# Patient Record
Sex: Female | Born: 1950
Health system: Southern US, Community
[De-identification: ages and names within clinical notes are randomized; demographics above are authoritative.]

## PROBLEM LIST (undated history)

## (undated) DIAGNOSIS — I472 Ventricular tachycardia: Secondary | ICD-10-CM

## (undated) DIAGNOSIS — Q2112 Patent foramen ovale: Secondary | ICD-10-CM

## (undated) DIAGNOSIS — Q211 Atrial septal defect: Secondary | ICD-10-CM

## (undated) DIAGNOSIS — R55 Syncope and collapse: Secondary | ICD-10-CM

## (undated) DIAGNOSIS — I10 Essential (primary) hypertension: Secondary | ICD-10-CM

## (undated) DIAGNOSIS — I4729 Other ventricular tachycardia: Secondary | ICD-10-CM

## (undated) DIAGNOSIS — F0390 Unspecified dementia without behavioral disturbance: Secondary | ICD-10-CM

## (undated) DIAGNOSIS — F329 Major depressive disorder, single episode, unspecified: Secondary | ICD-10-CM

## (undated) DIAGNOSIS — I4581 Long QT syndrome: Secondary | ICD-10-CM

## (undated) DIAGNOSIS — C50919 Malignant neoplasm of unspecified site of unspecified female breast: Secondary | ICD-10-CM

## (undated) DIAGNOSIS — M1990 Unspecified osteoarthritis, unspecified site: Secondary | ICD-10-CM

## (undated) DIAGNOSIS — F32A Depression, unspecified: Secondary | ICD-10-CM

## (undated) DIAGNOSIS — Q245 Malformation of coronary vessels: Secondary | ICD-10-CM

## (undated) DIAGNOSIS — I480 Paroxysmal atrial fibrillation: Secondary | ICD-10-CM

## (undated) HISTORY — PX: MASTECTOMY: SHX3

## (undated) HISTORY — PX: BREAST SURGERY: SHX581

## (undated) HISTORY — PX: ABDOMINAL HYSTERECTOMY: SHX81

## (undated) HISTORY — PX: TOTAL KNEE ARTHROPLASTY: SHX125

## (undated) HISTORY — PX: AUGMENTATION MAMMAPLASTY: SUR837

## (undated) HISTORY — PX: BREAST BIOPSY: SHX20

---

## 1984-02-06 DIAGNOSIS — C50919 Malignant neoplasm of unspecified site of unspecified female breast: Secondary | ICD-10-CM

## 1984-02-06 HISTORY — DX: Malignant neoplasm of unspecified site of unspecified female breast: C50.919

## 2004-06-06 ENCOUNTER — Other Ambulatory Visit: Admission: RE | Admit: 2004-06-06 | Discharge: 2004-06-06 | Payer: Self-pay | Admitting: Family Medicine

## 2004-07-10 ENCOUNTER — Encounter: Admission: RE | Admit: 2004-07-10 | Discharge: 2004-07-10 | Payer: Self-pay | Admitting: Family Medicine

## 2004-10-02 ENCOUNTER — Encounter: Admission: RE | Admit: 2004-10-02 | Discharge: 2004-10-02 | Payer: Self-pay | Admitting: Cardiology

## 2004-10-03 ENCOUNTER — Ambulatory Visit (HOSPITAL_COMMUNITY): Admission: RE | Admit: 2004-10-03 | Discharge: 2004-10-03 | Payer: Self-pay | Admitting: Cardiology

## 2004-10-12 ENCOUNTER — Ambulatory Visit: Payer: Self-pay | Admitting: Cardiovascular Disease

## 2004-10-12 ENCOUNTER — Ambulatory Visit (HOSPITAL_COMMUNITY): Admission: RE | Admit: 2004-10-12 | Discharge: 2004-10-12 | Payer: Self-pay | Admitting: Interventional Cardiology

## 2004-10-13 ENCOUNTER — Emergency Department (HOSPITAL_COMMUNITY): Admission: EM | Admit: 2004-10-13 | Discharge: 2004-10-13 | Payer: Self-pay | Admitting: Emergency Medicine

## 2004-10-23 ENCOUNTER — Ambulatory Visit: Payer: Self-pay | Admitting: Critical Care Medicine

## 2004-11-09 ENCOUNTER — Encounter: Admission: RE | Admit: 2004-11-09 | Discharge: 2005-02-07 | Payer: Self-pay | Admitting: Internal Medicine

## 2005-02-15 ENCOUNTER — Encounter (INDEPENDENT_AMBULATORY_CARE_PROVIDER_SITE_OTHER): Payer: Self-pay | Admitting: *Deleted

## 2005-02-15 ENCOUNTER — Inpatient Hospital Stay (HOSPITAL_COMMUNITY): Admission: RE | Admit: 2005-02-15 | Discharge: 2005-02-17 | Payer: Self-pay | Admitting: Obstetrics and Gynecology

## 2005-04-02 ENCOUNTER — Encounter: Admission: RE | Admit: 2005-04-02 | Discharge: 2005-04-02 | Payer: Self-pay | Admitting: Obstetrics and Gynecology

## 2005-06-13 ENCOUNTER — Encounter: Admission: RE | Admit: 2005-06-13 | Discharge: 2005-06-13 | Payer: Self-pay | Admitting: Obstetrics and Gynecology

## 2005-08-29 ENCOUNTER — Encounter: Admission: RE | Admit: 2005-08-29 | Discharge: 2005-08-29 | Payer: Self-pay | Admitting: Internal Medicine

## 2005-09-07 ENCOUNTER — Encounter: Admission: RE | Admit: 2005-09-07 | Discharge: 2005-09-07 | Payer: Self-pay | Admitting: Internal Medicine

## 2006-01-14 ENCOUNTER — Encounter: Admission: RE | Admit: 2006-01-14 | Discharge: 2006-01-14 | Payer: Self-pay | Admitting: Obstetrics and Gynecology

## 2006-03-15 ENCOUNTER — Encounter: Admission: RE | Admit: 2006-03-15 | Discharge: 2006-03-15 | Payer: Self-pay | Admitting: Obstetrics and Gynecology

## 2006-03-25 ENCOUNTER — Encounter: Admission: RE | Admit: 2006-03-25 | Discharge: 2006-03-25 | Payer: Self-pay | Admitting: *Deleted

## 2006-04-29 ENCOUNTER — Inpatient Hospital Stay (HOSPITAL_COMMUNITY): Admission: EM | Admit: 2006-04-29 | Discharge: 2006-05-02 | Payer: Self-pay | Admitting: Emergency Medicine

## 2006-05-01 ENCOUNTER — Encounter (INDEPENDENT_AMBULATORY_CARE_PROVIDER_SITE_OTHER): Payer: Self-pay | Admitting: Interventional Cardiology

## 2006-11-06 ENCOUNTER — Inpatient Hospital Stay (HOSPITAL_COMMUNITY): Admission: RE | Admit: 2006-11-06 | Discharge: 2006-11-12 | Payer: Self-pay | Admitting: Orthopedic Surgery

## 2006-11-07 ENCOUNTER — Ambulatory Visit: Payer: Self-pay | Admitting: Physical Medicine & Rehabilitation

## 2007-01-15 ENCOUNTER — Encounter: Admission: RE | Admit: 2007-01-15 | Discharge: 2007-01-15 | Payer: Self-pay | Admitting: Obstetrics and Gynecology

## 2007-06-10 ENCOUNTER — Encounter: Admission: RE | Admit: 2007-06-10 | Discharge: 2007-06-10 | Payer: Self-pay | Admitting: Obstetrics and Gynecology

## 2008-06-21 ENCOUNTER — Ambulatory Visit (HOSPITAL_COMMUNITY): Admission: RE | Admit: 2008-06-21 | Discharge: 2008-06-21 | Payer: Self-pay | Admitting: Cardiology

## 2010-02-25 ENCOUNTER — Encounter: Payer: Self-pay | Admitting: Critical Care Medicine

## 2010-02-26 ENCOUNTER — Encounter: Payer: Self-pay | Admitting: Obstetrics and Gynecology

## 2010-02-26 ENCOUNTER — Encounter: Payer: Self-pay | Admitting: Internal Medicine

## 2010-02-27 ENCOUNTER — Encounter: Payer: Self-pay | Admitting: Cardiology

## 2010-06-14 ENCOUNTER — Inpatient Hospital Stay (HOSPITAL_BASED_OUTPATIENT_CLINIC_OR_DEPARTMENT_OTHER): Admission: RE | Admit: 2010-06-14 | Payer: Self-pay | Source: Ambulatory Visit | Admitting: Cardiology

## 2010-06-20 ENCOUNTER — Ambulatory Visit (HOSPITAL_COMMUNITY)
Admission: RE | Admit: 2010-06-20 | Discharge: 2010-06-20 | Disposition: A | Discharge status: Home / Self Care | Source: Ambulatory Visit | Attending: Cardiology | Admitting: Cardiology

## 2010-06-20 DIAGNOSIS — R0789 Other chest pain: Secondary | ICD-10-CM | POA: Insufficient documentation

## 2010-06-20 NOTE — Op Note (Signed)
NAMEBRIGHTON, Castro             ACCOUNT NO.:  [id number]   MEDICAL RECORD NO.:  [id number]          PATIENT TYPE:  INP   LOCATION:  0004                         FACILITY:  [facility]   PHYSICIAN:  Ollen Gross, M.D.    DATE OF BIRTH:  [birth year]   DATE OF PROCEDURE:  11/06/2006  DATE OF DISCHARGE:                               OPERATIVE REPORT   PREOPERATIVE DIAGNOSIS:  Osteoarthritis, bilateral knees.   POSTOPERATIVE DIAGNOSIS:  Osteoarthritis, bilateral knees.   PROCEDURE:  Bilateral total knee arthroplasty.   SURGEON:  Ollen Gross, M.D.   ASSISTANT:  Avel Peace, PA-C   ANESTHESIA:  General with postop epidural.   ESTIMATED BLOOD LOSS:  Minimal.   DRAIN:  Autovac right knee.   TOURNIQUET TIME:  Right knee 30 minutes 300 mmHg, left knee 29 minutes  300 mmHg.   COMPLICATIONS:  None.   CONDITION:  Stable to recovery.   CLINICAL NOTE:  [name] is a 60 year old female with severe end-  stage arthritis both knees.  Both equally symptomatic.  She had had a  progressive increase in pain and decrease in function in the past  several months.  We had attempted nonoperative intervention including  injections which have failed.  Due to significant intractable pain and  functional debility, she presents now for bilateral total knee  arthroplasty.  We discussed doing one at a time versus doing bilateral  and she opted for doing both on the same setting.   PROCEDURE IN DETAIL:  After successful administration of general and  epidural anesthetic, tourniquets were placed on both thighs and both  lower extremities  prepped and draped in the usual sterile fashion.  We  started with the right lower extremity as that historically was more  symptomatic.  The right lower extremity was wrapped in Esmarch, knee  flexed and tourniquet inflated 300 mmHg.  Midline incision is made with  a 10 blade through subcutaneous tissue to the level of the extensor  mechanism.  A fresh blade is  used to make a medial parapatellar  arthrotomy.  Soft tissue over the proximal medial tibia is  subperiosteally elevated to the joint line with the knife and into the  semimembranosus bursa with a Cobb elevator.  Soft tissue laterally is  elevated with attention being paid to avoiding patellar tendon on tibial  tubercle.  Patella subluxed laterally, knee flexed 90 degrees, ACL and  PCL removed.  Drill was used to create a starting hole in the distal  femur and the canal was thoroughly irrigated.  The 5 degree right valgus  alignment guide is placed referencing off the posterior condyles,  rotation is marked and the block pinned to remove 11 mm of distal femur.  I took 11 because of the flexion contracture.  Distal femoral resection  is made with an oscillating saw.  Sizing blocks placed, size 4 is most  appropriate in the AP plane but 3 is most appropriate in the medial  lateral plane.  Marked the rotation for size 4, but used a size 3  cutting block.  Rotations marked off the epicondylar axis.  Size  3  cutting block placed and the anterior-posterior chamfer cuts are made.   The tibia subluxed forward and the menisci are removed.  Extramedullary  tibial alignment guide is placed referencing proximally at the medial  aspect of the tibial tubercle and distally along the second metatarsal  axis and tibial crest.  Block is pinned to remove 10 mm of the non  deficient lateral side.  Tibial resection is made with an oscillating  saw.  Size 3 is the most appropriate tibial component and the proximal  tibia prepared the modular drill and keel punch for size 3.  Femoral  preparation is completed with the intercondylar cut.   Size 3 mobile bearing tibial trial, size 3 posterior stabilized femoral  trial and a 12.5 mm posterior stabilized rotating platform insert trial  are placed.  With the 12.5 full extension is achieved with excellent  varus and valgus balance throughout full range of motion.   The patella  was everted and thickness measured to be 22 mm.  Freehand resection  taken to 13 mm, 38 template is placed, lug holes were drilled, trial  patella was placed and it tracks normally.  Osteophytes were removed off  the posterior femur with the trial in place.  All trials were removed  and the cut bone surfaces are prepared with pulsatile lavage.  Cement  was mixed and once ready for implantation the size 3 mobile bearing  tibial tray, size 3 posterior stabilized femur and 38 patella are  cemented in place.  Patella was held with a clamp.  Trial 12.5-mm  inserts placed, knee held in full extension and all extruded cement  removed.  When the cement was fully hardened, the wound is copiously  irrigated with saline solution.  The trials removed and FloSeal injected  on the posterior capsule.  The permanent 12.5 mm posterior stabilized  rotating platform insert is then placed into the tibial tray.  We then  placed the FloSeal in the medial and lateral gutters in suprapatellar  area and the tourniquet released with total time of 30 minutes.  Moist  sponge is placed and held for about 2 minutes.  It is then removed and  minimal bleeding was encountered.  All bleeding is stopped with  electrocautery.  We irrigated again and closed the extensor mechanism  over Hemovac drain with interrupted #1 PDS.  Flexion against gravity was  135 degrees.  Subcu closed with interrupted 2-0 Vicryl.  The drain is  hooked to the Autovac suction.  Moist sponge is placed and we wrapped  the knee loosely in Esmarch.   I then addressed the left knee.  Left lower extremities wrapped in  Esmarch, knee flexed and tourniquet inflated 300 mmHg.  Same approach is  used for a midline incision.  Medial parapatellar arthrotomy was  performed and soft tissue over the proximal medial tibia is elevated to  the joint line with the knife and into the semimembranosus bursa with a  Cobb elevator.  We subluxed the patella  laterally, removed the ACL and  PCL and used a drill the create a starting hole in the distal femur.  Canal was thoroughly irrigated and a 5 degrees left valgus alignment  guide is placed.  We took 11 mm of the distal femur because of flexion  contracture.  Sizing blocks placed.  We had the same mismatches on the  right.  She was 4 in the AP plane and 3 in the medial lateral plane.  We  thus marked for size 4 holes but did the size 3 cutting block, rotated  it off the epicondylar axis.  The anterior-posterior and chamfer cuts  are made.   Tibia subluxed forward and menisci removed.  With the extramedullary  guide, we placed it to remove 10 mm of the non deficient lateral side.  Tibial resection is made with an oscillating saw.  Size three is most  appropriate tibial component.  Proximal tibia was prepared with a  modular drill and keel punch for size 3 and the femoral preparation is  completed with the intercondylar cut.   Size 3 mobile bearing tibial trial, size 3 posterior stabilized femoral  trial and a 12.5 mm posterior stabilized rotating platform insert trial  are placed.  With the 12/05 full extension is achieved with excellent  varus and valgus balance throughout full range of motion.  Patella was  everted and with the same preparation technique as on the right, we used  a 38 which tracked normally.  Osteophytes were removed off the posterior  femur with the trial in place.  All trials removed and cut bone surfaces  prepared with pulsatile lavage.  Cement was mixed and once ready for  implantation, the size 3 mobile bearing tibial tray, size 3 posterior  stabilized femur and 38 patella are cemented in place.  Patella was held  with a clamp.  Trial 12/05 inserts placed, knee held in full extension  and all extruded cement removed.  When cement fully hardened then the  wound was copiously irrigated saline solution.  FloSeal injected on the  posterior capsule and the permanent 12.5  mm posterior stabilized  rotating platform insert placed into the tibial tray.  The FloSeal  injected in the mediolateral gutters and suprapatellar area.  Moist  sponge is placed and tourniquet released for tourniquet time of 29  minutes.  Minor bleeding stopped with cautery once the sponges removed.  Wounds again irrigated and the arthrotomy closed over the Autovac drain  with interrupted #1 PDS.  The drain was in the inadvertently pulled  later with removal of the drapes.  Once the arthrotomy was closed in  flexion against gravity was 135 degrees.  The subcu closed with  interrupted 2-0 Vicryl subcuticular running 4-0 Monocryl.  We then  closed the subcuticular on the right knee with a 4-0 Monocryl.  Both  incisions cleaned and dried and Steri-Strips and bulky sterile dressings  applied.  She is placed into knee immobilizers, awakened and transferred  to recovery in stable condition.      Ollen Gross, M.D.  Electronically Signed     FA/MEDQ  D:  11/06/2006  T:  11/07/2006  Job:  [id number]

## 2010-06-20 NOTE — H&P (Signed)
Michelle Castro, Michelle Castro             ACCOUNT NO.:  [id number]   MEDICAL RECORD NO.:  [id number]          PATIENT TYPE:  INP   LOCATION:  0004                         FACILITY:  [facility]   PHYSICIAN:  Ollen Gross, M.D.    DATE OF BIRTH:  1951-01-23   DATE OF ADMISSION:  11/06/2006  DATE OF DISCHARGE:                              HISTORY & PHYSICAL   DATE OF OFFICE VISIT HISTORY AND PHYSICAL:  October 31, 2006.   CHIEF COMPLAINT:  Bilateral knee pain.   HISTORY OF PRESENT ILLNESS:  The patient is a 60 year old female whose  been seen by Dr. Lequita Halt in second opinion earlier this year for  bilateral knee pain that has been ongoing for quite some time now. It  has progressively gotten worse over the past year and a half and it is  limiting what she can and cannot do.  She is seen in the office as a  second opinion, found to have end-stage medial compartment arthritis of  both knees, right is slightly worse than the left.  She also has  patellofemoral arthritis noted.  She has been treated conservatively in  the past for her knee pain including injections.  She has been  refractory to conservative measurements. It is felt she would benefit  from undergoing surgical intervention.  The risks and benefits have been  discussed. She has elected to proceed with bilateral total knee  arthroplasties.   ALLERGIES:  No known drug allergies.   INTOLERANCES:  Codeine causes vomiting.   Please note the patient is able to take Percocet and Vicodin   CURRENT MEDICATIONS:  1. Diltiazem 240 mg.  2. Triamterene/hydrochlorothiazide 37.5/25.  3. Generic Ambien 10 mg.  4. Tramadol 50 mg.  5. Potassium chloride 20 mEq.  6. Sertraline 50 mg.  7. Warfarin 5 mg.  8. Metoprolol 25 mg.   PAST MEDICAL HISTORY:  Migraines, anxiety, hypertension, breast cancer,  osteoarthritis, postmenopausal.   PAST SURGICAL HISTORY:  Exploratory surgery, breast surgery secondary to  breast cancer, left breast  mastectomy, right breast biopsy and breast  augmentation procedures.   SOCIAL HISTORY:  Divorced, works in [employer], nonsmoker. Social intake of  alcohol. Four children.  Her daughter will be assisting with care after  surgery.   FAMILY HISTORY:  Father with history of prostate cancer.  Mother with  history of rheumatoid arthritis.  Sister with history of breast cancer.  She has two aunts both with a history of cancer. One of those aunts also  has heart disease. Grandfather with bone cancer.  Grandmother with brain  aneurysm.   REVIEW OF SYSTEMS:  GENERAL:  No fevers, chills or night sweats.  NEUROLOGIC:  No seizures, syncope or paralysis.  RESPIRATORY:  No  shortness of breath, productive cough or hemoptysis.  CARDIOVASCULAR:  No chest pain, angina or orthopnea. GI:  Some constipation related to  medications. No diarrhea, no nausea or vomiting.  GU:  No dysuria,  hematuria or discharge.  MUSCULOSKELETAL:  Bilateral knees.   PHYSICAL EXAMINATION:  VITAL SIGNS:  Pulse 60, respirations 12, blood  pressure 138/78.  GENERAL:  A 60 year old, white  female, well-nourished, well-developed,  in no acute distress.  She is alert, oriented and cooperative, very  pleasant, mildly anxious at time of exam.  HEENT:  Normocephalic, atraumatic.  Pupils are round and reactive.  Oropharynx clear.  EOMs intact.  NECK:  Supple.  CHEST:  Clear anterior and posterior chest walls.  No rhonchi, rales or  wheezing.  HEART:  Regular rhythm with a faint early systolic ejection murmur noted  over the left sternal border.  ABDOMEN:  Soft, slightly round.  Bowel sounds present.  RECTAL/BREASTS/GENITALIA:  Not done not pertinent to present illness.  EXTREMITIES:  Bilateral knees. The left knee shows range of motion of 5-  120, medial more tender than lateral with marked crepitus noted.  Right  knee shows range of motion 0 to 125, marked crepitus, tender medial and  lateral.   IMPRESSION:  1. Osteoarthritis  bilateral knees.  2. Migraines.  3. Anxiety.  4. Hypertension.  5. History of breast cancer.  6. Postmenopausal.   PLAN:  The patient will be admitted to [facility] to undergo  bilateral total knee replacement arthroplasty.  The surgery will be  performed by Dr. Ollen Gross. She has been seen preoperatively by Dr.  Armanda Magic and felt stable and cleared from a cardiac standpoint for  procedure.      [name], P.A.C.      Ollen Gross, M.D.  Electronically Signed    ALP/MEDQ  D:  11/05/2006  T:  11/06/2006  Job:  [id number]   cc:   Armanda Magic, M.D.  Fax: [phone]

## 2010-06-23 NOTE — H&P (Signed)
[name], [name]             ACCOUNT NO.:  [id number]   MEDICAL RECORD NO.:  [id number]          PATIENT TYPE:  EMS   LOCATION:  MAJO                         FACILITY:  [facility]   PHYSICIAN:  Elmore Guise., M.D.DATE OF BIRTH:  [birth year]   DATE OF ADMISSION:  04/29/2006  DATE OF DISCHARGE:                              HISTORY & PHYSICAL   INDICATION FOR ADMISSION:  Chest pain and palpitations.   PRIMARY CARE PHYSICIAN:  Dr. Cline Cools.   PRIMARY CARDIOLOGIST:  Dr. Armanda Magic.   HISTORY OF PRESENT ILLNESS:  [name] is a very pleasant 60-year-  old white female with past medical history of paroxysmal atrial  fibrillation, hypertension, depression who presents for admission.  The  patient actually reports normal state of health until Thursday.  At that  time, she was having paroxysms of atrial fibrillation with her heart  racing and skipping and lasting anywhere from 30 minutes to one hour at  a time.  Other than her palpitations, she had been doing well until  today when she started having episodes of her blood pressure increasing.  She states she would check her blood pressure and sometimes it would be  in the 140/100 range, sometimes in the 170/100.  She took an extra dose  of her triamterene HCTZ and went to her primary physician's office for  evaluation.  Prior to going, she started having substernal chest  tightness which she describes as a pressure and heaviness like a brick  sitting on my chest.  This was associated with palpitations which  worsened with exertion and improved with rest.  This went off and on  until she went to be evaluated.  There she had an ECG performed.  This  was faxed for cardiology over read.  Because of ECG changes, the patient  was then sent to the emergency room for evaluation.  She left her  primary care physician's office somewhere around 4 o'clock, arrived to  the emergency room for evaluation and since has been treated and had  enzymes performed which have been negative.  She will now be admitted  for observation.   REVIEW OF SYSTEMS:  Otherwise negative blood per HPI.   CURRENT MEDICATIONS:  1. Cartia 240 mg daily.  2. Coumadin 5 mg 2 days a week and 7.5 mg 5 days.  3. Triamterene/HCTZ once daily.  4. Zoloft 50 mg daily.  5. Zyrtec once daily.  6. Also a stomach peel..   ALLERGIES:  CODEINE CAUSING NAUSEA AND VOMITING.   FAMILY HISTORY:  Positive for hypertension.   SOCIAL HISTORY:  She is single.  She has four grown children.  No  tobacco.  Does drink occasional alcohol.   PHYSICAL EXAMINATION:  VITAL SIGNS:  She is afebrile.  Blood pressure is  149/87, heart rate 71, showing normal sinus rhythm on telemetry, sat 95%  on room air.  GENERAL:  She is a very pleasant middle-aged white female, alert and  oriented x4.  No acute distress.  She has no JVD and no bruits.  LUNGS:  Clear.  HEART:  Regular with no significant murmur, gallops  or rubs.  ABDOMEN:  Soft, nontender, nondistended.  No rebound or guarding.  EXTREMITIES:  Warm 2+ pulses and no edema.   LABORATORY DATA:  Her blood work shows hemoglobin of 16.3 and INR 2.5,  BUN and creatinine of 28 and 1.2, potassium level of 2.9.  Myoglobin is  78 with an MB of 3.1 and troponin I less than 0.05.  She had a cath in  August2006 showing normal coronaries with possible takeoff of RCA.  This was followed by CT angiogram showing a superior takeoff of a right  dominant system with no significant coronary disease and an EF of 64%.  Her chest x-ray today shows cardiomegaly, but no acute cardiopulmonary  disease.  Her ECG shows normal sinus rhythm with septal Q-waves and  nonspecific ST-T wave changes in her inferolateral leads.  No old EKGs  are available for evaluation.  Her EKG done earlier today is also  unavailable for evaluation.   IMPRESSION:  1. Chest pain.  2. Palpitations.  3. Hypertension.  4. Hypokalemia.   PLAN:  The patient will be  admitted for observation.  She had serial cardiac  enzymes.  Because of her blood pressure issues, we will add low-dose ACE  inhibitor, lisinopril 10 mg once daily.  We will place her potassium.  Further measures per Dr. Armanda Magic.  We will also repeat her EKG in  the morning.  I do wonder if there is a spasm component secondary to her  normal CTA and normal cath done back in 2006.  DE is less likely with  her therapeutic INR, and her symptoms are atypical here for dissection      Elmore Guise., M.D.  Electronically Signed     TWK/MEDQ  D:  04/29/2006  T:  04/30/2006  Job:  [id number]   cc:   Armanda Magic, M.D.

## 2010-06-23 NOTE — Cardiovascular Report (Signed)
Michelle Castro, Michelle Castro             ACCOUNT NO.:  [id number]   MEDICAL RECORD NO.:  [id number]          PATIENT TYPE:  OIB   LOCATION:  2853                         FACILITY:  [facility]   PHYSICIAN:  Armanda Magic, M.D.     DATE OF BIRTH:  1950/06/02   DATE OF PROCEDURE:  10/03/2004  DATE OF DISCHARGE:  10/03/2004                              CARDIAC CATHETERIZATION   REFERRING PHYSICIAN:  Dr. Madison Hickman.   PROCEDURE:  1.  Left heart catheterization.  2.  Coronary angiography.  3.  Left ventriculography.   OPERATOR:  Armanda Magic, M.D.   INDICATIONS:  Chest pain, abnormal EKG, ST depression with chest pain.   COMPLICATIONS:  None.   IV ACCESS:  Via right femoral artery 6-French sheath.   INDICATION:  This is a very pleasant 60 year old white female who presented  with complaints of episodic exertional chest pain and palpitations, and wore  an event monitor which showed paroxysmal atrial fibrillation.  During  several of the episodes of chest pain though, she was in sinus rhythm and  would have transient horizontal ST-segment depression of at least 3 mm.  She  now presents for cardiac catheterization.   DESCRIPTION OF PROCEDURE:  The patient was brought to cardiac  catheterization laboratory in a fasting non-sedated state.  Informed consent  was obtained.  The patient was connected to continuous heart rate and pulse  oximetry monitoring and intermittent blood pressure monitoring.  The right  groin was prepped and draped in a sterile fashion.  1% Xylocaine was used  for local anesthesia.  Using the modified Seldinger technique, a 6-French  sheath was placed in the right femoral artery.  Under fluoroscopic guidance,  a 6-French JL4 catheter was placed in the left coronary artery.  Multiple  cine films were taken in 30-degree RAO and 40-degree LAO views.  This  catheter was then exchanged out over a guidewire for a 6-French JR4 catheter  which was placed under fluoroscopic  guidance in the right coronary artery.  Multiple cine films were taken in 30-degree RAO and 40-degree LAO views.  This catheter was then exchanged out over a guidewire for 6-French angled  pigtail catheter, which was placed under fluoroscopic guidance into the left  ventricular cavity.  Left ventriculography was performed in 30-degree RAO  view using a total of 30 mL of contrast at 15 mL per second.  The catheter  was then pulled back across the aortic valve with no significant gradient  noted.  At the end of the procedure, all catheters and sheaths were removed.  Manual compression was performed until adequate hemostasis was obtained.  The patient was transferred back to her room in stable condition.   RESULTS:  The left main coronary artery is widely patent and bifurcates into  the left anterior descending artery and left circumflex artery.  The left  anterior descending artery is widely patent throughout its course to the  apex, giving rise to 2 diagonal branches, both of which are widely patent.   The left circumflex is widely patent throughout its course, giving rise to 2  obtuse marginal branches,  both of which are widely patent.  The ongoing left  circumflex traverses the AV groove and is widely patent.   The right coronary artery originates off the left coronary cusp, but is  widely patent and bifurcates into a posterior descending artery and  posterior lateral artery, both of which are widely patent.   LV function shows normal LV systolic function with mild MR, aortic pressure  147/81 mmHg, LV pressure 139/71 mmHg.   ASSESSMENT:  1.  Normal coronary arteries with aberrant takeoff of the right coronary      artery off the left coronary cusp.  2.  Normal left ventricular function.  3.  Chest pain with questionable coronary vasospasm.  The patient did have 3      mm of horizontal ST segment depression with chest pain on the event      monitor.  She does have a history of  migraine headaches.   PLAN:  1.  Discharge to home after IV fluid and bedrest.  Start Coumadin 5 mg a day      for paroxysmal A fib.  She has a higher risk of cardioembolic events      with PAF, given her underlying hypertension, despite her young age.  2.  Coumadin Clinic next Tuesday, October 10, 2004.  3.  Start Cardizem CD 180 mg a day to suppress atrial fibrillation and      possible vasospasm.  4.  Follow up with me in 2 weeks.  5.  We are also going to obtain an outpatient CT angiogram of the chest to      rule out aberrant course of the right coronary artery between the aorta      and the pulmonary artery which could account for angina as well.  This      will be scheduled next week.      Armanda Magic, M.D.  Electronically Signed     TT/MEDQ  D:  10/04/2004  T:  10/04/2004  Job:  [id number]

## 2010-06-23 NOTE — Discharge Summary (Signed)
Michelle Castro             ACCOUNT NO.:  [id number]   MEDICAL RECORD NO.:  [id number]          PATIENT TYPE:  INP   LOCATION:  6533                         FACILITY:  [facility]   PHYSICIAN:  Armanda Magic, M.D.     DATE OF BIRTH:  11-17-1950   DATE OF ADMISSION:  04/29/2006  DATE OF DISCHARGE:  05/01/2006                               DISCHARGE SUMMARY   DISCHARGE DIAGNOSES:  1. Palpitations, resolved.  2. Potential premature ventricular contraction.  3. Hypertension, treated.  4. Hypokalemia, repleted.  5. Long-term medication use.   HISTORY OF PRESENT ILLNESS:  [name] is a 60 year old-female who  was admitted to [facility] on April 29, 2006, with chest pain, flush  palpitations.  She had a previous cardiac catheterization in 2006, that  showed normal coronaries.  On admission, she was noted to be hypokalemic  with potassium of 2.9.   Once her potassium was repleted she felt better and she had had some  PVCs and short bursts of NSVT/4 beats, but these symptoms regressed once  her potassium was repleted.   The patient had been on Maxzide at home and over the past couple of days  she had doubled her Maxzide dose because her blood pressure was elevated  when she checked it.  It was [place] and she did not want to go to the  doctor.   She is now being discharged to home with stable laboratory work that  shows a potassium of 4.2, BUN 17, creatinine 0.8.  Of note, her TSH was  3.206 and magnesium was 2.1.  Her cardiac exoenzymes were negative.   DISCHARGE MEDICATIONS:  Include:  1. Continuing Coumadin as prior to admission.  2. Cardia 240 mg a day.  3. Maxzide 1 tablet daily.  4. Zoloft 50 mg daily.  5. Zyrtec p.r.n.  6. Vitamins daily.   MEDICATIONS:  1. Potassium 20 mEq one tablet a day.  2. Prinvil 10 mg one tablet a day.   ACTIVITY:  Increase activity slowly.   DIET:  Remain on a heart healthy diet.   PLAN:  Call for any further palpitations.  She or to  followup with Dr.  Armanda Magic on May 16, 2006, at 11 a.m.  She is to go to the  laboratory for stat BMET prior to this appointment.      Guy Franco, P.A.      Armanda Magic, M.D.  Electronically Signed    LB/MEDQ  D:  05/01/2006  T:  05/01/2006  Job:  [id number]   cc:   Armanda Magic, M.D.

## 2010-06-23 NOTE — Discharge Summary (Signed)
NAMEKINSIE, BELFORD             ACCOUNT NO.:  [id number]   MEDICAL RECORD NO.:  [id number]          PATIENT TYPE:  INP   LOCATION:  9306                          [facility]  [facility]   PHYSICIAN:  Gerald Leitz, MD          DATE OF BIRTH:  30-Mar-1950   DATE OF ADMISSION:  02/15/2005  DATE OF DISCHARGE:  02/17/2005                                 DISCHARGE SUMMARY   INDICATION FOR ADMISSION:  Symptomatic uterine fibroids, menorrhagia.   POSTOPERATIVE DIAGNOSIS:  Symptoms uterine fibroids, menorrhagia.   PROCEDURE:  Transvaginal hysterectomy with conversion to abdominal  laparotomy and bilateral salpingo-oophorectomy.   BRIEF HOSPITAL COURSE:  The patient was admitted on February 15, 2005 and  underwent a transvaginal hysterectomy which converted to an abdominal  laparotomy due to bleeding. Bilateral salpingo-oophorectomy was completed  through the laparotomy incision. The patient did well postoperatively. She  received routine postoperative care. She was discharged home on February 17, 2005 on the following medications. Discharge hemoglobin is 8.7.   DISCHARGE MEDICATIONS:  Motrin and Percocet.   CONDITION ON DISCHARGE:  Stable.   ACTIVITY:  Pelvic rest, otherwise ad lib.   FOLLOW UP:  To follow up in 2 weeks for postoperative visit, and in 3 days  for staple removal.   DIET:  Regular.      Gerald Leitz, MD  Electronically Signed     TC/MEDQ  D:  03/08/2005  T:  03/08/2005  Job:  [id number]

## 2010-06-23 NOTE — Op Note (Signed)
Michelle Castro, Michelle Castro             ACCOUNT NO.:  [id number]   MEDICAL RECORD NO.:  [id number]          PATIENT TYPE:  OBV   LOCATION:  9306                          [facility]   PHYSICIAN:  Gerald Leitz, MD          DATE OF BIRTH:  [birth year]   DATE OF PROCEDURE:  02/15/2005  DATE OF DISCHARGE:                                 OPERATIVE REPORT   PREOPERATIVE DIAGNOSES:  1.  Symptomatic uterine fibroids.  2.  Menorrhagia.   POSTOPERATIVE DIAGNOSES:  1.  Symptomatic uterine fibroids.  2.  Menorrhagia.   PROCEDURE:  Transvaginal hysterectomy with conversion to abdominal  laparotomy and bilateral salpingo-oophorectomy.   SURGEON:  Gerald Leitz, M.D.   ASSISTANT:  Bing Neighbors. Sydnee Cabal, M.D.   ANESTHESIA:  General.   COMPLICATIONS:  Complex left ovarian cyst with bleeding of the ovarian  pedicle that bled from the vaginal approach, could not be isolated and  repaired vaginally; therefore, there was conversion to an abdominal  laparotomy with a bilateral salpingo-oophorectomy and control of bleeding.   SPECIMENS:  Uterus, bilateral fallopian tubes and ovaries.   ESTIMATED BLOOD LOSS:  550 mL.   FINDINGS:  There was diffusely enlarged uterus, approximately 10 cm in size.  The left ovary appeared to be complex and have a complex cyst.  The right  ovary appeared normal.  All specimens were sent to pathology.   PROCEDURE:  The risks, benefits, indications and alternatives of the  procedure were reviewed with the patient.  Informed consent was obtained.  The patient was taken to the operating room, where she was placed under  general anesthesia.  She was placed in the dorsal lithotomy position,  prepped and draped in the usual sterile fashion.  A weighted speculum was  placed into the vagina and the cervix was grasped with a toothed tenaculum.  The cervix was then injected circumferentially with 1% Xylocaine with  1:100,000 epinephrine.  The cervix was then circumferentially incised  with a  scalpel and the bladder dissected off the pubovesical cervical fascia  anteriorly with the Metzenbaum scissors.  Attention was turned to the  posterior cul-de-sac, which was entered sharply with Mayo scissors without  difficulty.  At this point a Heaney clamp was placed over the uterosacral  ligament on each side.  These were then transected and suture ligated with 0  Vicryl.  Excellent hemostasis was noted.  Attention was returned to the  anterior cul-de-sac, which was entered sharply without difficulty.  The  cardinal ligaments were then clamped on both sides, transected and suture  ligated in a similar fashion.  The uterine arteries and the broad ligament  were then serially clamped with Heaney clamps, transected and suture ligated  on both sides.  Excellent hemostasis was noted.  An attempt was made to  clamp the left cornu with a Heaney clamp.  At this point the utero-ovarian  ligament became detached and retracted into the abdomen.  Attempts were made  to visualize the pedicle.  The ovary was grasped with the Babcock clamp.  It  was noted to be complex.  Bleeding was noted to be coming from a superior  location further up the infundibulopelvic ligament; however, this could not  be visualized vaginally despite several attempts.  Due to the bleeding and  lack of visualization, a decision was made to convert to abdominal  laparotomy to control hemostasis and then to remove the ovary bilaterally.  All instruments were removed from the patient's vagina.  Attention was then  turned to the abdomen, where a Pfannenstiel skin incision was made with a  scalpel and carried down to the underlying layer of fascia.  The fascia was  incised in the midline and the incision was extended laterally with Mayo  scissors.  The superior aspect of the fascial incision was grasped with  Kocher clamps, elevated and the underlying rectus muscles dissected off.  This was repeated on the inferior aspect  of the fascial incision.  The  peritoneum was identified and entered bluntly with good visualization of the  bladder.  This incision was extended superiorly and inferiorly with a  Metzenbaum scissors.  The Balfour retractor was placed and the bowel was  packed away with  moist laparotomy sponges.  The left ovary was identified  and noted to be bleeding from the infundibulopelvic ligament.  The ligament  was then clamped.  The ovary and fallopian tube were removed.  The  infundibulopelvic ligament was suture ligated with a free tie of 0 Vicryl,  followed by suture ligature.  At this point hemostasis was maintained.  Attention was turned to the right ovarian pedicle.  The right ovary was  visualized, infundibulopelvic ligament was clamped.  The right ovary and  fallopian tube were removed and the infundibulopelvic ligament was suture  ligated with a free tie of 0 Vicryl followed by a suture ligature of 0  Vicryl.  Attention was turned to the vaginal cuff.  The vaginal cuff angles  were closed with 0 Vicryl and transfixed to the uterosacral ligaments.  This  was done bilaterally.  The remainder of the cuff was closed with a series of  interrupted 0 Vicryl figure-of-eight sutures.  The pelvis was irrigated  copiously with warm normal saline.  There was slight bleeding at the vaginal  cuff, which was repaired with 2-0 Vicryl.  Excellent hemostasis was then  noted.  All laparotomy sponges and instruments were removed from the  abdomen.  The fascia was closed with 0 PDS in a running fashion.  The skin  was closed with staples.  Sponge, lap, needle and instrument counts were  correct x2.  The patient was taken to the recovery room awake and in stable  condition.      Gerald Leitz, MD  Electronically Signed     TC/MEDQ  D:  02/15/2005  T:  02/16/2005  Job:  [id number]

## 2010-06-23 NOTE — Discharge Summary (Signed)
[name], [name]             ACCOUNT NO.:  [id number]   MEDICAL RECORD NO.:  [id number]          PATIENT TYPE:  INP   LOCATION:  1605                         FACILITY:  [facility]   PHYSICIAN:  Ollen Gross, M.D.    DATE OF BIRTH:  16-May-1950   DATE OF ADMISSION:  11/06/2006  DATE OF DISCHARGE:  11/12/2006                               DISCHARGE SUMMARY   ADMITTING DIAGNOSES:  1. Osteoarthritis bilateral knees.  2. Migraines.  3. Anxiety.  4. Hypertension.  5. History breast cancer.  6. Postmenopausal.   DISCHARGE DIAGNOSES:  1. Postop blood loss anemia.  2. Postop hypokalemia improved.   1. Osteoarthritis bilateral knees.  2. Migraines.  3. Anxiety.  4. Hypertension.  5. History breast cancer.  6. Postmenopausal.   PROCEDURE:  November 06, 2006, bilateral total knee.  Surgeon Dr. Lequita Halt,  assistant Avel Peace PA-C.  General:  Postop epidural.  Tourniquet  time:  Right knee 30 minutes, left knee 29 minutes.   CONSULTS:  Dr. Ellwood Dense, Rehab Services.   BRIEF HISTORY:  Michelle Castro is a 60 year old female with end-stage  arthritis of both knees, but equally symptomatic, progressive pain  dysfunction, now presents for total knee arthroplasties.   LABORATORY DATA:  Preop CBC hemoglobin 12.4, hematocrit 36.5, white cell  count 6.8 down to 10.5, got as low as 8.5, drifted down a little bit  further to 8.1, came back up to 8.5.  Last H&H 8.4 and 24.1.  PT/PTT  preop 13.7 and 30, respectively.  INR 1.0.  Serial protimes follows:  PT/INR 18.6 and 1.5.  Chem panel on admission all within normal limits.  Serial B-mets were followed.  Potassium did drop 4.1-3.4 back up to 4.0.  Remaining B-mets within normal limits.  Preop UA negative.  Blood type A  negative.   EKG:  April 29, 2006, normal sinus rhythm, possible left atrial  enlargement, left axis deviation, septal infarct age of undetermined,  marked ST abnormality, possible inferior septal endocardial injury,  confirmed by Dr. Dione Booze.  No old tracing to compare.   HOSPITAL COURSE:  The patient was admitted to [facility],  tolerated procedure well, later transferred to recovery room on  orthopedic floor.  Started on epidural for anesthesia for postop pain.  She was given PCA also for breakthrough pain.  We were going to start  Coumadin on the evening of day #1, and her Lovenox 4-6 hours after the  epidural was to come out.  Started back on her home medications.  Pretty  decent control on day #1, actually doing very well.  By day #2,  unfortunately the epidural catheter came out around lunch time on day #1  and continue with the PCA and p.o. analgesics.  By day #2, she was still  doing pretty well with her pain control.  We added OxyContin and started  her Lovenox the evening before, since the epidural had been out over 6  hours, kept Lovenox bridging until the INR was therapeutic, kept her PCA  and the Foley on the morning of day #2, started getting up out of bed  and slowly progressing with physical therapy.  We ordered a rehab  consult.  The patient was seen in consultation by Dr. Ellwood Dense.  They felt with monitor that possibly would be able to progress and  possibly would not need inpatient rehab, but they did follow along.  From a therapy standpoint, started getting up out of bed, slowly  progressed, walking about 40 feet on day #2, and then up to 100 feet on  day #3.  Dressings changed on day #2 and both incisions were healing  very well, no signs of infection.  Continued to receive therapy daily  basis throughout the weekend with progressive performance by November 11, 2006, the following Monday, her hemoglobin was low, but she was  asymptomatic with this.  It got as low as 8.1 but was back up to 8.4.  She was not quite therapeutic on her INR, so we kept her another day to  monitor her symptoms.  By the following day her INR was improving.  She  was tolerating her  medications.  Her hemoglobin had stabilized.  Both  incisions looked good.  She was discharged home on November 12, 2006.   DISCHARGE/PLAN:  1. The patient was discharged home on November 12, 2006.  For discharge      diagnoses please see above.  2. Discharge medications:  Coumadin, OxyContin, Percocet, Robaxin,      Lovenox, Nu-Iron.   DISCHARGE INSTRUCTIONS:  1. Diet as tolerated.  2. Activity: She is weightbearing as tolerated both lower extremities.      Home Health PT and Home Health nursing.  Total knee protocol.      Follow-up 2 weeks from surgery.   DISPOSITION:  Home.   CONDITION ON DISCHARGE:  Improving.      [name], P.A.C.      Ollen Gross, M.D.  Electronically Signed    ALP/MEDQ  D:  11/26/2006  T:  11/27/2006  Job:  [id number]   cc:   Ollen Gross, M.D.  Fax: [phone]   Armanda Magic, M.D.  Fax: [phone]   Ellwood Dense, M.D.  Fax: [phone]

## 2010-06-23 NOTE — H&P (Signed)
Michelle Castro, Michelle Castro             ACCOUNT NO.:  [id number]   MEDICAL RECORD NO.:  [id number]          PATIENT TYPE:  AMB   LOCATION:  SDC                           [facility]ACILITY[facility]   PHYSICIAN:  Gerald Leitz, MD          DATE OF BIRTH:  [birth year]   DATE OF ADMISSION:  DATE OF DISCHARGE:                                HISTORY & PHYSICAL   She is scheduled for surgery on February 16, 2004.   HISTORY OF CURRENT ILLNESS:  This a 60 year old G4, P4, who was initially  referred by Dr. Madison Hickman at [facility] for the evaluation of  irregular vaginal bleeding.  The patient has been on Coumadin therapy for  atrial fibrillation for the past six weeks.  She was treated with Provera,  which initially stopped her bleeding, but she has since had very heavy  bleeding.  Had an ultrasound, and is noted to have uterine fibroids, as well  as a thickened endometrium.  She has had intermenstrual bleeding and an  endometrial biopsy was done.  Those results are pending at the time of this  dictation.  She is anemic with a hemoglobin of 10.3 on February 07, 2005, and  has had to change her pad every hour for approximately four days straight  feeling very weak and tired.  She desires definitive therapy.   PAST OBSTETRICS/GYNECOLOGY HISTORY:  Menarche at the age of 54.  Contraception BTL in 1977.  History of chlamydia treated years ago.  No  history of abnormal Pap smears.  Last Pap smear was normal was in the last  year.   PAST MEDICAL HISTORY:  1.  Atrial fibrillation treated by Dr. Mayford Knife at [facility].  2.  Hypertension.  3.  Arthritis.  4.  History of left breast cancer approximately 36 years ago.   PAST SURGICAL HISTORY:  Left breast mastectomy, left breast reconstruction,  right breast biopsy, diagnostic laparoscopy in 1986, tubal ligation in 1977.   OBSTETRIC HISTORY:  Spontaneous vaginal delivery x4.   SOCIAL HISTORY:  The patient works for [employer].  She is currently divorced.  She  denies tobacco or alcohol use.  No illicit drug use.   FAMILY HISTORY:  Sister with breast cancer diagnosed at 61.   MEDICATIONS:  Coumadin for atrial fibrillation, which she has discontinued  due to surgery.   ALLERGIES:  NO KNOWN DRUG ALLERGIES.   REVIEW OF SYSTEMS:  Negative, except as stated in history of current  illness.   PHYSICAL EXAMINATION:  VITAL SIGNS:  Blood pressure 120/76, heart rate 84.  CARDIOVASCULAR:  Regular rate and rhythm.  LUNGS:  Clear to auscultation bilaterally.  ABDOMEN:  Soft, nontender, and nondistended.  Positive bowel sounds.  No  masses appreciated.  EXTREMITIES:  No clubbing, cyanosis, or edema.  PELVIC EXAMINATION:  Normal external female genitalia.  No vulvar or  vaginal, or cervical lesions are noted.  There is a slight amount of blood  in the vaginal vault.  Bimanual exam, reveals approximately a 10 weeks' size  uterus.  No adnexal masses or tenderness.  RECTAL EXAMINATION:  Confirms.   ASSESSMENT AND PLAN:  Symptomatic uterine fibroids and menorrhagia.  All  options were discussed with the patient.  She desires to proceed with  transvaginal hysterectomy and bilateral salpingo-oophorectomy.  The risk,  benefits, and alternatives to surgery were discussed including infection,  bleeding, damage to surrounding organs such as the bowel and bladder with  the need for further surgery.  Risk of transfusion was discussed as well,  including HIV, hepatitis B, C, and transfusion reactions.  The patient  understands all the risk and wishes to proceed.      Gerald Leitz, MD  Electronically Signed     TC/MEDQ  D:  02/08/2005  T:  02/08/2005  Job:  [id number]   cc:   [facility]   Pre-Admissions Testing

## 2010-06-27 NOTE — Cardiovascular Report (Signed)
NAME:  LULA, KOLTON NO.:  [id number]  MEDICAL RECORD NO.:  [id number]           PATIENT TYPE:  O  LOCATION:  [facility]                         FACILITY:  [facility]  PHYSICIAN:  Armanda Magic, M.D.     DATE OF BIRTH:  [birth year]  DATE OF PROCEDURE:  06/20/2010 DATE OF DISCHARGE:  06/20/2010                           CARDIAC CATHETERIZATION   PROCEDURE:  Left heart catheterization, coronary angiography, left ventriculography.  OPERATOR:  Armanda Magic, MD  INDICATIONS:  Chest pain.  COMPLICATIONS:  None.  IV ACCESS:  Via right femoral artery 5-French sheath.  IV MEDICATIONS:  Versed 1 mg, fentanyl 25 mcg.  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  She now presents for cardiac catheterization.  The patient is brought to cardiac catheterization laboratory in fasting nonsedated state.  Informed consent was obtained.  The patient was connected to continuous heart rate and pulse oximetry monitoring and intermittent blood pressure monitoring.  The patient was sedated with 1 mg of Versed and 25 mcg of fentanyl.  The right groin was prepped and draped in sterile fashion.  Xylocaine 1% was used for local anesthesia. Using modified Seldinger technique, a 5-French sheath was placed in right femoral artery.  Under fluoroscopic guidance, a 5-French JL-4 catheter was placed in left coronary artery.  Multiple cine films were taken at 30-degree RAO and 40-degree LAO views.  This catheter was exchanged out over a guidewire for a 5-French JR-4 catheter which successfully engaged the right coronary ostium.  Multiple cine films were taken at 30-degree RAO and 40-degree LAO views.  This  catheter was then exchanged out over a guidewire for a 5-French angled pigtail catheter which was placed under fluoroscopic guidance in left ventricular cavity.  Left ventriculography was performed in a 30-degree RAO view using total 25 mL of contrast at 12 mL per second.  Catheter was then pulled back across the aortic valve with no significant gradient noted.  At the end of the procedure, all catheters and sheaths were removed.  Manual compression was performed until adequate hemostasis was obtained.  The patient was transferred back to room in stable condition.  RESULTS: 1. The left main coronary artery is widely patent and bifurcates into     left anterior descending artery and left circumflex artery.  1. Left anterior descending artery is widely patent throughout its     course at the apex.  It gives rise to a moderate-sized first     diagonal branch which was widely patent and a moderate to large     size second diagonal branch which bifurcates in 2 daughter branches     and is widely patent.  1. The left circumflex is widely patent throughout its course in the     AV groove.  It gives rise to a very high obtuse marginal 1 branch  which is large and widely patent.  It gives rise to a second obtuse     marginal branch which is moderate in size and widely patent and     terminates and a third obtuse marginal branch which is widely     patent.  1. The right coronary artery is widely patent throughout its course     and distally bifurcates into posterior descending artery and     posterolateral artery both of which are widely patent.  There is an     anterior takeoff to the right coronary artery off the right     coronary artery cusp.  Left ventriculography shows normal LV function, EF 55%, aortic pressure 121/69 mmHg, LV pressure 108/7 mmHg, LVEDP 10 mmHg.  ASSESSMENT: 1. Normal coronary arteries. 2. Normal left ventricular function. 3. Noncardiac chest pain.  PLAN:   Discharge home after IV fluid bedrest, will follow up with my nurse practitioner in 2 weeks in my office.     Armanda Magic, M.D.     TT/MEDQ  D:  06/20/2010  T:  06/20/2010  Job:  [id number]  Electronically Signed by Armanda Magic M.D. on 06/27/2010 01:41:14 PM

## 2010-11-16 LAB — HEMOGLOBIN AND HEMATOCRIT, BLOOD: HCT: 24.5 — ABNORMAL LOW

## 2010-11-16 LAB — TYPE AND SCREEN: ABO/RH(D): A NEG

## 2010-11-16 LAB — CBC
HCT: 24.1 — ABNORMAL LOW
HCT: 27.8 — ABNORMAL LOW
HCT: 29.3 — ABNORMAL LOW
HCT: 36.5
Hemoglobin: 8.4 — ABNORMAL LOW
Hemoglobin: 12.4
MCHC: 33.8
MCHC: 34.2
MCHC: 34.3
MCHC: 35.7
MCV: 84.3
MCV: 84.7
MCV: 84.8
MCV: 85.5
MCV: 84.7
Platelets: 245
Platelets: 246
Platelets: 411 — ABNORMAL HIGH
RBC: 3.47 — ABNORMAL LOW
RDW: 14.2 — ABNORMAL HIGH
RDW: 14.2 — ABNORMAL HIGH
RDW: 14.6 — ABNORMAL HIGH
RDW: 14.6 — ABNORMAL HIGH
WBC: 5.5
WBC: 6.8

## 2010-11-16 LAB — BASIC METABOLIC PANEL
BUN: 10
BUN: 8
BUN: 9
CO2: 24
CO2: 30
CO2: 30
Calcium: 8.7
Chloride: 100
Chloride: 99
Creatinine, Ser: 0.81
Creatinine, Ser: 1
GFR calc Af Amer: >60
GFR calc Af Amer: >60
GFR calc non Af Amer: 56 — ABNORMAL LOW
Glucose, Bld: 106 — ABNORMAL HIGH
Glucose, Bld: 97
Potassium: 3.4 — ABNORMAL LOW
Potassium: 3.7

## 2010-11-16 LAB — PROTIME-INR
INR: 1.1
INR: 1.1
INR: 1
Prothrombin Time: 14
Prothrombin Time: 14.2
Prothrombin Time: 16.8 — ABNORMAL HIGH
Prothrombin Time: 13.7

## 2010-11-16 LAB — URINALYSIS, ROUTINE W REFLEX MICROSCOPIC
Glucose, UA: NEGATIVE
Hgb urine dipstick: NEGATIVE
Protein, ur: NEGATIVE
pH: 7

## 2010-11-16 LAB — COMPREHENSIVE METABOLIC PANEL
Alkaline Phosphatase: 81
BUN: 12
CO2: 31
Chloride: 102
Creatinine, Ser: 1
GFR calc non Af Amer: 57 — ABNORMAL LOW
Glucose, Bld: 99
Potassium: 4.1
Total Bilirubin: 0.8

## 2010-11-16 LAB — APTT: aPTT: 30

## 2010-11-20 ENCOUNTER — Emergency Department (HOSPITAL_COMMUNITY)
Admission: EM | Admit: 2010-11-20 | Discharge: 2010-11-20 | Disposition: A | Discharge status: Home / Self Care | Attending: Emergency Medicine | Admitting: Emergency Medicine

## 2010-11-20 ENCOUNTER — Emergency Department (HOSPITAL_COMMUNITY)

## 2010-11-20 DIAGNOSIS — R279 Unspecified lack of coordination: Secondary | ICD-10-CM | POA: Insufficient documentation

## 2010-11-20 DIAGNOSIS — Z853 Personal history of malignant neoplasm of breast: Secondary | ICD-10-CM | POA: Insufficient documentation

## 2010-11-20 DIAGNOSIS — R42 Dizziness and giddiness: Secondary | ICD-10-CM | POA: Insufficient documentation

## 2010-11-20 DIAGNOSIS — I10 Essential (primary) hypertension: Secondary | ICD-10-CM | POA: Insufficient documentation

## 2010-11-20 DIAGNOSIS — Z79899 Other long term (current) drug therapy: Secondary | ICD-10-CM | POA: Insufficient documentation

## 2010-11-20 DIAGNOSIS — I4891 Unspecified atrial fibrillation: Secondary | ICD-10-CM | POA: Insufficient documentation

## 2010-11-20 DIAGNOSIS — Z7901 Long term (current) use of anticoagulants: Secondary | ICD-10-CM | POA: Insufficient documentation

## 2010-11-20 LAB — COMPREHENSIVE METABOLIC PANEL
ALT: 28 U/L (ref 0–35)
Alkaline Phosphatase: 101 U/L (ref 39–117)
GFR calc Af Amer: 64 mL/min — ABNORMAL LOW (ref >90)
Glucose, Bld: 118 mg/dL — ABNORMAL HIGH (ref 70–99)
Potassium: 4.2 mEq/L (ref 3.5–5.1)
Sodium: 139 mEq/L (ref 135–145)
Total Protein: 7.8 g/dL (ref 6.0–8.3)

## 2010-11-20 LAB — CBC
HCT: 37.1 % (ref 36.0–46.0)
Hemoglobin: 12.8 g/dL (ref 12.0–15.0)
MCH: 29.7 pg (ref 26.0–34.0)
MCHC: 34.5 g/dL (ref 30.0–36.0)
MCV: 86.1 fL (ref 78.0–100.0)

## 2010-11-20 LAB — GLUCOSE, CAPILLARY

## 2011-10-04 ENCOUNTER — Other Ambulatory Visit: Payer: Self-pay | Admitting: Family Medicine

## 2011-10-04 DIAGNOSIS — Z853 Personal history of malignant neoplasm of breast: Secondary | ICD-10-CM

## 2011-10-04 DIAGNOSIS — R921 Mammographic calcification found on diagnostic imaging of breast: Secondary | ICD-10-CM

## 2011-12-07 ENCOUNTER — Ambulatory Visit
Admission: RE | Admit: 2011-12-07 | Discharge: 2011-12-07 | Disposition: A | Discharge status: Home / Self Care | Source: Ambulatory Visit | Attending: Family Medicine | Admitting: Family Medicine

## 2011-12-07 ENCOUNTER — Other Ambulatory Visit: Payer: Self-pay | Admitting: Family Medicine

## 2011-12-07 DIAGNOSIS — Z853 Personal history of malignant neoplasm of breast: Secondary | ICD-10-CM

## 2011-12-07 DIAGNOSIS — R921 Mammographic calcification found on diagnostic imaging of breast: Secondary | ICD-10-CM

## 2012-02-06 DIAGNOSIS — I4581 Long QT syndrome: Secondary | ICD-10-CM

## 2012-02-06 HISTORY — DX: Long QT syndrome: I45.81

## 2012-02-29 ENCOUNTER — Inpatient Hospital Stay (HOSPITAL_COMMUNITY)
Admission: AD | Admit: 2012-02-29 | Discharge: 2012-03-06 | DRG: 227 | Disposition: A | Discharge status: Home / Self Care | Source: Ambulatory Visit | Attending: Cardiology | Admitting: Cardiology

## 2012-02-29 ENCOUNTER — Telehealth: Payer: Self-pay | Admitting: Cardiology

## 2012-02-29 ENCOUNTER — Encounter (HOSPITAL_COMMUNITY): Payer: Self-pay | Admitting: General Practice

## 2012-02-29 DIAGNOSIS — I519 Heart disease, unspecified: Secondary | ICD-10-CM

## 2012-02-29 DIAGNOSIS — G43909 Migraine, unspecified, not intractable, without status migrainosus: Secondary | ICD-10-CM | POA: Diagnosis present

## 2012-02-29 DIAGNOSIS — F329 Major depressive disorder, single episode, unspecified: Secondary | ICD-10-CM | POA: Diagnosis present

## 2012-02-29 DIAGNOSIS — I4729 Other ventricular tachycardia: Principal | ICD-10-CM | POA: Diagnosis present

## 2012-02-29 DIAGNOSIS — I119 Hypertensive heart disease without heart failure: Secondary | ICD-10-CM | POA: Diagnosis present

## 2012-02-29 DIAGNOSIS — I472 Ventricular tachycardia, unspecified: Principal | ICD-10-CM | POA: Diagnosis present

## 2012-02-29 DIAGNOSIS — Q2111 Secundum atrial septal defect: Secondary | ICD-10-CM

## 2012-02-29 DIAGNOSIS — R55 Syncope and collapse: Secondary | ICD-10-CM

## 2012-02-29 DIAGNOSIS — Q211 Atrial septal defect: Secondary | ICD-10-CM

## 2012-02-29 DIAGNOSIS — Z853 Personal history of malignant neoplasm of breast: Secondary | ICD-10-CM

## 2012-02-29 DIAGNOSIS — F3289 Other specified depressive episodes: Secondary | ICD-10-CM | POA: Diagnosis present

## 2012-02-29 DIAGNOSIS — F411 Generalized anxiety disorder: Secondary | ICD-10-CM | POA: Diagnosis present

## 2012-02-29 DIAGNOSIS — Z79899 Other long term (current) drug therapy: Secondary | ICD-10-CM

## 2012-02-29 DIAGNOSIS — I4891 Unspecified atrial fibrillation: Secondary | ICD-10-CM | POA: Diagnosis not present

## 2012-02-29 DIAGNOSIS — I10 Essential (primary) hypertension: Secondary | ICD-10-CM | POA: Diagnosis present

## 2012-02-29 DIAGNOSIS — Z7901 Long term (current) use of anticoagulants: Secondary | ICD-10-CM

## 2012-02-29 HISTORY — DX: Ventricular tachycardia: I47.2

## 2012-02-29 HISTORY — DX: Atrial septal defect: Q21.1

## 2012-02-29 HISTORY — DX: Malformation of coronary vessels: Q24.5

## 2012-02-29 HISTORY — DX: Patent foramen ovale: Q21.12

## 2012-02-29 HISTORY — DX: Other ventricular tachycardia: I47.29

## 2012-02-29 HISTORY — DX: Long QT syndrome: I45.81

## 2012-02-29 HISTORY — DX: Syncope and collapse: R55

## 2012-02-29 HISTORY — DX: Major depressive disorder, single episode, unspecified: F32.9

## 2012-02-29 HISTORY — DX: Essential (primary) hypertension: I10

## 2012-02-29 HISTORY — DX: Depression, unspecified: F32.A

## 2012-02-29 HISTORY — DX: Unspecified osteoarthritis, unspecified site: M19.90

## 2012-02-29 HISTORY — DX: Unspecified dementia, unspecified severity, without behavioral disturbance, psychotic disturbance, mood disturbance, and anxiety: F03.90

## 2012-02-29 HISTORY — DX: Malignant neoplasm of unspecified site of unspecified female breast: C50.919

## 2012-02-29 LAB — COMPREHENSIVE METABOLIC PANEL
ALT: 20 U/L (ref 0–35)
AST: 25 U/L (ref 0–37)
Albumin: 3.6 g/dL (ref 3.5–5.2)
Alkaline Phosphatase: 79 U/L (ref 39–117)
BUN: 24 mg/dL — ABNORMAL HIGH (ref 6–23)
CO2: 25 mEq/L (ref 19–32)
Chloride: 102 mEq/L (ref 96–112)
Creatinine, Ser: 0.94 mg/dL (ref 0.50–1.10)
GFR calc non Af Amer: 63 mL/min — ABNORMAL LOW (ref >90)
Sodium: 140 mEq/L (ref 135–145)
Total Bilirubin: 0.2 mg/dL — ABNORMAL LOW (ref 0.3–1.2)

## 2012-02-29 LAB — PROTIME-INR
INR: 1.82 — ABNORMAL HIGH (ref 0.00–1.49)
Prothrombin Time: 20.4 seconds — ABNORMAL HIGH (ref 11.6–15.2)

## 2012-02-29 LAB — CBC WITH DIFFERENTIAL/PLATELET
Basophils Absolute: 0 10*3/uL (ref 0.0–0.1)
Basophils Relative: 0 % (ref 0–1)
HCT: 37.4 % (ref 36.0–46.0)
Lymphocytes Relative: 43 % (ref 12–46)
MCHC: 34.5 g/dL (ref 30.0–36.0)
Monocytes Absolute: 0.6 10*3/uL (ref 0.1–1.0)
Neutro Abs: 3.6 10*3/uL (ref 1.7–7.7)
Neutrophils Relative %: 47 % (ref 43–77)
Platelets: 276 10*3/uL (ref 150–400)
RDW: 13.5 % (ref 11.5–15.5)
WBC: 7.6 10*3/uL (ref 4.0–10.5)

## 2012-02-29 LAB — TSH: TSH: 2.212 u[IU]/mL (ref 0.350–4.500)

## 2012-02-29 LAB — MRSA PCR SCREENING: MRSA by PCR: NEGATIVE

## 2012-02-29 LAB — APTT: aPTT: 40 seconds — ABNORMAL HIGH (ref 24–37)

## 2012-02-29 MED ORDER — POTASSIUM CHLORIDE CRYS ER 20 MEQ PO TBCR
40.0000 meq | EXTENDED_RELEASE_TABLET | Freq: Two times a day (BID) | ORAL | Status: AC
  Administered 2012-02-29 – 2012-03-01 (×2): 40 meq via ORAL
  Filled 2012-02-29 (×2): qty 2

## 2012-02-29 MED ORDER — ENOXAPARIN SODIUM 40 MG/0.4ML ~~LOC~~ SOLN
40.0000 mg | SUBCUTANEOUS | Status: DC
  Administered 2012-02-29 – 2012-03-04 (×5): 40 mg via SUBCUTANEOUS
  Filled 2012-02-29 (×6): qty 0.4

## 2012-02-29 MED ORDER — NITROGLYCERIN 0.4 MG SL SUBL: 0.4000 mg | SUBLINGUAL_TABLET | SUBLINGUAL | Status: DC | PRN

## 2012-02-29 MED ORDER — ONDANSETRON HCL 4 MG/2ML IJ SOLN: 4.0000 mg | Freq: Four times a day (QID) | INTRAMUSCULAR | Status: DC | PRN

## 2012-02-29 MED ORDER — ALPRAZOLAM 0.5 MG PO TABS: 1.0000 mg | ORAL_TABLET | Freq: Every evening | ORAL | Status: DC | PRN

## 2012-02-29 MED ORDER — ALPRAZOLAM 0.25 MG PO TABS
0.5000 mg | ORAL_TABLET | Freq: Three times a day (TID) | ORAL | Status: DC
  Administered 2012-02-29 – 2012-03-06 (×18): 0.5 mg via ORAL
  Filled 2012-02-29 (×7): qty 1
  Filled 2012-02-29: qty 2
  Filled 2012-02-29 (×6): qty 1
  Filled 2012-02-29: qty 2
  Filled 2012-02-29 (×3): qty 1

## 2012-02-29 MED ORDER — ACETAMINOPHEN 325 MG PO TABS
650.0000 mg | ORAL_TABLET | ORAL | Status: DC | PRN
  Administered 2012-03-02 – 2012-03-06 (×3): 650 mg via ORAL
  Filled 2012-02-29 (×3): qty 2

## 2012-02-29 MED ORDER — MAGNESIUM SULFATE 40 MG/ML IJ SOLN
2.0000 g | Freq: Once | INTRAMUSCULAR | Status: AC
  Administered 2012-02-29: 2 g via INTRAVENOUS
  Filled 2012-02-29: qty 50

## 2012-02-29 NOTE — H&P (Addendum)
HPI:  General:  The patient presents today for evaluation of arrhythmia noted on Lif[employer] monitor. She saw me on 1/22 for problems with dizziness, presyncope, syncope and palpitations. Because of her history of PAF I placed a Lifewatch monitor. Last PM she was noted to have wide complex tachcyardia at a rate of 258bpm which was c/w torsades. She had dizziness yesterday at the time of the arrhythmia. She denies any chest pain or SOB..        ROS:  See HPI, A twelve system review was perfomed at today's visit. For pertinent positives and negatives see HPI.       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 small ASD with no pulmonary hypertension.        Surgical History: left mastectomy 1986.        Family History: Father: deceased prostate CA Mother: deceased 54 yrs heart problem and rheumatoid arthritis Brother 1: alive Brother2: alive Sister 1: alive Sister 2: alive        Social History:  General:  History of smoking cigarettes: Never smoked.  no Smoking.  no Tobacco Exposure.  Alcohol: yes, social, 1-2 per week.  Caffeine: yes, 2 servings daily, coffee.  no Recreational drug use.  Marital Status: single.  Children: 4 children.        Medications: Sertraline HCl 100 mg Tablet 1 tablet Once a day, Zolpidem Tartrate 10 MG Tablet 1 tablet at bedtime Once a day, Tramadol 50 mg tablet one tab every 4-6 hours prn pain, Celebrex 200 MG Capsule 1 capsule as needed, Klor-Con M20 20 MEQ Tablet Extended Release 1 tab qd, PT NEEDS F/U APPT FOR MORE REFILLS. , Alprazolam 1 mg 1/2 tablet once at bedtime, Metoprolol Succinate 50 MG Tablet Extended Release 24 Hour 1 & 1/2 tablets Once a day, Triamterene-HCTZ 37.5-25 MG Tablet 1 tablet in the morning Once a day, Warfarin Sodium 5 MG Tablet 1 tablet daily  except 1 and 1/2 tablets on Friday Once a day, Medication List reviewed and reconciled with the patient       Allergies: Codeine (for allergy).       Objective:     Vitals: Wt 200.6, Wt change .8 lb, Ht 67, BMI 31.41, Pulse sitting 51, BP sitting 124/67.       Examination:  Cardiology, General:  GENERAL APPEARANCE: pleasant, NAD.  HEENT: unremarkable.  CAROTID UPSTROKE: normal, no bruit.  JVD: flat.  HEART SOUNDS: regular, normal S1, S2, no S3 or S4.  MURMUR: absent.  LUNGS: no rales or wheezes.  ABDOMEN: soft, non tender, positive bowel sounds, no masses felt.  EXTREMITIES: no leg edema.  PERIPHERAL PULSES: 2 plus bilateral.        Assessment:     Assessment:  1. Ventricular tachycardia, polymorphic - 427.1 (Primary), She appears to be having polymorphic VT which is causing presyncope and syncopal episodes. She had a cath in 2006 which showed normal coronary arteries. She has a history of mild LV dysfunction with EF 40% by echo in 2012 and also a history of PFO.  2. Benign hypertensive heart disease without heart failure - 402.10  3. Atrial fibrillation - 427.31  4. Ostium secundum type atrial septal defect - 745.5  5. Anticoagulant long-term use - V58.61    Plan:     1. Ventricular tachycardia, polymorphic  I have recommended  that she be admitted to CCU for further telemetry monitoring. I will check electrolytes since she is on a diuretic. I will go ahead and give her Magnesium sulfate 2gm IV. Stop Metoprolol given torsades. At this time her heart rate is normal so no indication for Isuprel at this time. I have spoken with Dr. Graciela Husbands who will see her in consultation. Stop Sertraline.  2.  After discussion with Dr. Graciela Husbands - will stop Coumadin for now given that patient's most likely arrhythmia in the past was NSVT and not PAF with aberration.            Provider: Armanda Magic, MD

## 2012-02-29 NOTE — Progress Notes (Signed)
ANTICOAGULATION CONSULT NOTE - Initial Consult  Pharmacy Consult for LMWH Indication: VTE prophylaxis  Allergies  Allergen Reactions  . Codeine     Patient Measurements: Height: 5\' 7"  (170.2 cm) Weight: 198 lb 4.8 oz (89.948 kg) IBW/kg (Calculated) : 61.6    Vital Signs: Temp: 97.8 F (36.6 C) (01/24 1600) Temp src: Oral (01/24 1600) BP: 91/42 mmHg (01/24 1700) Pulse Rate: 58  (01/24 1700)  Labs:  Basename 02/29/12 1600 02/29/12 1558  HGB -- 12.9  HCT -- 37.4  PLT -- 276  APTT -- 40*  LABPROT -- 20.4*  INR -- 1.82*  HEPARINUNFRC -- --  CREATININE -- 0.94  CKTOTAL -- --  CKMB -- --  TROPONINI <0.30 --    Estimated Creatinine Clearance: 70.5 ml/min (by C-G formula based on Cr of 0.94).   Medical History: Past Medical History  Diagnosis Date  . Hypertension   . Depression   . Anxiety   . Headache     MIGRAINES  . Cancer 1986    BREAST CANCER  . Arthritis   . Dementia   . Dysrhythmia     atrial fibrillation  . PFO (patent foramen ovale)   . 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 ALPRAZolam (XANAX) 1 MG tablet Take 1 mg by mouth at bedtime as needed. Take 1/2 tablet at bedtime      . celecoxib (CELEBREX) 200 MG capsule Take 200 mg by mouth 2 (two) times daily as needed.      . metoprolol succinate (TOPROL-XL) 50 MG 24 hr tablet Take 50 mg by mouth daily. Take 1 and 1/2 tablet daily      . potassium chloride SA (K-DUR,KLOR-CON) 20 MEQ tablet Take 20 mEq by mouth daily.      . sertraline (ZOLOFT) 100 MG tablet Take 100 mg by mouth daily.      . traMADol (ULTRAM) 50 MG tablet Take 50 mg by mouth every 6 (six) hours as needed.      . triamterene-hydrochlorothiazide (MAXZIDE-25) 37.5-25 MG per tablet Take 1 tablet by mouth daily.      Marland Kitchen warfarin (COUMADIN) 5 MG tablet Take 5 mg by mouth as directed.      . zolpidem (AMBIEN) 10 MG tablet  Take 10 mg by mouth at bedtime as needed.        Assessment: Michelle Castro is a 62 yo F on coumadin PTA for Afib. This has been stopped as there is no evidence of afib.  She will be started on LMWH for VTE prophylaxis.  Her INR is 1.82 and her CBC is WNL.  Her wt is ~ 90 kg and her creat cl is ~ 70 ml/min.  Goal of Therapy: prevent VTE Monitor platelets by anticoagulation protocol: Yes   Plan:  LMWH 40 mg sq q24 Herby Abraham, Pharm.D. [phone] 02/29/2012 5:56 PM

## 2012-02-29 NOTE — Consult Note (Addendum)
ELECTROPHYSIOLOGY CONSULT NOTE  Patient ID: Michelle Castro MRN: [id number], DOB/AGE: [birth year]   Admit date: 02/29/2012 Date of Consult: 02/29/2012  Primary Physician: None currently Primary Cardiologist: Armanda Magic, MD Reason for Consultation: Syncope, VT  History of Present Illness Michelle Castro is a pleasant 62 year old woman with PAF, normal coronaries by cath May 2012, normal LV function, HTN and depression who has experienced recurrent dizziness, near syncope and one episode of syncope last week which prompted Dr. Mayford Knife to order a monitor. This was placed yesterday. Last night she was found to have a wide complex tachycardia which was accompanied by symptom of dizziness. Dr. Mayford Knife contacted her at home and instructed her to present for admission to [facility]. She is currently in SR.   Michelle Castro reports intermittent dizziness and palpitations "for years" which has been documented with her atrial fibrillation. However, she has never experienced syncope before until last Sunday. She was out running errands with her daughter when she experienced an abrupt LOC. She had no warning or prodrome. She did not injure herself as her daughter apparently caught her. She reports she was unresponsive for only seconds. She then had the monitor placed yesterday. She reports an episode of dizziness and racing palpitations last night corresponding to the time of her WCT documented on [employer]Watch. She denies CP or SOB. She denies any changes to her health or medications recently. She denies recent illness, fever or chills. She has felt like her usual self otherwise. Her 12-lead ECG from this admission is currently pending. She has QTc 470 msec by ECG from October 2013. Her admission labs are also pending.  Past Medical History Past Medical History  Diagnosis Date  . Hypertension   . Depression   . Anxiety   . Headache     MIGRAINES  . Cancer 1986    BREAST CANCER  . Arthritis   . Dementia   .  Dysrhythmia     atrial fibrillation  . PFO (patent foramen ovale)   . 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 Abdominal hysterectomy   . Arthoplasty   . Total knee arthroplasty     bilateral  . Breast surgery     mastectomy     Allergies/Intolerances Allergies  Allergen Reactions  . Codeine     Inpatient Medications    . magnesium sulfate 1 - 4 g bolus IVPB  2 g Intravenous Once     Family History Negative for CAD or sudden cardiac death   Social History Social History  . Marital Status: Married   Social History Main Topics  . Smoking status: Never Smoker   . Smokeless tobacco: Never Used  . Alcohol Use: No  . Drug Use: No   Review of Systems General: No chills, fever, night sweats or weight changes  Cardiovascular: No chest pain, dyspnea on exertion, edema, orthopnea, palpitations, paroxysmal nocturnal dyspnea Dermatological: No rash, lesions or masses Respiratory: No cough, dyspnea Urologic: No hematuria, dysuria Abdominal: No nausea, vomiting, diarrhea, bright red blood per rectum, melena, or hematemesis Neurologic: No visual changes, weakness, changes in mental status All other systems reviewed and are otherwise negative except as noted above.  Physical Exam Blood pressure 139/78, pulse 62, temperature 98.1 F (36.7 C), temperature source Oral, resp. rate 18, height 5\' 7"  (1.702 m), weight 198 lb 4.8 oz (89.948 kg), SpO2 96.00%.  General: Well developed, well appearing 62 year old female in no acute distress. HEENT: Normocephalic, atraumatic. EOMs intact. Sclera nonicteric. Oropharynx clear.  Neck: Supple without bruits. No JVD. Lungs: Respirations regular and unlabored, CTA bilaterally. No wheezes, rales or rhonchi. Heart: RRR. S1, S2 present. No murmurs, rub, S3 or S4. Abdomen: Soft, non-tender, non-distended. BS present x 4 quadrants. No  hepatosplenomegaly.  Extremities: No clubbing, cyanosis or edema. DP/PT/Radials 2+ and equal bilaterally. Psych: Normal affect. Neuro: Alert and oriented X 3. Moves all extremities spontaneously. Musculoskeletal: No kyphosis. Skin: Intact. Warm and dry. No rashes or petechiae in exposed areas.   Labs Pending  Radiology/Studies No results found.  Echocardiogram Pending  12-lead ECG this admission pending 12-lead ECG from October 2012 - sinus rhythm, RBBB, LAFB QTc 470 msec  LifeWatch strips reviewed by Dr. Graciela Husbands Brief episode of polymorphic VT last night ~10:19 PM   Assessment and Plan 1. Polymorphic VT 2. Normal coronaries by cath May 2012 3. Normal LV function Michelle Castro has documented PMVT and a reported history of intermittent dizziness x years. She has history of normal coronaries by cath May 2012 and normal LV function. Given the chronicity of her symptoms, abnormal ECG, now with documented PMVT we suspect long QT syndrome. Dr. Graciela Husbands to see and make further recommendations.  Signed, Rick Duff, PA-C 02/29/2012, 4:00 PM  Pt with polymorphic VT and hx of syncope and presyncope which dates back about 9-10  years which is coincidental with the use of sertraline, potential arrhythmogenic in the context of LQT.  Her episodes are long short initiated and QT borderline.   There is no family history to suggest LQTS. The relatively recent onset make LQTS also less likely but not impossible.  This makes drug induced torsades most likely; prob in context of forme fruste  Her K is a little low, and will empirically give MAg.  Have spoken with PCP and will stop sertraline and follow rhythm  The washout is supposed to be 4-5 days and would use PVC burden as measure of electrical stability  No evidence of Af so will stop coumadin  Continue betablocker

## 2012-02-29 NOTE — Telephone Encounter (Signed)
Called by Lif[employer] due to 6 sec of wide complex tachycardia at 250 bpm. Occurred around 10 pm and apparently lifewatch could not get a hold of the patient. Notified me. Called patient, doing well. At around 10 pm, had palpitations, similar to symptoms in the past (noted prior WCT that resulted in cath). Dr. Mayford Knife continuing workup. No red flag symptoms. Follow up already scheduled. Lifewatch to send strips to Dr. Norris Cross office.

## 2012-02-29 NOTE — Progress Notes (Signed)
   ELECTROPHYSIOLOGY ROUNDING NOTE    Patient Name: Michelle Castro Date of Encounter: 02-29-2012  Asked by Dr Mayford Knife and Dr Graciela Husbands to contact patient's primary care physician for recommendations on alternative SSRI's.  Pt with polymorphic VT and concern that current SSRI therapy is prolonging QT interval.  Dr Waynard Edwards on call for Dr Wylene Simmer.  Dr Perini's answering service notified of need for consult.   Signed, Marena Chancy, BSN

## 2012-02-29 NOTE — Progress Notes (Signed)
  Echocardiogram 2D Echocardiogram has been performed.  Michelle Castro FRANCES 02/29/2012, 5:20 PM

## 2012-03-01 LAB — CBC
Platelets: 276 10*3/uL (ref 150–400)
RBC: 4.52 MIL/uL (ref 3.87–5.11)
RDW: 13.6 % (ref 11.5–15.5)
WBC: 7 10*3/uL (ref 4.0–10.5)

## 2012-03-01 LAB — BASIC METABOLIC PANEL
Calcium: 9.1 mg/dL (ref 8.4–10.5)
Chloride: 106 mEq/L (ref 96–112)
Creatinine, Ser: 1.05 mg/dL (ref 0.50–1.10)
GFR calc Af Amer: 64 mL/min — ABNORMAL LOW (ref >90)
Sodium: 144 mEq/L (ref 135–145)

## 2012-03-01 LAB — PROTIME-INR: INR: 1.92 — ABNORMAL HIGH (ref 0.00–1.49)

## 2012-03-01 MED ORDER — LORATADINE 10 MG PO TABS
10.0000 mg | ORAL_TABLET | Freq: Every day | ORAL | Status: DC | PRN
  Administered 2012-03-01 – 2012-03-02 (×2): 10 mg via ORAL
  Filled 2012-03-01 (×3): qty 1

## 2012-03-01 MED ORDER — METOPROLOL SUCCINATE ER 50 MG PO TB24
75.0000 mg | ORAL_TABLET | Freq: Every day | ORAL | Status: DC
  Administered 2012-03-01 – 2012-03-02 (×2): 75 mg via ORAL
  Filled 2012-03-01 (×2): qty 1

## 2012-03-01 NOTE — Progress Notes (Addendum)
Subjective:  Doing well. No CP, no dizziness, no SOB.   Objective:  Vital Signs in the last 24 hours: Temp:  [97.3 F (36.3 C)-98.1 F (36.7 C)] 97.3 F (36.3 C) (01/25 0800) Pulse Rate:  [56-70] 70  (01/25 0800) Resp:  [18] 18  (01/24 1449) BP: (91-139)/(42-78) 119/64 mmHg (01/25 0800) SpO2:  [94 %-98 %] 98 % (01/25 0800) Weight:  [89.948 kg (198 lb 4.8 oz)] 89.948 kg (198 lb 4.8 oz) (01/24 1449)  Intake/Output from previous day: 01/24 0701 - 01/25 0700 In: 770 [P.O.:720; IV Piggyback:50] Out: -    Physical Exam: General: Well developed, well nourished, in no acute distress. Head:  Normocephalic and atraumatic. Lungs: Clear to auscultation and percussion. Heart: Normal S1 and S2.  No murmur, rubs or gallops.  Abdomen: soft, non-tender, positive bowel sounds. Extremities: No clubbing or cyanosis. No edema. Neurologic: Alert and oriented x 3.    Lab Results:  Basename 03/01/12 0505 02/29/12 1558  WBC 7.0 7.6  HGB 13.2 12.9  PLT 276 276    Basename 03/01/12 0505 02/29/12 1558  NA 144 140  K 4.1 3.6  CL 106 102  CO2 29 25  GLUCOSE 95 107*  BUN 23 24*  CREATININE 1.05 0.94    Basename 02/29/12 1600  TROPONINI <0.30   Hepatic Function Panel  Basename 02/29/12 1558  PROT 7.1  ALBUMIN 3.6  AST 25  ALT 20  ALKPHOS 79  BILITOT 0.2*  BILIDIR --  IBILI --     Telemetry: Occasional PVC's, no VT (lifewatch monitor with polymorphic VT) Personally viewed.   EKG:  QTc on 10/13.   Cardiac Studies:  Normal EF  Assessment/Plan:  Principal Problem:  *Polymorphic ventricular tachycardia Active Problems:  PFO (patent foramen ovale)  Essential hypertension  Atrial fibrillation  Chronic anticoagulation   -No arrhythmias overnight.  -Off coumadin - No afib.  -Off SSRI -K replete 4.1. Getting Kdur (2 doses). Will check BMET in am.  -Mag 2,2  -Reviewed Dr. Chauncey Fischer note. Will monitor over weekend for any further VT.     Donato Schultz 03/01/2012, 9:19 AM    Reviewed Dr. Odessa Fleming note once again. I will resume metoprolol.

## 2012-03-02 LAB — BASIC METABOLIC PANEL
CO2: 29 mEq/L (ref 19–32)
Calcium: 9.7 mg/dL (ref 8.4–10.5)
Creatinine, Ser: 0.95 mg/dL (ref 0.50–1.10)

## 2012-03-02 MED ORDER — TRAMADOL HCL 50 MG PO TABS
50.0000 mg | ORAL_TABLET | Freq: Four times a day (QID) | ORAL | Status: DC | PRN
  Administered 2012-03-02 – 2012-03-06 (×3): 50 mg via ORAL
  Filled 2012-03-02 (×3): qty 1

## 2012-03-02 MED ORDER — METOPROLOL SUCCINATE ER 50 MG PO TB24
50.0000 mg | ORAL_TABLET | Freq: Every day | ORAL | Status: DC
  Administered 2012-03-02: 50 mg via ORAL
  Filled 2012-03-02 (×3): qty 1

## 2012-03-02 NOTE — Progress Notes (Signed)
Transferred  from 2913 ambulatory. Denied any discomfort. Connected zoll at bedside.

## 2012-03-02 NOTE — Progress Notes (Signed)
Subjective:  Feels well. No complaints. No syncope, no dizziness. Says she's had the best sleep here that she has had in a while.   Objective:  Vital Signs in the last 24 hours: Temp:  [96.7 F (35.9 C)-97.6 F (36.4 C)] 97.6 F (36.4 C) (01/26 0521) Pulse Rate:  [54-79] 71  (01/26 0700) BP: (97-121)/(40-77) 112/64 mmHg (01/26 0521) SpO2:  [94 %-99 %] 97 % (01/26 0700)  Intake/Output from previous day: 01/25 0701 - 01/26 0700 In: 1680 [P.O.:1680] Out: 1450 [Urine:1450]   Physical Exam: General: Well developed, well nourished, in no acute distress. Head:  Normocephalic and atraumatic. Lungs: Clear to auscultation and percussion. Heart: Normal S1 and S2. Occasional ectopy.  No murmur, rubs or gallops.  Abdomen: soft, non-tender, positive bowel sounds. Extremities: No clubbing or cyanosis. No edema. Neurologic: Alert and oriented x 3.    Lab Results:  Basename 03/01/12 0505 02/29/12 1558  WBC 7.0 7.6  HGB 13.2 12.9  PLT 276 276    Basename 03/02/12 0500 03/01/12 0505  NA 138 144  K 4.4 4.1  CL 103 106  CO2 29 29  GLUCOSE 87 95  BUN 24* 23  CREATININE 0.95 1.05    Basename 02/29/12 1600  TROPONINI <0.30   Hepatic Function Panel  Basename 02/29/12 1558  PROT 7.1  ALBUMIN 3.6  AST 25  ALT 20  ALKPHOS 79  BILITOT 0.2*  BILIDIR --  IBILI --    Telemetry: Occasional PVC's multifocal. No NSVT.  Personally viewed.   EKG:  QTc on 10/13. 03/01/12   Cardiac Studies:  ECHO - Left ventricle: The cavity size was normal. Systolic function was mildly to moderately reduced. The estimated ejection fraction was in the range of 40% to 45%. There is hypokinesis of the basalanteroseptal myocardium. Doppler parameters are consistent with abnormal left ventricular relaxation (grade 1 diastolic dysfunction). - Mitral valve: Mild regurgitation. - Left atrium: The atrium was mildly dilated.    Assessment/Plan:  Principal Problem:  *Polymorphic  ventricular tachycardia Active Problems:  PFO (patent foramen ovale)  Essential hypertension  Atrial fibrillation  Chronic anticoagulation   62 year old with episode of polymorphic VT caught on Lifewatch monitor, symptomatic with presyncope and syncopal episodes in the past. EF 40-45%  1. VT - no further occurrence here in the hospital CCU. Will transfer to stepdown. Monitor. I have continued metoprolol 75mg  ER as she was on at home at suggestion of Dr. Graciela Husbands.  Syncope and presyncope has been present over past 10 years. Sertraline has been stopped on this admit. Episodes are long short initiated. No family history to suggest LQTS.  K and Mag replete.   2. Mild LV systolic dysfunction - EF 40-45%, this has been present and unchanged. Cath 2012 normal CORS. Cath report suggests normal EF but both echos have demonstrated decreased function. Beta blocker. Suggest adding ACE-I if BP able to tolerate.   3. Coumadin has been stopped. Thought is that she does not actually have PAF.   Will monitor today in stepdown. Dr. Mayford Knife.  Michelle Castro 03/02/2012, 8:07 AM

## 2012-03-03 ENCOUNTER — Encounter (HOSPITAL_COMMUNITY): Payer: Self-pay | Admitting: Internal Medicine

## 2012-03-03 DIAGNOSIS — I472 Ventricular tachycardia: Principal | ICD-10-CM

## 2012-03-03 MED ORDER — PROPRANOLOL HCL ER 160 MG PO CP24
160.0000 mg | ORAL_CAPSULE | Freq: Every day | ORAL | Status: DC
  Administered 2012-03-03 – 2012-03-06 (×4): 160 mg via ORAL
  Filled 2012-03-03 (×4): qty 1

## 2012-03-03 NOTE — Progress Notes (Addendum)
SUBJECTIVE:  Still having short runs of VT as well as PVC's on T wave  OBJECTIVE:   Vitals:   Filed Vitals:   03/02/12 2000 03/02/12 2333 03/03/12 0404 03/03/12 0715  BP: 115/70 117/57 113/60 100/54  Pulse:    66  Temp: 97.5 F (36.4 C) 97.8 F (36.6 C) 98.5 F (36.9 C) 98.3 F (36.8 C)  TempSrc:  Oral Oral Oral  Resp: 18 18 20 18   Height:      Weight:      SpO2: 98% 98% 99% 97%   I&O's:   Intake/Output Summary (Last 24 hours) at 03/03/12 0839 Last data filed at 03/03/12 0000  Gross per 24 hour  Intake    720 ml  Output      0 ml  Net    720 ml   TELEMETRY: Reviewed telemetry pt in NSR with short bursts of polymorphic VT:     PHYSICAL EXAM General: Well developed, well nourished, in no acute distress Head: Eyes PERRLA, No xanthomas.   Normal cephalic and atramatic  Lungs:   Clear bilaterally to auscultation and percussion. Heart:   HRRR S1 S2 Pulses are 2+ & equal. Abdomen: Bowel sounds are positive, abdomen soft and non-tender without masses  Extremities:   No clubbing, cyanosis or edema.  DP +1 Neuro: Alert and oriented X 3. Psych:  Good affect, responds appropriately   LABS: Basic Metabolic Panel:  Basename 03/02/12 0500 03/01/12 0505 02/29/12 1558  NA 138 144 --  K 4.4 4.1 --  CL 103 106 --  CO2 29 29 --  GLUCOSE 87 95 --  BUN 24* 23 --  CREATININE 0.95 1.05 --  CALCIUM 9.7 9.1 --  MG -- -- 2.2  PHOS -- -- --   Liver Function Tests:  Basename 02/29/12 1558  AST 25  ALT 20  ALKPHOS 79  BILITOT 0.2*  PROT 7.1  ALBUMIN 3.6   No results found for this basename: LIPASE:2,AMYLASE:2 in the last 72 hours CBC:  Basename 03/01/12 0505 02/29/12 1558  WBC 7.0 7.6  NEUTROABS -- 3.6  HGB 13.2 12.9  HCT 39.7 37.4  MCV 87.8 87.0  PLT 276 276   Cardiac Enzymes:  Basename 02/29/12 1600  CKTOTAL --  CKMB --  CKMBINDEX --  TROPONINI <0.30   Thyroid Function Tests:  Basename 02/29/12 1558  TSH 2.212  T4TOTAL --  T3FREE --  THYROIDAB --    Anemia Panel: No results found for this basename: VITAMINB12,FOLATE,FERRITIN,TIBC,IRON,RETICCTPCT in the last 72 hours Coag Panel:   Lab Results  Component Value Date   INR 1.92* 03/01/2012   INR 1.82* 02/29/2012   INR 1.89* 11/20/2010    RADIOLOGY: No results found.  Assessment:  1. Ventricular tachycardia, polymorphic - 427.1 (Primary), She appears to be having polymorphic VT which is causing presyncope and syncopal episodes. She had a cath in 2006 which showed normal coronary arteries. She has a history of mild LV dysfunction with EF 40% by echo in 2012 and also a history of PFO. Sertraline is not discontinued but QTc still appears prolonged.   2. Benign hypertensive heart disease without heart failure - 402.10  - controlled 3. Ostium secundum type atrial septal defect - 745.5     Plan: 1.  Discussed with Dr. Graciela Husbands - will stop Metoprolol and start Inderal LA 160mg  daily which has been shown to be a better agent to shorten QT 2.  Continue to monitor in unit for 48 more hours to allow Sertraline to  completely wear off. 3.  If patient continues to have polymorphic VT will need AICD 4.  Dr. Graciela Husbands talked with patient about genetic testing for lont QT syndrome 5.  Episodes of wide complex tachycardia in the past are probably related to above arrhythmia and not afib with abberration.  Dr. Graciela Husbands recommended stopping coumadin.   Quintella Reichert, MD  03/03/2012  8:39 AM

## 2012-03-03 NOTE — Progress Notes (Signed)
Had short runs of v-tach, asymptomatic. Continue to monitor. 

## 2012-03-03 NOTE — Progress Notes (Signed)
Patient Name: Michelle Castro      SUBJECTIVE: without symptoms   Past Medical History  Diagnosis Date  . Hypertension   . Depression/ anxiety   . Syncope   . Headache     MIGRAINES  . Cancer 1986    BREAST CANCER  . Arthritis   . Dementia   . PFO (patent foramen ovale)   . 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 Ventricular tachycardia, polymorphic     long-short    PHYSICAL EXAM Filed Vitals:   03/02/12 2000 03/02/12 2333 03/03/12 0404 03/03/12 0715  BP: 115/70 117/57 113/60 100/54  Pulse:    66  Temp: 97.5 F (36.4 C) 97.8 F (36.6 C) 98.5 F (36.9 C) 98.3 F (36.8 C)  TempSrc:  Oral Oral Oral  Resp: 18 18 20 18   Height:      Weight:      SpO2: 98% 98% 99% 97%    Well developed and nourished in no acute distress HENT normal Neck supple with JVP-flat Clear Regular rate and rhythm, no murmurs or gallops Abd-soft with active BS No Clubbing cyanosis edema Skin-warm and dry A & Oriented  Grossly normal sensory and motor function   TELEMETRY: Reviewed telemetry pt in nsr with ongoing arrhtymthmia as above    Intake/Output Summary (Last 24 hours) at 03/03/12 0827 Last data filed at 03/03/12 0000  Gross per 24 hour  Intake    720 ml  Output      0 ml  Net    720 ml    LABS: Basic Metabolic Panel:  Lab 03/02/12 9147 03/01/12 0505 02/29/12 1558  NA 138 144 140  K 4.4 4.1 3.6  CL 103 106 102  CO2 29 29 25   GLUCOSE 87 95 107*  BUN 24* 23 24*  CREATININE 0.95 1.05 0.94  CALCIUM 9.7 9.1 --  MG -- -- 2.2  PHOS -- -- --   Cardiac Enzymes:  Basename 02/29/12 1600  CKTOTAL --  CKMB --  CKMBINDEX --  TROPONINI <0.30   CBC:  Lab 03/01/12 0505 02/29/12 1558  WBC 7.0 7.6  NEUTROABS -- 3.6  HGB 13.2 12.9  HCT 39.7 37.4  MCV 87.8 87.0  PLT 276 276   PROTIME:  Basename 03/01/12 0505 02/29/12 1558  LABPROT 21.2* 20.4*  INR 1.92* 1.82*   Liver Function Tests:  Basename 02/29/12 1558  AST 25  ALT  20  ALKPHOS 79  BILITOT 0.2*  PROT 7.1  ALBUMIN 3.6   Thyroid Function Tests:  Basename 02/29/12 1558  TSH 2.212  T4TOTAL --  T3FREE --  THYROIDAB --   Anemia Panel: No results found for this basename: VITAMINB12,FOLATE,FERRITIN,TIBC,IRON,RETICCTPCT in the last 72 hours  ECG 1/26 16/11/48 with QTc 51 ( my read)  ASSESSMENT AND PLAN:  Patient Active Hospital Problem List: Polymorphic ventricular tachycardia (02/29/2012)    Still w episodes of long short initiated VT-PM  Albeit only 3-4 beats. freq ectopy with the same triggering beat morphology and VT-NS monomorphic--prob RVOT The t 1/2 of sertraline is 26 hrs so it will be wed before we can persume the drug is all gone.  If she continues to have VT-PM i would suggest we declare her LQTS, undertake genetic testing and consider ICD implantation   The least effective betablocker in retrospective studies is metoprolol,  Propranolol and nadolol seem to be most effective so will change to the former Persistent symptoms despite bb  is an indication for an ICD   Signed, Sherryl Manges MD  03/03/2012

## 2012-03-04 DIAGNOSIS — F329 Major depressive disorder, single episode, unspecified: Secondary | ICD-10-CM | POA: Diagnosis present

## 2012-03-04 LAB — BASIC METABOLIC PANEL
Calcium: 9.4 mg/dL (ref 8.4–10.5)
Creatinine, Ser: 1.03 mg/dL (ref 0.50–1.10)
GFR calc non Af Amer: 57 mL/min — ABNORMAL LOW (ref >90)
Glucose, Bld: 94 mg/dL (ref 70–99)
Sodium: 142 mEq/L (ref 135–145)
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
## 2012-03-04 NOTE — Progress Notes (Signed)
SUBJECTIVE:  No complaints  OBJECTIVE:   Vitals:   Filed Vitals:   03/03/12 2000 03/03/12 2359 03/04/12 0401 03/04/12 0725  BP: 108/66 106/50 112/47 118/63  Pulse: 71 71    Temp: 97.6 F (36.4 C) 97.4 F (36.3 C) 98.1 F (36.7 C) 97.8 F (36.6 C)  TempSrc: Oral Oral Oral Oral  Resp:   16 14  Height:      Weight:      SpO2: 95% 94% 95% 94%   I&O's:   Intake/Output Summary (Last 24 hours) at 03/04/12 1610 Last data filed at 03/03/12 2000  Gross per 24 hour  Intake    710 ml  Output      0 ml  Net    710 ml   TELEMETRY: Reviewed telemetry pt in NSR with 7-8 beat run on NSVT last PM     PHYSICAL EXAM General: Well developed, well nourished, in no acute distress Head: Eyes PERRLA, No xanthomas.   Normal cephalic and atramatic  Lungs:   Clear bilaterally to auscultation and percussion. Heart:   HRRR S1 S2 Pulses are 2+ & equal. Abdomen: Bowel sounds are positive, abdomen soft and non-tender without masses Extremities:   No clubbing, cyanosis or edema.  DP +1 Neuro: Alert and oriented X 3. Psych:  Good affect, responds appropriately   LABS: Basic Metabolic Panel:  Basename 03/04/12 0505 03/02/12 0500  NA 142 138  K 4.8 4.4  CL 106 103  CO2 28 29  GLUCOSE 94 87  BUN 21 24*  CREATININE 1.03 0.95  CALCIUM 9.4 9.7  MG -- --  PHOS -- --   Coag Panel:   Lab Results  Component Value Date   INR 1.92* 03/01/2012   INR 1.82* 02/29/2012   INR 1.89* 11/20/2010    RADIOLOGY: No results found. Assessment:  1. Ventricular tachycardia, polymorphic - 427.1 (Primary), She appears to be having polymorphic VT which is causing presyncope and syncopal episodes. She had a cath in 2006 which showed normal coronary arteries. She has a history of mild LV dysfunction with EF 40% by echo in 2012 and also a history of PFO. Sertraline is now discontinued but QTc still appears prolonged. She had another 7 beat run of NSVT last PM around 5-5:30PM 2. Benign hypertensive heart disease  without heart failure - 402.10 - controlled 3. Ostium secundum type atrial septal defect - 745.5  4.  Depression Plan: 1.  Sertraline should be out of her system by tomorrow.  I am concerned that she is still having runs of NSVT.  I will defer to Dr. Graciela Husbands but most likely she will need AICD placement prior to discharge. 2.  I will contact her primary MD in regards to starting another type of antidepressant.  She is very tearful over some unfortunate family issues going on right now.    Quintella Reichert, MD  03/04/2012  9:29 AM

## 2012-03-05 ENCOUNTER — Encounter (HOSPITAL_COMMUNITY): Payer: Self-pay | Admitting: Internal Medicine

## 2012-03-05 ENCOUNTER — Encounter (HOSPITAL_COMMUNITY)
Admission: AD | Disposition: A | Discharge status: Home / Self Care | Payer: Self-pay | Source: Ambulatory Visit | Attending: Cardiology

## 2012-03-05 DIAGNOSIS — I472 Ventricular tachycardia: Secondary | ICD-10-CM

## 2012-03-05 HISTORY — PX: IMPLANTABLE CARDIOVERTER DEFIBRILLATOR IMPLANT: SHX5473

## 2012-03-05 LAB — BASIC METABOLIC PANEL
BUN: 20 mg/dL (ref 6–23)
Calcium: 9.3 mg/dL (ref 8.4–10.5)
Creatinine, Ser: 0.92 mg/dL (ref 0.50–1.10)
GFR calc Af Amer: 75 mL/min — ABNORMAL LOW (ref >90)
GFR calc non Af Amer: 65 mL/min — ABNORMAL LOW (ref >90)
Potassium: 4 mEq/L (ref 3.5–5.1)

## 2012-03-05 SURGERY — IMPLANTABLE CARDIOVERTER DEFIBRILLATOR IMPLANT
Anesthesia: LOCAL

## 2012-03-05 MED ORDER — MIDAZOLAM HCL 5 MG/5ML IJ SOLN
INTRAMUSCULAR | Status: AC
  Filled 2012-03-05: qty 5

## 2012-03-05 MED ORDER — FENTANYL CITRATE 0.05 MG/ML IJ SOLN
INTRAMUSCULAR | Status: AC
  Filled 2012-03-05: qty 2

## 2012-03-05 MED ORDER — SODIUM CHLORIDE 0.9 % IJ SOLN: 3.0000 mL | Freq: Two times a day (BID) | INTRAMUSCULAR | Status: DC

## 2012-03-05 MED ORDER — SODIUM CHLORIDE 0.9 % IV SOLN: INTRAVENOUS | Status: AC

## 2012-03-05 MED ORDER — ZOLPIDEM TARTRATE 5 MG PO TABS: 5.0000 mg | ORAL_TABLET | Freq: Every evening | ORAL | Status: DC | PRN

## 2012-03-05 MED ORDER — DIAZEPAM 5 MG PO TABS
10.0000 mg | ORAL_TABLET | Freq: Once | ORAL | Status: AC
  Administered 2012-03-05: 10 mg via ORAL
  Filled 2012-03-05: qty 2

## 2012-03-05 MED ORDER — WARFARIN SODIUM 5 MG PO TABS: 5.0000 mg | ORAL_TABLET | Freq: Every day | ORAL | Status: DC

## 2012-03-05 MED ORDER — CHLORHEXIDINE GLUCONATE 4 % EX LIQD: 60.0000 mL | Freq: Once | CUTANEOUS | Status: DC

## 2012-03-05 MED ORDER — WARFARIN SODIUM 5 MG PO TABS
5.0000 mg | ORAL_TABLET | ORAL | Status: DC
  Filled 2012-03-05: qty 1

## 2012-03-05 MED ORDER — CEFAZOLIN SODIUM 1-5 GM-% IV SOLN
INTRAVENOUS | Status: AC
  Filled 2012-03-05: qty 50

## 2012-03-05 MED ORDER — ACETAMINOPHEN 325 MG PO TABS: 325.0000 mg | ORAL_TABLET | ORAL | Status: DC | PRN

## 2012-03-05 MED ORDER — CEFAZOLIN SODIUM-DEXTROSE 2-3 GM-% IV SOLR: 2.0000 g | INTRAVENOUS | Status: DC

## 2012-03-05 MED ORDER — TRAMADOL HCL 50 MG PO TABS: 50.0000 mg | ORAL_TABLET | Freq: Four times a day (QID) | ORAL | Status: DC | PRN

## 2012-03-05 MED ORDER — CEFAZOLIN SODIUM 1-5 GM-% IV SOLN
1.0000 g | Freq: Four times a day (QID) | INTRAVENOUS | Status: AC
  Administered 2012-03-05 – 2012-03-06 (×3): 1 g via INTRAVENOUS
  Filled 2012-03-05 (×3): qty 50

## 2012-03-05 MED ORDER — SODIUM CHLORIDE 0.9 % IR SOLN
80.0000 mg | Status: DC
  Filled 2012-03-05 (×2): qty 2

## 2012-03-05 MED ORDER — WARFARIN - PHYSICIAN DOSING INPATIENT: Freq: Every day | Status: DC

## 2012-03-05 MED ORDER — VITAMIN B-12 100 MCG PO TABS
100.0000 ug | ORAL_TABLET | Freq: Every day | ORAL | Status: DC
  Administered 2012-03-06: 10:00:00 100 ug via ORAL
  Filled 2012-03-05: qty 1

## 2012-03-05 MED ORDER — WARFARIN SODIUM 7.5 MG PO TABS: 7.5000 mg | ORAL_TABLET | ORAL | Status: DC

## 2012-03-05 MED ORDER — ONDANSETRON HCL 4 MG/2ML IJ SOLN: 4.0000 mg | Freq: Four times a day (QID) | INTRAMUSCULAR | Status: DC | PRN

## 2012-03-05 MED ORDER — ADULT MULTIVITAMIN W/MINERALS CH
1.0000 | ORAL_TABLET | Freq: Every day | ORAL | Status: DC
  Administered 2012-03-06: 1 via ORAL
  Filled 2012-03-05: qty 1

## 2012-03-05 MED ORDER — SODIUM CHLORIDE 0.9 % IJ SOLN: 3.0000 mL | INTRAMUSCULAR | Status: DC | PRN

## 2012-03-05 MED ORDER — ALPRAZOLAM 0.25 MG PO TABS
0.5000 mg | ORAL_TABLET | Freq: Every evening | ORAL | Status: DC | PRN
  Filled 2012-03-05: qty 2

## 2012-03-05 MED ORDER — SODIUM CHLORIDE 0.9 % IV SOLN: 250.0000 mL | INTRAVENOUS | Status: DC

## 2012-03-05 MED ORDER — HEPARIN (PORCINE) IN NACL 2-0.9 UNIT/ML-% IJ SOLN
INTRAMUSCULAR | Status: AC
  Filled 2012-03-05: qty 500

## 2012-03-05 MED ORDER — LIDOCAINE HCL (PF) 1 % IJ SOLN
INTRAMUSCULAR | Status: AC
  Filled 2012-03-05: qty 60

## 2012-03-05 NOTE — Progress Notes (Signed)
SUBJECTIVE:  No complaints at present  OBJECTIVE:   Vitals:   Filed Vitals:   03/05/12 0000 03/05/12 0402 03/05/12 0839 03/05/12 0937  BP: 110/54 117/57 102/56 109/74  Pulse: 64 65 63 64  Temp:  97.6 F (36.4 C) 97.7 F (36.5 C)   TempSrc:  Oral Oral   Resp: 17 16    Height:      Weight:      SpO2:  98% 96%    I&O's:   Intake/Output Summary (Last 24 hours) at 03/05/12 1127 Last data filed at 03/04/12 1800  Gross per 24 hour  Intake    600 ml  Output      0 ml  Net    600 ml   TELEMETRY: Reviewed telemetry pt in NSR:     PHYSICAL EXAM General: Well developed, well nourished, in no acute distress Head: Eyes PERRLA, No xanthomas.   Normal cephalic and atramatic  Lungs:   Clear bilaterally to auscultation and percussion. Heart:   HRRR S1 S2 Pulses are 2+ & equal. Abdomen: Bowel sounds are positive, abdomen soft and non-tender without masses  Extremities:   No clubbing, cyanosis or edema.  DP +1 Neuro: Alert and oriented X 3. Psych:  Good affect, responds appropriately   LABS: Basic Metabolic Panel:  Basename 03/05/12 0540 03/04/12 0505  NA 140 142  K 4.0 4.8  CL 103 106  CO2 27 28  GLUCOSE 89 94  BUN 20 21  CREATININE 0.92 1.03  CALCIUM 9.3 9.4  MG -- --  PHOS -- --    Lab Results  Component Value Date   INR 1.92* 03/01/2012   INR 1.82* 02/29/2012   INR 1.89* 11/20/2010    RADIOLOGY: No results found.  Assessment:  1. Ventricular tachycardia, polymorphic - 427.1 (Primary), She appears to be having polymorphic VT which is causing presyncope and syncopal episodes. She had a cath in 2006 which showed normal coronary arteries. She has a history of mild LV dysfunction with EF 40% by echo in 2012 and also a history of PFO. Sertraline is now discontinued but QTc still appears prolonged. Plans for AICD placement by Dr. Graciela Husbands  2. Benign hypertensive heart disease without heart failure - 402.10 - controlled 3. Ostium secundum type atrial septal defect - 745.5  4.  Depression  Plan:  1. She is scheduled to get AICD implant by Dr. Graciela Husbands 2.  Continue Inderal LA   Quintella Reichert, MD  03/05/2012  11:27 AM

## 2012-03-05 NOTE — Progress Notes (Signed)
  Patient Name: Michelle Castro      SUBJECTIVE: depressed and weeping  Past Medical History  Diagnosis Date  . Hypertension   . Depression/ anxiety   . Syncope   . Headache     MIGRAINES  . Cancer 1986    BREAST CANCER  . Arthritis   . Dementia   . PFO (patent foramen ovale)   . 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 Ventricular tachycardia, polymorphic     long-short    PHYSICAL EXAM Filed Vitals:   03/04/12 2351 03/05/12 0000 03/05/12 0402 03/05/12 0839  BP:  110/54 117/57 102/56  Pulse:  64 65 63  Temp: 97.4 F (36.3 C)  97.6 F (36.4 C) 97.7 F (36.5 C)  TempSrc: Oral  Oral Oral  Resp:  17 16   Height:      Weight:      SpO2: 95%  98% 96%    Well developed and nourished in no acute distress HENT normal Neck supple with JVP-flat Clear Regular rate and rhythm, no murmurs or gallops Abd-soft with active BS No Clubbing cyanosis edema Skin-warm and dry A & Oriented  Grossly normal sensory and motor function  TELEMETRY: Reviewed telemetry pt in  Still with polymorphic couplets  QTc about 500:    Intake/Output Summary (Last 24 hours) at 03/05/12 0909 Last data filed at 03/04/12 1800  Gross per 24 hour  Intake    600 ml  Output      0 ml  Net    600 ml    LABS: Basic Metabolic Panel:  Lab 03/05/12 4782 03/04/12 0505 03/02/12 0500 03/01/12 0505 02/29/12 1558  NA 140 142 138 144 140  K 4.0 4.8 4.4 4.1 3.6  CL 103 106 103 106 102  CO2 27 28 29 29 25   GLUCOSE 89 94 87 95 107*  BUN 20 21 24* 23 24*  CREATININE 0.92 1.03 0.95 1.05 0.94  CALCIUM 9.3 9.4 -- -- --  MG -- -- -- -- 2.2  PHOS -- -- -- -- --   Cardiac Enzymes: No results found for this basename: CKTOTAL:3,CKMB:3,CKMBINDEX:3,TROPONINI:3 in the last 72 hours CBC:  Lab 03/01/12 0505 02/29/12 1558  WBC 7.0 7.6  NEUTROABS -- 3.6  HGB 13.2 12.9  HCT 39.7 37.4  MCV 87.8 87.0  PLT 276 276   PROTIME:     ASSESSMENT AND PLAN:  Patient Active  Hospital Problem List: Polymorphic ventricular tachycardia (02/29/2012)   PFO (patent foramen ovale) (02/29/2012)   Essential hypertension (02/29/2012)    Breast Cancer s/p mastectomy   Depression (03/04/2012)   Pt continues with polymorphic couplets; with hx of syncope on betablockers and LOng QT persisting despite the removal of the sertraline I am concerned ongoing vulnerability of her electirical substrate.  Notwithstanding our changing betablockers i think the safest thing for her is ICD implantation because of refractory symptoms  Have reviewed the potential benefits and risks of ICD implantation including but not limited to death, perforation of heart or lung, lead dislodgement, infection,  device malfunction and inappropriate shocks.  The patient express understanding  and are willing to proceed.      Signed, Sherryl Manges MD  03/05/2012

## 2012-03-05 NOTE — Op Note (Signed)
NAME:  Michelle Castro, Michelle Castro NO.:  [id number]  MEDICAL RECORD NO.:  [id number]  LOCATION:  6525                         FACILITY:  [facility]  PHYSICIAN:  Duke Salvia, MD, FACCDATE OF BIRTH:  [birth year]  DATE OF PROCEDURE: DATE OF DISCHARGE:                              OPERATIVE REPORT   PREOPERATIVE DIAGNOSES:  Polymorphic ventricular tachycardia with QT prolongation - consistent with long QT syndrome.  POSTOPERATIVE DIAGNOSES:  Polymorphic ventricular tachycardia with QT prolongation - consistent with long QT syndrome with refractory symptoms despite beta-blockers.  PROCEDURE:  Dual-chamber defibrillator implantation with intraoperative defibrillation threshold testing.  PROCEDURE IN DETAIL:  Following obtaining informed consent, the patient was brought to the electrophysiology laboratory and placed on the fluoroscopic table in supine position.  After routine prep and drape of the right upper chest, the right side being used because of prior left mastectomy, left arm swelling and the incision was made and carried down to layer of the prepectoral fascia using electrocautery and sharp dissection.  A pocket was formed similarly.  Hemostasis was obtained.  Thereafter, attention was turned to gain access to the extrathoracic right subclavian vein, which was accomplished without difficulty without the aspiration or puncture of the artery.  Two separate venipunctures were accomplished.  Guidewires were placed and retained.  Sequentially, a 9-French and 7-French sheaths were placed, which were passed a [employer], Endotak SG lead model [id number] and a Medtronic [id number] active fixation atrial lead, PJ #[id number].  Under fluoroscopic guidance, these leads were manipulated with ventricular apex and the right atrial appendage respectively with a bipolar R-wave was 9.6 with a pace impedance of 946, a threshold 0.5 at 0.5, current at threshold was 0.5 mA.  There was  no diaphragmatic pacing at 10 V.  Current of injury was brisk.  The lead was secured to the prepectoral fascia.  Bipolar P-wave was 3.9 with a pace impedance of 962 with threshold 0.5 V at 0.5 milliseconds.  Current threshold was 1.5 mA.  There is no diaphragmatic pacing at 10 V.  The current of injury was brisk.  This lead was secured to the prepectoral fascia.  Then, the leads were attached to a Medtronic D 314 DRG defibrillator PSK [id number].  Through the device, bipolar R-wave was 10.8 with a pace impedance of 646, a threshold 0.5 at 0.4.  The right atrial amplitude was 3.4.  The pace impedance of 589, threshold 0.5 at 0.4.  High-voltage impedance was 80 ohms.  Defibrillation threshold testing was then to be undertaken. Prior to this, however, the device was implanted in the pocket.  The pocket was copiously irrigated with antibiotic containing saline solution.  Hemostasis having been assured.  Leads and pulse generator placed in the pocket secured to the prepectoral fascia and the wound was closed in 3 layers in normal fashion.  At this point, I scrubbed out and DFT testing was undertaken.  Ventricular fibrillation was induced via the T-wave shock.  After total duration of 6 seconds, a 15 joule shock was delivered through a measured resistance of 81 ohms. Sinus rhythm was restored.  The patient tolerated the procedure without apparent complication.     Duke Salvia, MD,  [facility]     SCK/MEDQ  D:  03/05/2012  T:  03/05/2012  Job:  [id number]

## 2012-03-05 NOTE — Progress Notes (Signed)
PHARMACIST - PHYSICIAN COMMUNICATION  CONCERNING: Pharmacy Care Issues Regarding Warfarin Labs  RECOMMENDATION (Action Taken): A baseline and daily protime for three days has been ordered to meet the [facility] Patient safety goal and comply with the current [facility] Pharmacy & Therapeutics Committee policy.   The Pharmacy will defer all warfarin dose order changes and follow up of lab results to the prescriber unless an additional order to initiate a "pharmacy Coumadin consult" is placed.  DESCRIPTION:  While hospitalized, to be in compliance with The Joint Commission National Patient Safety Goals, all patients on warfarin must have a baseline and/or current protime prior to the administration of warfarin. Pharmacy has received your order for warfarin without these required laboratory assessments.   Harland German, Pharm D 03/05/2012 4:47 PM

## 2012-03-05 NOTE — CV Procedure (Signed)
Michelle Castro [id number]  [id number]  Preop Dx: LQTS  VT-polymorphic despite beta blockers with syncope Postop Dx same/  Procedure: dual chamber ICD implantation with DFT testing  Cx: None   Dictation number [id number]  Sherryl Manges, MD 03/05/2012 3:01 PM

## 2012-03-06 ENCOUNTER — Inpatient Hospital Stay (HOSPITAL_COMMUNITY)

## 2012-03-06 LAB — PROTIME-INR
INR: 1.05 (ref 0.00–1.49)
Prothrombin Time: 13.6 seconds (ref 11.6–15.2)

## 2012-03-06 LAB — BASIC METABOLIC PANEL
CO2: 22 mEq/L (ref 19–32)
Chloride: 105 mEq/L (ref 96–112)
Creatinine, Ser: 0.88 mg/dL (ref 0.50–1.10)
GFR calc Af Amer: 79 mL/min — ABNORMAL LOW (ref >90)
Potassium: 4.3 mEq/L (ref 3.5–5.1)

## 2012-03-06 MED ORDER — OFF THE BEAT BOOK
Freq: Once | Status: AC
  Administered 2012-03-06: 06:00:00
  Filled 2012-03-06: qty 1

## 2012-03-06 MED ORDER — YOU HAVE A PACEMAKER BOOK
Freq: Once | Status: AC
  Administered 2012-03-06: 06:00:00
  Filled 2012-03-06: qty 1

## 2012-03-06 MED ORDER — PROPRANOLOL HCL ER 160 MG PO CP24: 160.0000 mg | ORAL_CAPSULE | Freq: Every day | ORAL | Status: DC

## 2012-03-06 MED ORDER — ALPRAZOLAM 1 MG PO TABS: ORAL_TABLET | ORAL | Status: DC

## 2012-03-06 NOTE — Progress Notes (Signed)
   ELECTROPHYSIOLOGY ROUNDING NOTE    Patient Name: Michelle Castro Date of Encounter: 03-06-2012    SUBJECTIVE:Patient feels well.  No chest pain or shortness of breath.  S/p dual chamber ICD insertion 03-05-12 for polymorphic VT with QT prolongation.  Minimal incisional soreness.    TELEMETRY: Reviewed telemetry pt atrial pacing with intrinsic ventricular conduction, some couplets Filed Vitals:   03/06/12 0500 03/06/12 0625 03/06/12 0745 03/06/12 0755  BP:  137/66 123/62   Pulse:      Temp:  97.4 F (36.3 C)  97.5 F (36.4 C)  TempSrc:  Oral  Oral  Resp: 17 15 16    Height:      Weight:  199 lb 11.8 oz (90.6 kg)    SpO2:  98%  98%    Intake/Output Summary (Last 24 hours) at 03/06/12 2956 Last data filed at 03/06/12 0000  Gross per 24 hour  Intake    240 ml  Output    750 ml  Net   -510 ml    LABS: Basic Metabolic Panel:  Basename 03/06/12 0605 03/05/12 0540  NA 139 140  K 4.3 4.0  CL 105 103  CO2 22 27  GLUCOSE 93 89  BUN 22 20  CREATININE 0.88 0.92  CALCIUM 9.3 9.3  MG -- --  PHOS -- --   Radiology/Studies:  Dg Chest 2 View 03/06/2012  *RADIOLOGY REPORT*  Clinical Data: Post pacemaker placement  CHEST - 2 VIEW  Comparison: 04/29/06  Findings: Cardiomediastinal silhouette is unremarkable.  No acute infiltrate or pleural effusion.  No pulmonary edema.  Surgical clips are noted in left axilla.  There is a dual lead cardiac pacemaker with right subclavian approach with leads in the right atrium and right ventricle.  No diagnostic pneumothorax.  IMPRESSION: No active disease.  Dual lead cardiac pacemaker in place.   Original Report Authenticated By: Natasha Mead, M.D.    PHYSICAL EXAM BP 123/62  Pulse 64  Temp 97.5 F (36.4 C) (Oral)  Resp 16  Ht 5\' 7"  (1.702 m)  Wt 199 lb 11.8 oz (90.6 kg)  BMI 31.28 kg/m2  SpO2 98% Well developed and nourished in no acute distress HENT normal Neck supple with JVP-flat Clear Regular rate and rhythm, no murmurs or  gallops Abd-soft with active BS No Clubbing cyanosis edema Skin-warm and dry A & Oriented  Grossly normal sensory and motor function  Right chest without hematoma or ecchymosis.   DEVICE INTERROGATION: Device interrogated by industry.  Lead values including impedence, sensing, threshold within normal values.    Wound care, arm mobility, shock plan, restrictions, instructions reviewed with patient.  Routine follow up scheduled.    Need genetic testing   Will arrange as outpt

## 2012-03-06 NOTE — Progress Notes (Signed)
Retro Utilization Review Completed. Riko Lumsden J. Aydan Phoenix, RN, BSN, NCM [phone].  

## 2012-03-07 NOTE — Discharge Summary (Signed)
Patient ID: Michelle Castro MRN: [id number] DOB/AGE: Jun 30, 1950 61 y.o.  Admit date: 02/29/2012 Discharge date: 03/07/2012  Primary Discharge Diagnosis Polymorphic ventricular tachycardia secondary to prolonged QT syndrome and meds  Secondary Discharge Diagnosis  HTN  Normal coronary arteries with aberrant takeoff of RCA off left coronary cusp  Breast CA  PFO  Significant Diagnostic Studies: None  Consults: cardiology Dr. Beverly Sessions Course: This is a 62yo WF with a history of presumed PAF ( heart monitor in the past with short bursts of wide complex tachcyardia that was slightly irregular and felt to be afib with aberration), systemic anticoagulation, HTN who recently was having problems with increased palpitations and dizziness.  She has a history of normal coronary arteries with aberrant takeoff of RCA off left coronary cusp and mild LV dysfunction EF 40%.  She was noted on Lifewatch monitor to have polymorphic VT and was admitted to [facility].  Her potassium and Mg levels were normal.  An EP consult was obtained and it was felt that this was caused by the Sertraline she had been on for depression and it was stopped.  Despite stopping Sertraline for several days, her QTc remained prolonged and she continued to have short bursts of polymorphic VT.  She underwent dual chamber AICD placement successfully without complications.  On the day of discharge she was ambulating without difficulty.  Of note after reviewing all strips from prior heart monitors it was felt by Dr. Graciela Husbands that the other arrhythmias were most likely VT and not PAF with aberration and recommended that her anticoagulation be stopped.  Her Atenolol was changed to Inderal.  She was instructed to call her primary MD to discuss other possible treatments for her depression.   Discharge Exam: Blood pressure 123/62, pulse 64, temperature 97.5 F (36.4 C), temperature source Oral, resp. rate 16, height 5\' 7"  (1.702 m), weight 90.6 kg  (199 lb 11.8 oz), SpO2 98.00%.   General appearance: alert Resp: clear to auscultation bilaterally Chest wall: pacer site over right chest wall clean, dry and intact with no hematoma Cardio: regular rate and rhythm, S1, S2 normal, no murmur, click, rub or gallop GI: soft, non-tender; bowel sounds normal; no masses,  no organomegaly Extremities: extremities normal, atraumatic, no cyanosis or edema Labs:   Lab Results  Component Value Date   WBC 7.0 03/01/2012   HGB 13.2 03/01/2012   HCT 39.7 03/01/2012   MCV 87.8 03/01/2012   PLT 276 03/01/2012    Lab 03/06/12 0605 02/29/12 1558  NA 139 --  K 4.3 --  CL 105 --  CO2 22 --  BUN 22 --  CREATININE 0.88 --  CALCIUM 9.3 --  PROT -- 7.1  BILITOT -- 0.2*  ALKPHOS -- 79  ALT -- 20  AST -- 25  GLUCOSE 93 --   Lab Results  Component Value Date   TROPONINI <0.30 02/29/2012     No results found for this basename: LDLDIRECT      Radiology:  *RADIOLOGY REPORT*  Clinical Data: Post pacemaker placement  CHEST - 2 VIEW  Comparison: 04/29/06  Findings: Cardiomediastinal silhouette is unremarkable. No acute  infiltrate or pleural effusion. No pulmonary edema. Surgical  clips are noted in left axilla. There is a dual lead cardiac  pacemaker with right subclavian approach with leads in the right  atrium and right ventricle. No diagnostic pneumothorax.  IMPRESSION:  No active disease. Dual lead cardiac pacemaker in place.  Original Report Authenticated By: Natasha Mead, M.D.  EKG:  A paced with PVC's    FOLLOW UP PLANS AND APPOINTMENTS Discharge Orders    Future Appointments: Provider: Department: Dept Phone: Center:   03/13/2012 10:00 AM [facility] Main Office [place]) [phone] [facility]   06/24/2012 2:45 PM Duke Salvia, MD [facility] Main Office [place]) [phone] [facility]     Future Orders Please Complete By Expires   Diet - low sodium heart healthy      Increase activity slowly           Medication List     As of 03/07/2012  7:51 AM    STOP taking these medications         metoprolol succinate 50 MG 24 hr tablet   Commonly known as: TOPROL-XL      potassium chloride SA 20 MEQ tablet   Commonly known as: K-DUR,KLOR-CON      sertraline 100 MG tablet   Commonly known as: ZOLOFT      triamterene-hydrochlorothiazide 37.5-25 MG per tablet   Commonly known as: MAXZIDE-25      warfarin 5 MG tablet   Commonly known as: COUMADIN      zolpidem 10 MG tablet   Commonly known as: AMBIEN      TAKE these medications         ALPRAZolam 1 MG tablet   Commonly known as: XANAX   Take 1/2 tablet at 3 times daily as needed for anxiety      celecoxib 200 MG capsule   Commonly known as: CELEBREX   Take 200 mg by mouth daily as needed. For pain      multivitamin with minerals Tabs   Take 1 tablet by mouth daily.      propranolol ER 160 MG SR capsule   Commonly known as: INDERAL LA   Take 1 capsule (160 mg total) by mouth daily.      traMADol 50 MG tablet   Commonly known as: ULTRAM   Take 50 mg by mouth every 6 (six) hours as needed. For pain      vitamin B-12 100 MCG tablet   Commonly known as: CYANOCOBALAMIN   Take 100 mcg by mouth daily.           Follow-up Information    Follow up with Quintella Reichert, MD. On 03/20/2012. (10:00am)    Contact information:   [address] [place] [address] [phone]       Call Gaspar Garbe, MD. (call for appointment in next week to discuss treatemtn of depression)    Contact information:   [address] [facility] [place] [address] [phone]       Follow up with [facility] On 03/13/2012. (Wound check and device check at 10:00 am)    Contact information:   [address] [place] [address]       Follow up with Sherryl Manges, MD. On 06/24/2012. (at 2:45 pm)    Contact information:   [address] [place]  [address] [phone]          BRING ALL MEDICATIONS WITH YOU TO FOLLOW UP APPOINTMENTS  Time spent with patient to include physician time:35 minutes Signed: Quintella Reichert 03/07/2012, 7:51 AM

## 2012-03-10 ENCOUNTER — Ambulatory Visit

## 2012-03-13 ENCOUNTER — Encounter: Payer: Self-pay | Admitting: Internal Medicine

## 2012-03-13 ENCOUNTER — Ambulatory Visit (INDEPENDENT_AMBULATORY_CARE_PROVIDER_SITE_OTHER): Admitting: *Deleted

## 2012-03-13 DIAGNOSIS — I472 Ventricular tachycardia: Secondary | ICD-10-CM

## 2012-03-13 LAB — ICD DEVICE OBSERVATION
AL AMPLITUDE: 3.25 mv
AL THRESHOLD: 0.5 V
BAMS-0001: 170 {beats}/min
BATTERY VOLTAGE: 3.1952 V
FVT: 0
HV IMPEDENCE: 57 Ohm
PACEART VT: 0
RV LEAD AMPLITUDE: 9.625 mv
RV LEAD IMPEDENCE ICD: 399 Ohm
TZAT-0001ATACH: 1
TZAT-0001FASTVT: 1
TZAT-0001SLOWVT: 1
TZAT-0002ATACH: NEGATIVE
TZAT-0012ATACH: 150 ms
TZAT-0012ATACH: 150 ms
TZAT-0012ATACH: 150 ms
TZAT-0012FASTVT: 170 ms
TZAT-0012SLOWVT: 170 ms
TZAT-0018ATACH: NEGATIVE
TZAT-0018FASTVT: NEGATIVE
TZAT-0018SLOWVT: NEGATIVE
TZAT-0019ATACH: 6 V
TZAT-0019FASTVT: 8 V
TZAT-0020ATACH: 1.5 ms
TZAT-0020ATACH: 1.5 ms
TZON-0003ATACH: 350 ms
TZON-0003SLOWVT: 360 ms
TZON-0004SLOWVT: 16
TZST-0001ATACH: 4
TZST-0001FASTVT: 2
TZST-0001FASTVT: 4
TZST-0001FASTVT: 6
TZST-0001SLOWVT: 4
TZST-0001SLOWVT: 5
TZST-0001SLOWVT: 6
TZST-0002ATACH: NEGATIVE
TZST-0002ATACH: NEGATIVE
TZST-0002FASTVT: NEGATIVE
TZST-0002FASTVT: NEGATIVE
TZST-0002FASTVT: NEGATIVE
TZST-0002FASTVT: NEGATIVE
TZST-0002SLOWVT: NEGATIVE
TZST-0002SLOWVT: NEGATIVE
VF: 0

## 2012-03-13 NOTE — Progress Notes (Signed)
Wound check-ICD 

## 2012-04-10 ENCOUNTER — Telehealth: Payer: Self-pay | Admitting: Internal Medicine

## 2012-04-10 NOTE — Telephone Encounter (Signed)
I spoke with the patient. She is aware that genetic testing was not done in the hospital. She will try to come tomorrow for a gene dx test to be done. I will have her paperwork in the lab for Mellody Dance with her Gene DX test kit. The patient is aware that I need the front and back of her insurance card to go with her.

## 2012-04-10 NOTE — Telephone Encounter (Signed)
New Problem:    Patient had some genetic testing performed in the hospital and would like to know if they were going to be sent to Dr. Shary Key.  Please call back.

## 2012-04-14 ENCOUNTER — Other Ambulatory Visit

## 2012-06-02 ENCOUNTER — Encounter: Payer: Self-pay | Admitting: *Deleted

## 2012-06-09 ENCOUNTER — Telehealth: Payer: Self-pay | Admitting: Internal Medicine

## 2012-06-09 NOTE — Telephone Encounter (Signed)
Left message for pt, will make sure Sherri Rad is aware. She is covering [place] that day. Pt to call with questions prior to appt.

## 2012-06-09 NOTE — Telephone Encounter (Signed)
New Prob      Pt is requesting to have her genetic testing done the same day she is coming in to see Dr. Graciela Husbands 5/20.

## 2012-06-16 ENCOUNTER — Encounter: Payer: Self-pay | Admitting: *Deleted

## 2012-06-24 ENCOUNTER — Encounter: Payer: Self-pay | Admitting: Internal Medicine

## 2012-06-24 ENCOUNTER — Ambulatory Visit (INDEPENDENT_AMBULATORY_CARE_PROVIDER_SITE_OTHER): Admitting: Internal Medicine

## 2012-06-24 VITALS — BP 141/84 | HR 84 | Ht 67.0 in | Wt 197.1 lb

## 2012-06-24 DIAGNOSIS — I4581 Long QT syndrome: Secondary | ICD-10-CM

## 2012-06-24 DIAGNOSIS — I428 Other cardiomyopathies: Secondary | ICD-10-CM

## 2012-06-24 DIAGNOSIS — Z95810 Presence of automatic (implantable) cardiac defibrillator: Secondary | ICD-10-CM

## 2012-06-24 DIAGNOSIS — I5022 Chronic systolic (congestive) heart failure: Secondary | ICD-10-CM

## 2012-06-24 DIAGNOSIS — I472 Ventricular tachycardia: Secondary | ICD-10-CM

## 2012-06-24 LAB — ICD DEVICE OBSERVATION
AL AMPLITUDE: 2.125 mv
ATRIAL PACING ICD: 10.48 pct
BAMS-0001: 170 {beats}/min
CHARGE TIME: 2.672 s
FVT: 0
PACEART VT: 0
RV LEAD IMPEDENCE ICD: 513 Ohm
TOT-0001: 1
TZAT-0001ATACH: 3
TZAT-0001FASTVT: 1
TZAT-0002ATACH: NEGATIVE
TZAT-0012ATACH: 150 ms
TZAT-0019ATACH: 6 V
TZAT-0020ATACH: 1.5 ms
TZAT-0020ATACH: 1.5 ms
TZAT-0020ATACH: 1.5 ms
TZAT-0020SLOWVT: 1.5 ms
TZON-0003SLOWVT: 360 ms
TZON-0003VSLOWVT: 400 ms
TZON-0004SLOWVT: 16
TZST-0001ATACH: 6
TZST-0001FASTVT: 2
TZST-0001FASTVT: 4
TZST-0001SLOWVT: 2
TZST-0001SLOWVT: 3
TZST-0002ATACH: NEGATIVE
TZST-0002ATACH: NEGATIVE
TZST-0002FASTVT: NEGATIVE
TZST-0002FASTVT: NEGATIVE
TZST-0002SLOWVT: NEGATIVE
TZST-0002SLOWVT: NEGATIVE

## 2012-06-24 NOTE — Patient Instructions (Signed)
Remote monitoring is used to monitor your Pacemaker of ICD from home. This monitoring reduces the number of office visits required to check your device to one time per year. It allows [place] to keep an eye on the functioning of your device to ensure it is working properly. You are scheduled for a device check from home on 09/29/12. You may send your transmission at any time that day. If you have a wireless device, the transmission will be sent automatically. After your physician reviews your transmission, you will receive a postcard with your next transmission date.  Your physician wants you to follow-up in: January 2015 with Dr. Graciela Husbands. You will receive a reminder letter in the mail two months in advance. If you don't receive a letter, please call our office to schedule the follow-up appointment.  Your physician recommends that you have lab work today: Gene DX testing.

## 2012-06-24 NOTE — Progress Notes (Signed)
ELECTROPHYSIOLOGY OFFICE NOTE  Patient ID: Michelle Castro MRN: [id number], DOB/AGE: 08/17/1950   Date of Visit: 06/24/2012  Primary Physician: Renford Dills, MD Primary Cardiologist: Armanda Magic, MD Primary EP: Graciela Husbands, MD Reason for Visit: EP/device follow-up  History of Present Illness  Michelle Castro is a pleasant 62 year old woman with previously diagnosed AF (heart monitor in the past with short bursts of wide complex tachcyardia that was slightly irregular and felt to be AF with aberration), normal coronaries with aberrant takeoff of RCA from left coronary cusp, NICM with EF 40-45% and HTN who recently noted increased palpitations and dizziness. She was evaluated by Dr. Mayford Knife and a Lifewatch monitor was ordered. She was found to have polymorphic VT and was admitted to [facility]. Her potassium and Mg levels were normal. She had QT prolongation on ECG. An EP consult was obtained and Dr. Graciela Husbands felt that her prolonged QT may be due to sertraline which she was taking for depression. This was stopped. Despite stopping sertraline for several days, her QT remained prolonged and she continued to have short bursts of polymorphic VT. She underwent dual chamber ICD placement on 03/05/2012. Of note, after reviewing all strips from prior heart monitors Dr. Graciela Husbands determined the other arrhythmias were most likely VT and not PAF with aberration and he recommended that her anticoagulation be stopped. She has also been referred for genetic testing.  Michelle Castro presents today for routine electrophysiology followup. She reports she is doing well. She has no complaints. She denies chest pain or shortness of breath. She denies palpitations, dizziness, near syncope or syncope. She denies LE swelling, orthopnea, PND or recent weight gain. She reports that she is compliant and tolerating medications without difficulty.  Past Medical History Past Medical History  Diagnosis Date  . Hypertension   . Depression/  anxiety   . Syncope   . Headache     MIGRAINES  . Breast cancer 1986    s/p :L mastectomy  . Arthritis   . Dementia   . PFO (patent foramen ovale)   . 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 Ventricular tachycardia, polymorphic     long-short  . Long Q-T syndrome     gene testing pending  . Long Q-T syndrome Jan 2014    s/p ICD implant    Past Surgical History Past Surgical History  Procedure Laterality Date  . Abdominal hysterectomy    . Arthoplasty    . Total knee arthroplasty      bilateral  . Breast surgery      mastectomy    Allergies/Intolerances Allergies  Allergen Reactions  . Codeine Nausea Only   Current Home Medications Current Outpatient Prescriptions  Medication Sig Dispense Refill  . ALPRAZolam (XANAX) 1 MG tablet Take 1/2 tablet at 3 times daily as needed for anxiety  30 tablet  1  . celecoxib (CELEBREX) 200 MG capsule Take 200 mg by mouth daily as needed. For pain      . Multiple Vitamin (MULTIVITAMIN WITH MINERALS) TABS Take 1 tablet by mouth daily.      . propranolol ER (INDERAL LA) 160 MG SR capsule Take 1 capsule (160 mg total) by mouth daily.  30 capsule  11  . spironolactone (ALDACTONE) 25 MG tablet Take 25 mg by mouth daily.      . traMADol (ULTRAM) 50 MG tablet Take 50 mg by mouth every 6 (six) hours as needed. For pain      .  vitamin B-12 (CYANOCOBALAMIN) 100 MCG tablet Take 100 mcg by mouth daily.       No current facility-administered medications for this visit.   Social History Social History  . Marital Status: Married   Social History Main Topics  . Smoking status: Never Smoker   . Smokeless tobacco: Never Used  . Alcohol Use: No  . Drug Use: No   Review of Systems General: No chills, fever, night sweats or weight changes Cardiovascular: No chest pain, dyspnea on exertion, edema, orthopnea, palpitations, paroxysmal nocturnal dyspnea Dermatological: No rash, lesions or masses Respiratory: No  cough, dyspnea Urologic: No hematuria, dysuria Abdominal: No nausea, vomiting, diarrhea, bright red blood per rectum, melena, or hematemesis Neurologic: No visual changes, weakness, changes in mental status All other systems reviewed and are otherwise negative except as noted above.  Physical Exam Blood pressure 141/84, pulse 84, height 5\' 7"  (1.702 m), weight 197 lb 1.9 oz (89.413 kg).  General: Well developed, well appearing 62 year old female in no acute distress. HEENT: Normocephalic, atraumatic. EOMs intact. Sclera nonicteric. Oropharynx clear.  Neck: Supple. No JVD. Lungs: Respirations regular and unlabored, CTA bilaterally. No wheezes, rales or rhonchi. Heart: RRR. S1, S2 present. No murmurs, rub, S3 or S4. Abdomen: Soft, non-distended.  Extremities: No clubbing, cyanosis or edema. PT/Radials 2+ and equal bilaterally. Psych: Normal affect. Neuro: Alert and oriented X 3. Moves all extremities spontaneously.   Diagnostics 12-lead ECG today shows SR with RBBB, bifascicular block; new when compared to previous ECGs from Jan 2014 Device interrogation today -Normal device function. Thresholds and sensing consistent with previous device measurements. Impedance trends stable over time. 4 NSVT episodes recorded, EGMs show 1:1 SVT. No mode switches. Histogram distribution appropriate for patient and level of activity. No changes made this session. Device programmed at appropriate safety margins. Device programmed to optimize intrinsic conduction. Optivol and thoracic impedance abnormal 5/9 although patient is asymptomatic.      Assessment and Plan 1. Probable long QT syndrome GeneDx testing to be initiated today 2. Polymorphic VT s/p ICD implantation Normal device function No programming changes made No driving for at least 6 months post syncope/PMVT episode Jan 2014 3. NICM, EF 40-45%, with chronic systolic HF OptiVol increasing since 06/13/2012 but Ms. Nekervis is asymptomatic, does not  report any HF symptoms and is euvolemic by exam  As above

## 2012-08-05 ENCOUNTER — Telehealth: Payer: Self-pay | Admitting: *Deleted

## 2012-08-05 NOTE — Telephone Encounter (Signed)
I left a message for the patient to call regarding the results of her GeneDX testing. I advised I will be back on Thursday and asked that she call me back then.

## 2012-08-12 NOTE — Telephone Encounter (Signed)
Follow-up:    Patient called in returning Heather's call.  Please call back.

## 2012-08-12 NOTE — Telephone Encounter (Signed)
Spoke with pt, aware to call back Thursday to speak with heather. Pt agreed.

## 2012-08-14 NOTE — Telephone Encounter (Signed)
New Prob     Pt has some questions regarding genetic testing. Please call.

## 2012-08-14 NOTE — Telephone Encounter (Signed)
I spoke with the patient regarding her GeneDX testing.

## 2012-09-26 ENCOUNTER — Encounter: Payer: Self-pay | Admitting: *Deleted

## 2012-09-29 ENCOUNTER — Encounter

## 2012-09-30 ENCOUNTER — Encounter: Admitting: Internal Medicine

## 2012-10-09 ENCOUNTER — Encounter: Payer: Self-pay | Admitting: Internal Medicine

## 2012-12-11 ENCOUNTER — Other Ambulatory Visit: Payer: Self-pay

## 2013-01-23 ENCOUNTER — Telehealth: Payer: Self-pay | Admitting: Cardiology

## 2013-01-23 ENCOUNTER — Emergency Department (HOSPITAL_COMMUNITY)
Admission: EM | Admit: 2013-01-23 | Discharge: 2013-01-23 | Disposition: A | Discharge status: Home / Self Care | Attending: Emergency Medicine | Admitting: Emergency Medicine

## 2013-01-23 ENCOUNTER — Emergency Department (HOSPITAL_COMMUNITY)

## 2013-01-23 ENCOUNTER — Encounter (HOSPITAL_COMMUNITY): Payer: Self-pay | Admitting: Emergency Medicine

## 2013-01-23 DIAGNOSIS — Z79899 Other long term (current) drug therapy: Secondary | ICD-10-CM | POA: Insufficient documentation

## 2013-01-23 DIAGNOSIS — F3289 Other specified depressive episodes: Secondary | ICD-10-CM | POA: Insufficient documentation

## 2013-01-23 DIAGNOSIS — R0789 Other chest pain: Secondary | ICD-10-CM | POA: Insufficient documentation

## 2013-01-23 DIAGNOSIS — F0390 Unspecified dementia without behavioral disturbance: Secondary | ICD-10-CM | POA: Insufficient documentation

## 2013-01-23 DIAGNOSIS — F411 Generalized anxiety disorder: Secondary | ICD-10-CM | POA: Insufficient documentation

## 2013-01-23 DIAGNOSIS — I4510 Unspecified right bundle-branch block: Secondary | ICD-10-CM | POA: Insufficient documentation

## 2013-01-23 DIAGNOSIS — Q2111 Secundum atrial septal defect: Secondary | ICD-10-CM | POA: Insufficient documentation

## 2013-01-23 DIAGNOSIS — Z853 Personal history of malignant neoplasm of breast: Secondary | ICD-10-CM | POA: Insufficient documentation

## 2013-01-23 DIAGNOSIS — I10 Essential (primary) hypertension: Secondary | ICD-10-CM | POA: Insufficient documentation

## 2013-01-23 DIAGNOSIS — R079 Chest pain, unspecified: Secondary | ICD-10-CM

## 2013-01-23 DIAGNOSIS — Z885 Allergy status to narcotic agent status: Secondary | ICD-10-CM | POA: Insufficient documentation

## 2013-01-23 DIAGNOSIS — Z8679 Personal history of other diseases of the circulatory system: Secondary | ICD-10-CM | POA: Insufficient documentation

## 2013-01-23 DIAGNOSIS — M129 Arthropathy, unspecified: Secondary | ICD-10-CM | POA: Insufficient documentation

## 2013-01-23 DIAGNOSIS — Z8669 Personal history of other diseases of the nervous system and sense organs: Secondary | ICD-10-CM | POA: Insufficient documentation

## 2013-01-23 DIAGNOSIS — I4581 Long QT syndrome: Secondary | ICD-10-CM | POA: Insufficient documentation

## 2013-01-23 DIAGNOSIS — F329 Major depressive disorder, single episode, unspecified: Secondary | ICD-10-CM | POA: Insufficient documentation

## 2013-01-23 DIAGNOSIS — Z950 Presence of cardiac pacemaker: Secondary | ICD-10-CM | POA: Insufficient documentation

## 2013-01-23 DIAGNOSIS — R0602 Shortness of breath: Secondary | ICD-10-CM | POA: Insufficient documentation

## 2013-01-23 DIAGNOSIS — R42 Dizziness and giddiness: Secondary | ICD-10-CM | POA: Insufficient documentation

## 2013-01-23 DIAGNOSIS — Q211 Atrial septal defect: Secondary | ICD-10-CM | POA: Insufficient documentation

## 2013-01-23 LAB — PRO B NATRIURETIC PEPTIDE: Pro B Natriuretic peptide (BNP): 516.3 pg/mL — ABNORMAL HIGH (ref 0–125)

## 2013-01-23 LAB — CBC
Platelets: 264 10*3/uL (ref 150–400)
RBC: 4.24 MIL/uL (ref 3.87–5.11)
WBC: 8.5 10*3/uL (ref 4.0–10.5)

## 2013-01-23 LAB — POCT I-STAT TROPONIN I: Troponin i, poc: 0 ng/mL (ref 0.00–0.08)

## 2013-01-23 LAB — BASIC METABOLIC PANEL
CO2: 26 mEq/L (ref 19–32)
Chloride: 104 mEq/L (ref 96–112)
GFR calc Af Amer: 51 mL/min — ABNORMAL LOW (ref >90)
Potassium: 3.6 mEq/L (ref 3.5–5.1)
Sodium: 140 mEq/L (ref 135–145)

## 2013-01-23 NOTE — Telephone Encounter (Signed)
Agree with above recommendations to go to the ER

## 2013-01-23 NOTE — ED Provider Notes (Signed)
CSN: [id number]     Arrival date & time 01/23/13  1517 History   First MD Initiated Contact with Patient 01/23/13 1651     Chief Complaint  Patient presents with  . Chest Pain   (Consider location/radiation/quality/duration/timing/severity/associated sxs/prior Treatment) Patient is a 63 y.o. female presenting with chest pain.  Chest Pain  Pt with history of PFO, Long QT syndrome with Pacemaker placed for VTach reports she has had intermittent chest heaviness and SOB at times since yesterday. Denies any leg swelling or recent travel. She has been taking her meds as prescribed. She reports occasionally feels like her heart is racing.   Past Medical History  Diagnosis Date  . Hypertension   . Depression/ anxiety   . Syncope   . Headache(784.0)     MIGRAINES  . Breast cancer 1986    s/p :L mastectomy  . Arthritis   . Dementia   . PFO (patent foramen ovale)   . 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 Ventricular tachycardia, polymorphic     long-short  . Long Q-T syndrome     gene testing pending  . Long Q-T syndrome Jan 2014    s/p ICD implant   Past Surgical History  Procedure Laterality Date  . Abdominal hysterectomy    . Arthoplasty    . Total knee arthroplasty      bilateral  . Breast surgery      mastectomy  . Pacemaker insertion     History reviewed. No pertinent family history. History  Substance Use Topics  . Smoking status: Never Smoker   . Smokeless tobacco: Never Used  . Alcohol Use: No   OB History   Grav Para Term Preterm Abortions TAB SAB Ect Mult Living                 Review of Systems  Cardiovascular: Positive for chest pain.   All other systems reviewed and are negative except as noted in HPI.   Allergies  Codeine  Home Medications   Current Outpatient Rx  Name  Route  Sig  Dispense  Refill  . ALPRAZolam (XANAX) 1 MG tablet   Oral   Take 1 mg by mouth 2 (two) times daily as needed. anxiety          . celecoxib (CELEBREX) 200 MG capsule   Oral   Take 200 mg by mouth daily as needed. For pain         . MELATONIN FAST MELTZ PO   Oral   Take 5 mg by mouth at bedtime as needed (sleep).         . Multiple Vitamin (MULTIVITAMIN WITH MINERALS) TABS   Oral   Take 1 tablet by mouth daily.         . propranolol ER (INDERAL LA) 160 MG SR capsule   Oral   Take 1 capsule (160 mg total) by mouth daily.   30 capsule   11   . spironolactone (ALDACTONE) 25 MG tablet   Oral   Take 25 mg by mouth daily.         . traMADol (ULTRAM) 50 MG tablet   Oral   Take 50 mg by mouth every 6 (six) hours as needed. For pain         . vitamin B-12 (CYANOCOBALAMIN) 100 MCG tablet   Oral   Take 100 mcg by mouth daily.  BP 119/58  Pulse 62  Temp(Src) 97.9 F (36.6 C) (Oral)  Resp 12  Ht 5\' 7"  (1.702 m)  Wt 205 lb (92.987 kg)  BMI 32.10 kg/m2  SpO2 99% Physical Exam  Nursing note and vitals reviewed. Constitutional: She is oriented to person, place, and time. She appears well-developed and well-nourished.  HENT:  Head: Normocephalic and atraumatic.  Eyes: EOM are normal. Pupils are equal, round, and reactive to light.  Neck: Normal range of motion. Neck supple.  Cardiovascular: Normal rate, normal heart sounds and intact distal pulses.   Pulmonary/Chest: Effort normal and breath sounds normal.  Abdominal: Bowel sounds are normal. She exhibits no distension. There is no tenderness.  Musculoskeletal: Normal range of motion. She exhibits no edema and no tenderness.  Neurological: She is alert and oriented to person, place, and time. She has normal strength. No cranial nerve deficit or sensory deficit.  Skin: Skin is warm and dry. No rash noted.  Psychiatric: She has a normal mood and affect.    ED Course  Procedures (including critical care time) Labs Review Labs Reviewed  BASIC METABOLIC PANEL - Abnormal; Notable for the following:    Glucose, Bld 113 (*)    BUN  25 (*)    Creatinine, Ser 1.26 (*)    GFR calc non Af Amer 44 (*)    GFR calc Af Amer 51 (*)    All other components within normal limits  PRO B NATRIURETIC PEPTIDE - Abnormal; Notable for the following:    Pro B Natriuretic peptide (BNP) 516.3 (*)    All other components within normal limits  CBC  POCT I-STAT TROPONIN I   Imaging Review Dg Chest 2 View  01/23/2013   CLINICAL DATA:  Chest pain, shortness of breath, dizziness, irregular heartbeat.  EXAM: CHEST  2 VIEW  COMPARISON:  03/06/2012  FINDINGS: Right-sided dual lead pacemaker remains in place with leads in the regions of the right atrium and right ventricle. The cardiomediastinal silhouette is within normal limits. The lungs remain mildly hypoinflated without evidence of focal airspace consolidation, edema, pleural effusion, or pneumothorax. Surgical clips are noted in the left axilla. No acute osseous abnormality is identified.  IMPRESSION: Stable appearance of the chest. No evidence of acute airspace disease.   Electronically Signed   By: Sebastian Ache   On: 01/23/2013 16:32    EKG Interpretation    Date/Time:  Friday January 23 2013 15:23:32 EST Ventricular Rate:  67 PR Interval:  168 QRS Duration: 112 QT Interval:  400 QTC Calculation: 422 R Axis:   -48 Text Interpretation:  Normal sinus rhythm Incomplete right bundle branch block Left anterior fascicular block Possible Anteroseptal infarct , age undetermined Marked ST abnormality, possible inferior subendocardial injury No significant change since last tracing Confirmed by Anitra Lauth  MD, WHITNEY ([id number]) on 01/23/2013 3:27:47 PM            MDM   1. Chest pain     Pt with atypical chest pain, does not have any CAD per cath done in 2012. Symptoms not likely to be ACS. Pacemaker interrogated without dysrhythmia in the last 48hrs. Pt feeling better and wants to go home. Advised to return for worsening. PCP and [facility] followup.    Eann Cleland B. Bernette Mayers, MD 01/23/13  [phone]

## 2013-01-23 NOTE — Telephone Encounter (Signed)
New message    C/o chest heaviness/flutter, sob, heart skipping beats--refer to triage

## 2013-01-23 NOTE — Telephone Encounter (Signed)
Continuation of bellow note: Pt C/O of SOB, her heart is skipping beats, pt gets dizzy when standing up. Chest heaviness is getting worse. Pt's BP 135/68  Pulse 75 beats/minute. Daughter states the heaviness and symptoms are the same as when she had a heart attack. Daughter was recommended to take pt to the ER. Daughter agreed, then hung up the phone. I tried to called pt's daughter back to find out what hospital pt was going be taken, the phone was a store phone.

## 2013-01-23 NOTE — ED Notes (Signed)
Pt reports that she started having chest heaviness yesterday. States that has also felt dizzy and some SOB with the symptoms. Denies any n/v. Reports a cardiac hx and Dr. Mayford Knife is her cardiologist.

## 2013-01-23 NOTE — ED Notes (Signed)
PT ambulated with baseline gait; VSS; A&Ox3; no signs of distress; respirations even and unlabored; skin warm and dry; no questions upon discharge.  

## 2013-01-23 NOTE — Telephone Encounter (Signed)
Pt's daughter called because pt has been having chest her chest like a ton, the pressure is getting worse. Pt is SOB

## 2013-02-12 ENCOUNTER — Ambulatory Visit: Admitting: Cardiology

## 2013-03-06 ENCOUNTER — Encounter: Payer: Self-pay | Admitting: Internal Medicine

## 2013-03-25 ENCOUNTER — Ambulatory Visit: Admitting: Cardiology

## 2013-03-26 ENCOUNTER — Encounter: Payer: Self-pay | Admitting: Internal Medicine

## 2013-04-17 ENCOUNTER — Telehealth: Payer: Self-pay | Admitting: *Deleted

## 2013-04-17 ENCOUNTER — Other Ambulatory Visit: Payer: Self-pay | Admitting: General Surgery

## 2013-04-17 MED ORDER — PROPRANOLOL HCL ER 160 MG PO CP24: 160.0000 mg | ORAL_CAPSULE | Freq: Every day | ORAL | Status: DC

## 2013-04-17 NOTE — Telephone Encounter (Signed)
Rx sent in for one month only. Pt needs OV for more refills she has no showed last two appointments

## 2013-04-17 NOTE — Telephone Encounter (Signed)
[facility] [place] requests propranolol refill. Thanks, MI

## 2013-05-12 ENCOUNTER — Encounter: Payer: Self-pay | Admitting: *Deleted

## 2013-06-05 ENCOUNTER — Encounter: Admitting: Internal Medicine

## 2013-06-15 ENCOUNTER — Other Ambulatory Visit: Payer: Self-pay | Admitting: Cardiology

## 2013-06-16 ENCOUNTER — Other Ambulatory Visit: Payer: Self-pay | Admitting: Cardiology

## 2013-06-22 ENCOUNTER — Telehealth: Payer: Self-pay | Admitting: *Deleted

## 2013-06-22 ENCOUNTER — Other Ambulatory Visit: Payer: Self-pay | Admitting: *Deleted

## 2013-06-22 MED ORDER — PROPRANOLOL HCL ER 160 MG PO CP24: 160.0000 mg | ORAL_CAPSULE | Freq: Every day | ORAL | Status: DC

## 2013-06-22 NOTE — Telephone Encounter (Signed)
Patient requests propranolol refill which was prescribed by Dr Radford Pax, but the patient keeps no-showing visits with Dr Radford Pax. Do you think Dr Caryl Comes would be willing to refill this for the patient as she should be taking this medication and I see that she has an appt with him coming up? Please advise. Thanks, MI

## 2013-06-22 NOTE — Telephone Encounter (Signed)
Yes it is ok to refill medication to get them thru until they see Dr. Caryl Comes on 6/10. Please make patient aware they will need to keep that appt and be seen in office to obtain further refills.

## 2013-06-24 ENCOUNTER — Encounter: Admitting: Internal Medicine

## 2013-07-07 ENCOUNTER — Encounter: Payer: Self-pay | Admitting: *Deleted

## 2013-07-15 ENCOUNTER — Ambulatory Visit (INDEPENDENT_AMBULATORY_CARE_PROVIDER_SITE_OTHER): Payer: Managed Care, Other (non HMO) | Admitting: Internal Medicine

## 2013-07-15 ENCOUNTER — Encounter: Payer: Self-pay | Admitting: Internal Medicine

## 2013-07-15 VITALS — BP 112/68 | HR 70 | Ht 67.5 in | Wt 198.0 lb

## 2013-07-15 DIAGNOSIS — I4729 Other ventricular tachycardia: Secondary | ICD-10-CM

## 2013-07-15 DIAGNOSIS — I4581 Long QT syndrome: Secondary | ICD-10-CM

## 2013-07-15 DIAGNOSIS — I428 Other cardiomyopathies: Secondary | ICD-10-CM

## 2013-07-15 DIAGNOSIS — I472 Ventricular tachycardia: Secondary | ICD-10-CM

## 2013-07-15 DIAGNOSIS — I5022 Chronic systolic (congestive) heart failure: Secondary | ICD-10-CM

## 2013-07-15 DIAGNOSIS — Z95810 Presence of automatic (implantable) cardiac defibrillator: Secondary | ICD-10-CM

## 2013-07-15 DIAGNOSIS — F430 Acute stress reaction: Secondary | ICD-10-CM

## 2013-07-15 DIAGNOSIS — R7989 Other specified abnormal findings of blood chemistry: Secondary | ICD-10-CM

## 2013-07-15 LAB — MDC_IDC_ENUM_SESS_TYPE_INCLINIC
Brady Statistic AP VS Percent: 13.32 %
Brady Statistic AS VP Percent: 0.03 %
Brady Statistic AS VS Percent: 86.63 %
Brady Statistic RA Percent Paced: 13.34 %
HIGH POWER IMPEDANCE MEASURED VALUE: 513 Ohm
HighPow Impedance: 19 Ohm
HighPow Impedance: 75 Ohm
Lead Channel Impedance Value: 456 Ohm
Lead Channel Pacing Threshold Amplitude: 0.5 V
Lead Channel Pacing Threshold Amplitude: 1 V
Lead Channel Pacing Threshold Pulse Width: 0.4 ms
Lead Channel Sensing Intrinsic Amplitude: 12.5 mV
Lead Channel Setting Pacing Amplitude: 2.5 V
Lead Channel Setting Pacing Pulse Width: 0.4 ms
MDC IDC MSMT BATTERY VOLTAGE: 3.17 V
MDC IDC MSMT LEADCHNL RA PACING THRESHOLD PULSEWIDTH: 0.4 ms
MDC IDC MSMT LEADCHNL RA SENSING INTR AMPL: 2.125 mV
MDC IDC MSMT LEADCHNL RV IMPEDANCE VALUE: 513 Ohm
MDC IDC SESS DTM: 20150610152753
MDC IDC SET LEADCHNL RA PACING AMPLITUDE: 2 V
MDC IDC SET LEADCHNL RV SENSING SENSITIVITY: 0.3 mV
MDC IDC SET ZONE DETECTION INTERVAL: 270 ms
MDC IDC SET ZONE DETECTION INTERVAL: 360 ms
MDC IDC SET ZONE DETECTION INTERVAL: 400 ms
MDC IDC STAT BRADY AP VP PERCENT: 0.02 %
MDC IDC STAT BRADY RV PERCENT PACED: 0.05 %
Zone Setting Detection Interval: 350 ms

## 2013-07-15 MED ORDER — SPIRONOLACTONE 25 MG PO TABS: 12.5000 mg | ORAL_TABLET | Freq: Every day | ORAL | Status: DC

## 2013-07-15 NOTE — Progress Notes (Signed)
Patient Care Team: Kandice Hams, MD as PCP - General (Internal Medicine)   HPI  Michelle Castro is a 63 y.o. female Seen following the history of atrial fibrillation modest nonischemic cardiomyopathy (their take off of the RCA from the left cusp) Will monitoring was found to have polymorphic ventricular tachycardia. QT prolongation was noted to have normal electrolytes. Sertraline was thought to be potentially contributing; polymorphic VT persisted despite its discontinuation  she underwent ICD implantation  Gene testing was apparently started but I do not see the results  Last blood work 12/14 demonstrated mild prerenal azotemia creatinine 1.26  Apparently the labs were repeated last week and were similar  She is noted brachial of psychosocial stress revolving around her grandson who lived with her but has struggled with polysubstance abuse    Past Medical History  Diagnosis Date  . Hypertension   . Depression/ anxiety   . Syncope   . Headache(784.0)     MIGRAINES  . Breast cancer 1986    s/p :L mastectomy  . Arthritis   . Dementia   . PFO (patent foramen ovale)   . 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 Ventricular tachycardia, polymorphic     long-short  . Long Q-T syndrome     gene testing pending  . Long Q-T syndrome Jan 2014    s/p ICD implant    Past Surgical History  Procedure Laterality Date  . Abdominal hysterectomy    . Arthoplasty    . Total knee arthroplasty      bilateral  . Breast surgery      mastectomy  . Pacemaker insertion      Current Outpatient Prescriptions  Medication Sig Dispense Refill  . ALPRAZolam (XANAX) 1 MG tablet Take 1 mg by mouth 2 (two) times daily as needed. anxiety      . celecoxib (CELEBREX) 200 MG capsule Take 200 mg by mouth daily as needed. For pain      . MELATONIN FAST MELTZ PO Take 5 mg by mouth at bedtime as needed (sleep).      . Multiple Vitamin (MULTIVITAMIN WITH  MINERALS) TABS Take 1 tablet by mouth daily.      . propranolol ER (INDERAL LA) 160 MG SR capsule Take 1 capsule (160 mg total) by mouth daily.  30 capsule  0  . spironolactone (ALDACTONE) 25 MG tablet Take 25 mg by mouth daily.      . traMADol (ULTRAM) 50 MG tablet Take 50 mg by mouth every 6 (six) hours as needed. For pain      . vitamin B-12 (CYANOCOBALAMIN) 100 MCG tablet Take 100 mcg by mouth as needed.        No current facility-administered medications for this visit.    Allergies  Allergen Reactions  . Codeine Nausea Only    Review of Systems negative except from HPI and PMH  Physical Exam BP 112/68  Pulse 70  Ht 5' 7.5" (1.715 m)  Wt 198 lb (89.812 kg)  BMI 30.54 kg/m2 Well developed and well nourished in no acute distress HENT normal E scleral and icterus clear Neck Supple JVP flat; carotids brisk and full Clear to ausculation  Regular rate and rhythm, no murmurs gallops or rub Soft with active bowel sounds No clubbing cyanosis  Edema Alert and oriented, grossly normal motor and sensory function Skin Warm and Dry    Assessment and  Plan  Long QT syndrome-probable  Ventricular tachycardia-nonsustained-polymorphic  ICD implantation-Medtronic   The patient's device was interrogated.  The information was reviewed. No changes were made in the programming.  '  Nonischemic cardiomyopathy  Prerenal azotemia  Psychosocial Stress  Given her prerenal Azotemia we will decrease her Aldactone from 25--12.5 mg. She is to have blood work the next 2 weeks by Dr. Delfina Redwood  We will reassess her LV function by echo; it would be appropriate to consider ACE inhibitor therapy if LV dysfunction persists.  She continues to have recurrent nonsustained polymorphic/monomorphic ventricular tachycardia.

## 2013-07-15 NOTE — Patient Instructions (Signed)
Your physician has recommended you make the following change in your medication:  1) DECREASE spironolactone to 12.5 mg daily  Your physician has requested that you have an echocardiogram. Echocardiography is a painless test that uses sound waves to create images of your heart. It provides your doctor with information about the size and shape of your heart and how well your heart's chambers and valves are working. This procedure takes approximately one hour. There are no restrictions for this procedure.  Remote monitoring is used to monitor your Pacemaker of ICD from home. This monitoring reduces the number of office visits required to check your device to one time per year. It allows [place] to keep an eye on the functioning of your device to ensure it is working properly. You are scheduled for a device check from home on 10/19/13. You may send your transmission at any time that day. If you have a wireless device, the transmission will be sent automatically. After your physician reviews your transmission, you will receive a postcard with your next transmission date.  Your physician wants you to follow-up in: 1 year with Dr. Caryl Comes.  You will receive a reminder letter in the mail two months in advance. If you don't receive a letter, please call our office to schedule the follow-up appointment.  Echocardiogram An echocardiogram, or echocardiography, uses sound waves (ultrasound) to produce an image of your heart. The echocardiogram is simple, painless, obtained within a short period of time, and offers valuable information to your health care provider. The images from an echocardiogram can provide information such as:  Evidence of coronary artery disease (CAD).  Heart size.  Heart muscle function.  Heart valve function.  Aneurysm detection.  Evidence of a past heart attack.  Fluid buildup around the heart.  Heart muscle thickening.  Assess heart valve function. LET [facility] CARE PROVIDER KNOW  ABOUT:  Any allergies you have.  All medicines you are taking, including vitamins, herbs, eye drops, creams, and over-the-counter medicines.  Previous problems you or members of your family have had with the use of anesthetics.  Any blood disorders you have.  Previous surgeries you have had.  Medical conditions you have.  Possibility of pregnancy, if this applies. BEFORE THE PROCEDURE   No special preparation is needed. Eat and drink normally. PROCEDURE   In order to produce an image of your heart, gel is applied to your chest and a wand-like tool (transducer) is moved over your chest. The gel helps transmit the sound waves from the transducer. The sound waves harmlessly bounce off your heart to allow the heart images to be captured in real-time motion. These images are then recorded.  The test normally takes less than an hour. AFTER THE PROCEDURE You may return to your normal schedule including diet, activities, and medicines, unless your health care provider tells you otherwise. Document Released: 01/20/2000 Document Revised: 11/12/2012 Document Reviewed: 09/29/2012 [facility] Patient Information 2014 [place].

## 2013-07-22 ENCOUNTER — Other Ambulatory Visit: Payer: Self-pay | Admitting: Internal Medicine

## 2013-08-12 ENCOUNTER — Other Ambulatory Visit (HOSPITAL_COMMUNITY): Payer: Managed Care, Other (non HMO)

## 2013-08-12 ENCOUNTER — Ambulatory Visit (INDEPENDENT_AMBULATORY_CARE_PROVIDER_SITE_OTHER): Payer: Managed Care, Other (non HMO) | Admitting: General Surgery

## 2013-08-19 ENCOUNTER — Ambulatory Visit (INDEPENDENT_AMBULATORY_CARE_PROVIDER_SITE_OTHER): Payer: Managed Care, Other (non HMO) | Admitting: Surgery

## 2013-08-19 ENCOUNTER — Ambulatory Visit (HOSPITAL_COMMUNITY): Payer: Managed Care, Other (non HMO) | Attending: Internal Medicine | Admitting: Radiology

## 2013-08-19 DIAGNOSIS — I079 Rheumatic tricuspid valve disease, unspecified: Secondary | ICD-10-CM | POA: Insufficient documentation

## 2013-08-19 DIAGNOSIS — I472 Ventricular tachycardia, unspecified: Secondary | ICD-10-CM | POA: Insufficient documentation

## 2013-08-19 DIAGNOSIS — Z9010 Acquired absence of unspecified breast and nipple: Secondary | ICD-10-CM | POA: Insufficient documentation

## 2013-08-19 DIAGNOSIS — I4891 Unspecified atrial fibrillation: Secondary | ICD-10-CM | POA: Insufficient documentation

## 2013-08-19 DIAGNOSIS — I4581 Long QT syndrome: Secondary | ICD-10-CM | POA: Insufficient documentation

## 2013-08-19 DIAGNOSIS — I428 Other cardiomyopathies: Secondary | ICD-10-CM | POA: Insufficient documentation

## 2013-08-19 DIAGNOSIS — I10 Essential (primary) hypertension: Secondary | ICD-10-CM | POA: Insufficient documentation

## 2013-08-19 DIAGNOSIS — I5022 Chronic systolic (congestive) heart failure: Secondary | ICD-10-CM

## 2013-08-19 DIAGNOSIS — Z95810 Presence of automatic (implantable) cardiac defibrillator: Secondary | ICD-10-CM | POA: Insufficient documentation

## 2013-08-19 DIAGNOSIS — I059 Rheumatic mitral valve disease, unspecified: Secondary | ICD-10-CM | POA: Insufficient documentation

## 2013-08-19 DIAGNOSIS — I509 Heart failure, unspecified: Secondary | ICD-10-CM | POA: Insufficient documentation

## 2013-08-19 DIAGNOSIS — C50919 Malignant neoplasm of unspecified site of unspecified female breast: Secondary | ICD-10-CM | POA: Insufficient documentation

## 2013-08-19 DIAGNOSIS — I4729 Other ventricular tachycardia: Secondary | ICD-10-CM | POA: Insufficient documentation

## 2013-08-19 NOTE — Progress Notes (Signed)
Echocardiogram performed.  

## 2013-09-12 ENCOUNTER — Encounter: Payer: Self-pay | Admitting: Cardiology

## 2013-09-16 ENCOUNTER — Other Ambulatory Visit: Payer: Self-pay | Admitting: *Deleted

## 2013-09-16 ENCOUNTER — Telehealth: Payer: Self-pay | Admitting: Internal Medicine

## 2013-09-16 DIAGNOSIS — I4729 Other ventricular tachycardia: Secondary | ICD-10-CM

## 2013-09-16 DIAGNOSIS — I472 Ventricular tachycardia: Secondary | ICD-10-CM

## 2013-09-16 MED ORDER — LISINOPRIL 2.5 MG PO TABS: 2.5000 mg | ORAL_TABLET | Freq: Every day | ORAL | Status: DC

## 2013-09-16 NOTE — Telephone Encounter (Signed)
New message     Patient calling from test results

## 2013-09-16 NOTE — Telephone Encounter (Signed)
Informed pt of further instruction. Starting Lisinopril 2.5 mg and BMET in 2 weeks.

## 2013-09-29 ENCOUNTER — Other Ambulatory Visit (INDEPENDENT_AMBULATORY_CARE_PROVIDER_SITE_OTHER): Payer: Managed Care, Other (non HMO)

## 2013-09-29 DIAGNOSIS — I472 Ventricular tachycardia: Secondary | ICD-10-CM

## 2013-09-29 DIAGNOSIS — I4729 Other ventricular tachycardia: Secondary | ICD-10-CM

## 2013-09-29 LAB — BASIC METABOLIC PANEL
BUN: 19 mg/dL (ref 6–23)
CALCIUM: 10 mg/dL (ref 8.4–10.5)
CHLORIDE: 102 meq/L (ref 96–112)
CO2: 29 meq/L (ref 19–32)
CREATININE: 1.3 mg/dL — AB (ref 0.4–1.2)
GFR: 45.7 mL/min — ABNORMAL LOW (ref >60.00)
GLUCOSE: 83 mg/dL (ref 70–99)
Potassium: 4.5 mEq/L (ref 3.5–5.1)
Sodium: 139 mEq/L (ref 135–145)

## 2013-10-19 ENCOUNTER — Telehealth: Payer: Self-pay | Admitting: Cardiology

## 2013-10-19 ENCOUNTER — Encounter: Payer: Managed Care, Other (non HMO) | Admitting: *Deleted

## 2013-10-19 NOTE — Telephone Encounter (Signed)
Spoke with pt and reminded pt of remote transmission that is due today. Pt verbalized understanding.   

## 2013-10-20 ENCOUNTER — Encounter: Payer: Self-pay | Admitting: Cardiology

## 2013-11-17 ENCOUNTER — Other Ambulatory Visit: Payer: Self-pay | Admitting: Internal Medicine

## 2013-12-15 ENCOUNTER — Encounter: Payer: Self-pay | Admitting: Cardiology

## 2014-01-14 ENCOUNTER — Encounter (HOSPITAL_COMMUNITY): Payer: Self-pay | Admitting: Internal Medicine

## 2014-03-30 ENCOUNTER — Encounter: Payer: Self-pay | Admitting: *Deleted

## 2014-08-02 ENCOUNTER — Other Ambulatory Visit: Payer: Self-pay

## 2014-08-03 ENCOUNTER — Other Ambulatory Visit: Payer: Self-pay | Admitting: Internal Medicine

## 2014-08-06 ENCOUNTER — Encounter: Payer: Self-pay | Admitting: *Deleted

## 2014-08-31 ENCOUNTER — Encounter: Payer: Self-pay | Admitting: *Deleted

## 2014-09-13 ENCOUNTER — Other Ambulatory Visit: Payer: Self-pay | Admitting: Internal Medicine

## 2014-10-19 ENCOUNTER — Other Ambulatory Visit: Payer: Self-pay | Admitting: Internal Medicine

## 2014-11-05 ENCOUNTER — Other Ambulatory Visit: Payer: Self-pay | Admitting: Internal Medicine

## 2014-11-08 ENCOUNTER — Telehealth: Payer: Self-pay | Admitting: Internal Medicine

## 2014-11-08 ENCOUNTER — Ambulatory Visit (INDEPENDENT_AMBULATORY_CARE_PROVIDER_SITE_OTHER): Payer: Medicare Other | Admitting: Internal Medicine

## 2014-11-08 ENCOUNTER — Encounter: Payer: Self-pay | Admitting: Internal Medicine

## 2014-11-08 ENCOUNTER — Encounter: Payer: Medicare Other | Admitting: *Deleted

## 2014-11-08 VITALS — BP 146/92 | HR 75 | Ht 67.0 in | Wt 214.0 lb

## 2014-11-08 DIAGNOSIS — Z4502 Encounter for adjustment and management of automatic implantable cardiac defibrillator: Secondary | ICD-10-CM

## 2014-11-08 DIAGNOSIS — I472 Ventricular tachycardia: Secondary | ICD-10-CM

## 2014-11-08 DIAGNOSIS — I5022 Chronic systolic (congestive) heart failure: Secondary | ICD-10-CM

## 2014-11-08 DIAGNOSIS — I4729 Other ventricular tachycardia: Secondary | ICD-10-CM

## 2014-11-08 LAB — BASIC METABOLIC PANEL
BUN: 19 mg/dL (ref 7–25)
CALCIUM: 10 mg/dL (ref 8.6–10.4)
CO2: 23 mmol/L (ref 20–31)
CREATININE: 1.09 mg/dL — AB (ref 0.50–0.99)
Chloride: 105 mmol/L (ref 98–110)
GLUCOSE: 82 mg/dL (ref 65–99)
Potassium: 4.1 mmol/L (ref 3.5–5.3)
SODIUM: 140 mmol/L (ref 135–146)

## 2014-11-08 LAB — MAGNESIUM: MAGNESIUM: 2.1 mg/dL (ref 1.5–2.5)

## 2014-11-08 NOTE — Telephone Encounter (Signed)
Patient states that she believes she was shocked last night. She states that she had been feeling weak all day prior to the shock. I asked patient if she was able to send in a remote transmission. Patient states that she is unable due to her telephone svc. Appt made with the device clinic for today. Patient informed of driving restrictions.

## 2014-11-08 NOTE — Patient Instructions (Addendum)
Medication Instructions:  Your physician has recommended you make the following change in your medication: 1) Increase Propranolol to 120 mg twice daily   Labwork: Today: BMET & Magnesium  Testing/Procedures: None ordered  Follow-Up: Your physician wants you to follow-up in  6 month with Chanetta Marshall and Dr. Caryl Comes in 12 months. You will receive a reminder letter in the mail two months in advance. If you don't receive a letter, please call our office to schedule the follow-up appointment.  Any Other Special Instructions Will Be Listed Below (If Applicable). Thank you for choosing [facility]!!

## 2014-11-08 NOTE — Telephone Encounter (Signed)
LMTCB//sss 

## 2014-11-08 NOTE — Telephone Encounter (Signed)
New Message    Pt calling stating that her device "went off", pt will not elaborate and rushed off the phone. Please call back and advise.

## 2014-11-08 NOTE — Progress Notes (Signed)
Patient Care Team: Seward Carol, MD as PCP - General (Internal Medicine)   HPI  Michelle Castro is a 64 y.o. female Seen following the history of atrial fibrillation modest nonischemic cardiomyopathy (their take off of the RCA from the left cusp) Will monitoring was found to have polymorphic ventricular tachycardia. QT prolongation was noted with normal electrolytes. Sertraline was thought to be potentially contributing; polymorphic VT persisted despite its discontinuation  she underwent ICD implantation  Gene testing was apparently started but it was done through her sister and I think her result was negative  Last blood work 12/14 demonstrated mild prerenal azotemia creatinine 1.26  Apparently the labs were repeated last week and were similar  She is seen today because of a ICD shock associated of polymorphic ventricular tachycardia/fibrillation which occurred while at rest. She also was, upon interrogation, noted to have an episode of self terminating polymorphic VT 2/16. There have been other shorter episodes.  She has taken no intercurrent medications apart from Zyrtec. I have reiterated to her the importance of checking all medications on the QT drugs [place] website  Notably she had run out of her Inderal. She had resumed at the morning prior to ICD shock.        Past Medical History  Diagnosis Date  . Hypertension   . Depression/ anxiety   . Syncope   . Headache(784.0)     MIGRAINES  . Breast cancer ([place]) 1986    s/p :L mastectomy  . Arthritis   . Dementia   . PFO (patent foramen ovale)   . 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 Ventricular tachycardia, polymorphic ([facility])     long-short  . Long Q-T syndrome     gene testing pending  . Long Q-T syndrome Jan 2014    s/p ICD implant    Past Surgical History  Procedure Laterality Date  . Abdominal hysterectomy    . Arthoplasty    . Total knee arthroplasty     bilateral  . Breast surgery      mastectomy  . Pacemaker insertion    . Implantable cardioverter defibrillator implant N/A 03/05/2012    Procedure: IMPLANTABLE CARDIOVERTER DEFIBRILLATOR IMPLANT;  Surgeon: Deboraha Sprang, MD;  Location: [facility] CATH LAB;  Service: Cardiovascular;  Laterality: N/A;    Current Outpatient Prescriptions  Medication Sig Dispense Refill  . lisinopril (PRINIVIL,ZESTRIL) 2.5 MG tablet TAKE 1 TABLET BY MOUTH EVERY DAY 30 tablet 11  . Multiple Vitamin (MULTIVITAMIN WITH MINERALS) TABS Take 1 tablet by mouth daily.    . propranolol ER (INDERAL LA) 160 MG SR capsule TAKE 1 CAPSULE EVERY DAY 15 capsule 0  . spironolactone (ALDACTONE) 25 MG tablet Take 0.5 tablets (12.5 mg total) by mouth daily. 15 tablet 11  . traMADol (ULTRAM) 50 MG tablet Take 50 mg by mouth every 6 (six) hours as needed. For pain     No current facility-administered medications for this visit.    Allergies  Allergen Reactions  . Codeine Nausea Only    Review of Systems negative except from HPI and PMH  Physical Exam BP 146/92 mmHg  Pulse 75  Ht 5\' 7"  (1.702 m)  Wt 214 lb (97.07 kg)  BMI 33.51 kg/m2 Well developed and well nourished in no acute distress HENT normal E scleral and icterus clear Neck Supple JVP flat; carotids brisk and full Clear to ausculation  Regular rate and rhythm, no  murmurs gallops or rub Soft with active bowel sounds No clubbing cyanosis  Edema Alert and oriented, grossly normal motor and sensory function Skin Warm and Dry    Assessment and  Plan  Long QT syndrome-probable  Ventricular tachycardia-nonsustained-polymorphic  ICD implantation-Medtronic   The patient's device was interrogated.  The information was reviewed. No changes were made in the programming.  '  Nonischemic cardiomyopathy  Prerenal azotemia  Psychosocial Stress  We will check her potassium and magnesium levels today.  In regards to her medications she had missed a few doses and it  is possible at this occurred in the context of acute beta blocker withdrawal. She is hypertensive, we will increase her Inderal from 160--120/120.  She is advised  Regarding driving not for 6 months She has 4 children who has not been assessed.  3 of them live in the area. We will work on getting electrocardiograms. Genetic testing had been done in her sister

## 2014-11-09 ENCOUNTER — Telehealth: Payer: Self-pay | Admitting: Internal Medicine

## 2014-11-09 LAB — CUP PACEART INCLINIC DEVICE CHECK
Battery Voltage: 3.09 V
Brady Statistic AS VP Percent: 0.03 %
Brady Statistic RA Percent Paced: 15.52 %
HighPow Impedance: 0 Ohm
HighPow Impedance: 532 Ohm
HighPow Impedance: 80 Ohm
Lead Channel Pacing Threshold Amplitude: 0.5 V
Lead Channel Pacing Threshold Amplitude: 0.75 V
Lead Channel Pacing Threshold Pulse Width: 0.4 ms
Lead Channel Setting Pacing Amplitude: 2 V
Lead Channel Setting Sensing Sensitivity: 0.3 mV
MDC IDC MSMT LEADCHNL RA IMPEDANCE VALUE: 456 Ohm
MDC IDC MSMT LEADCHNL RA SENSING INTR AMPL: 1.375 mV
MDC IDC MSMT LEADCHNL RV IMPEDANCE VALUE: 532 Ohm
MDC IDC MSMT LEADCHNL RV PACING THRESHOLD PULSEWIDTH: 0.4 ms
MDC IDC MSMT LEADCHNL RV SENSING INTR AMPL: 6.75 mV
MDC IDC SESS DTM: 20161003153503
MDC IDC SET LEADCHNL RV PACING AMPLITUDE: 2.5 V
MDC IDC SET LEADCHNL RV PACING PULSEWIDTH: 0.4 ms
MDC IDC SET ZONE DETECTION INTERVAL: 360 ms
MDC IDC STAT BRADY AP VP PERCENT: 0.02 %
MDC IDC STAT BRADY AP VS PERCENT: 15.5 %
MDC IDC STAT BRADY AS VS PERCENT: 84.45 %
MDC IDC STAT BRADY RV PERCENT PACED: 0.06 %
Zone Setting Detection Interval: 270 ms
Zone Setting Detection Interval: 350 ms
Zone Setting Detection Interval: 400 ms

## 2014-11-09 MED ORDER — PROPRANOLOL HCL ER 120 MG PO CP24: ORAL_CAPSULE | ORAL | Status: DC

## 2014-11-09 NOTE — Telephone Encounter (Signed)
New message      Pt was seen yesterday.  She thought Dr Caryl Comes was going to send in a presc at the pharmacy.  There is nothing at the pharmacy.  Please call

## 2014-11-09 NOTE — Telephone Encounter (Signed)
I left a message on the patient's voice mail that I am sending in her propranolol. Propranolol (Inderal LA) 120 mg one capsule by mouth twice daily sent in to [phone] w/ 6 refills.

## 2014-11-22 ENCOUNTER — Encounter: Payer: Managed Care, Other (non HMO) | Admitting: Nurse Practitioner

## 2014-12-08 ENCOUNTER — Other Ambulatory Visit: Payer: Self-pay | Admitting: Internal Medicine

## 2014-12-16 ENCOUNTER — Other Ambulatory Visit: Payer: Self-pay

## 2014-12-16 MED ORDER — SPIRONOLACTONE 25 MG PO TABS: 12.5000 mg | ORAL_TABLET | Freq: Every day | ORAL | Status: DC

## 2015-01-04 ENCOUNTER — Emergency Department (HOSPITAL_COMMUNITY): Payer: Medicare Other

## 2015-01-04 ENCOUNTER — Emergency Department (HOSPITAL_COMMUNITY)
Admission: EM | Admit: 2015-01-04 | Discharge: 2015-01-04 | Disposition: A | Discharge status: Home / Self Care | Payer: Medicare Other | Attending: Emergency Medicine | Admitting: Emergency Medicine

## 2015-01-04 ENCOUNTER — Encounter (HOSPITAL_COMMUNITY): Payer: Self-pay | Admitting: Emergency Medicine

## 2015-01-04 DIAGNOSIS — F419 Anxiety disorder, unspecified: Secondary | ICD-10-CM | POA: Diagnosis not present

## 2015-01-04 DIAGNOSIS — F329 Major depressive disorder, single episode, unspecified: Secondary | ICD-10-CM | POA: Diagnosis not present

## 2015-01-04 DIAGNOSIS — R531 Weakness: Secondary | ICD-10-CM

## 2015-01-04 DIAGNOSIS — R002 Palpitations: Secondary | ICD-10-CM | POA: Insufficient documentation

## 2015-01-04 DIAGNOSIS — M1990 Unspecified osteoarthritis, unspecified site: Secondary | ICD-10-CM | POA: Insufficient documentation

## 2015-01-04 DIAGNOSIS — Z8679 Personal history of other diseases of the circulatory system: Secondary | ICD-10-CM

## 2015-01-04 DIAGNOSIS — I429 Cardiomyopathy, unspecified: Secondary | ICD-10-CM

## 2015-01-04 DIAGNOSIS — Z79899 Other long term (current) drug therapy: Secondary | ICD-10-CM | POA: Insufficient documentation

## 2015-01-04 DIAGNOSIS — I10 Essential (primary) hypertension: Secondary | ICD-10-CM | POA: Insufficient documentation

## 2015-01-04 DIAGNOSIS — R0600 Dyspnea, unspecified: Secondary | ICD-10-CM

## 2015-01-04 DIAGNOSIS — I4581 Long QT syndrome: Secondary | ICD-10-CM | POA: Diagnosis not present

## 2015-01-04 DIAGNOSIS — R0602 Shortness of breath: Secondary | ICD-10-CM | POA: Diagnosis not present

## 2015-01-04 DIAGNOSIS — Z853 Personal history of malignant neoplasm of breast: Secondary | ICD-10-CM | POA: Diagnosis not present

## 2015-01-04 DIAGNOSIS — F0390 Unspecified dementia without behavioral disturbance: Secondary | ICD-10-CM | POA: Insufficient documentation

## 2015-01-04 HISTORY — DX: Paroxysmal atrial fibrillation: I48.0

## 2015-01-04 LAB — CBC WITH DIFFERENTIAL/PLATELET
Basophils Absolute: 0 10*3/uL (ref 0.0–0.1)
Basophils Relative: 1 %
EOS ABS: 0.2 10*3/uL (ref 0.0–0.7)
Eosinophils Relative: 3 %
HEMATOCRIT: 39.6 % (ref 36.0–46.0)
HEMOGLOBIN: 13.2 g/dL (ref 12.0–15.0)
LYMPHS ABS: 2.7 10*3/uL (ref 0.7–4.0)
LYMPHS PCT: 35 %
MCH: 29.8 pg (ref 26.0–34.0)
MCHC: 33.3 g/dL (ref 30.0–36.0)
MCV: 89.4 fL (ref 78.0–100.0)
MONOS PCT: 10 %
Monocytes Absolute: 0.7 10*3/uL (ref 0.1–1.0)
NEUTROS PCT: 53 %
Neutro Abs: 4.1 10*3/uL (ref 1.7–7.7)
Platelets: 263 10*3/uL (ref 150–400)
RBC: 4.43 MIL/uL (ref 3.87–5.11)
RDW: 13.7 % (ref 11.5–15.5)
WBC: 7.8 10*3/uL (ref 4.0–10.5)

## 2015-01-04 LAB — COMPREHENSIVE METABOLIC PANEL
ALK PHOS: 98 U/L (ref 38–126)
ALT: 34 U/L (ref 14–54)
AST: 29 U/L (ref 15–41)
Albumin: 3.9 g/dL (ref 3.5–5.0)
Anion gap: 8 (ref 5–15)
BILIRUBIN TOTAL: 0.3 mg/dL (ref 0.3–1.2)
BUN: 18 mg/dL (ref 6–20)
CALCIUM: 9.3 mg/dL (ref 8.9–10.3)
CO2: 25 mmol/L (ref 22–32)
CREATININE: 1.23 mg/dL — AB (ref 0.44–1.00)
Chloride: 108 mmol/L (ref 101–111)
GFR calc non Af Amer: 45 mL/min — ABNORMAL LOW (ref >60)
GFR, EST AFRICAN AMERICAN: 52 mL/min — AB (ref >60)
Glucose, Bld: 100 mg/dL — ABNORMAL HIGH (ref 65–99)
Potassium: 4.5 mmol/L (ref 3.5–5.1)
Sodium: 141 mmol/L (ref 135–145)
TOTAL PROTEIN: 7.6 g/dL (ref 6.5–8.1)

## 2015-01-04 LAB — PROTIME-INR
INR: 1.08 (ref 0.00–1.49)
PROTHROMBIN TIME: 14.2 s (ref 11.6–15.2)

## 2015-01-04 LAB — BRAIN NATRIURETIC PEPTIDE: B Natriuretic Peptide: 54.6 pg/mL (ref 0.0–100.0)

## 2015-01-04 LAB — TROPONIN I: Troponin I: <0.03 ng/mL (ref <0.031)

## 2015-01-04 LAB — I-STAT TROPONIN, ED: Troponin i, poc: 0.01 ng/mL (ref 0.00–0.08)

## 2015-01-04 LAB — D-DIMER, QUANTITATIVE (NOT AT ARMC): D DIMER QUANT: 0.6 ug{FEU}/mL — AB (ref 0.00–0.50)

## 2015-01-04 MED ORDER — IOHEXOL 350 MG/ML SOLN
100.0000 mL | Freq: Once | INTRAVENOUS | Status: AC | PRN
  Administered 2015-01-04: 100 mL via INTRAVENOUS

## 2015-01-04 NOTE — Discharge Instructions (Signed)
You were seen today for palpitations. Cardiology saw you and we think it is safe to return home with follow up.  Please return if you have any change or concerning symptoms.   Palpitations A palpitation is the feeling that your heartbeat is irregular or is faster than normal. It may feel like your heart is fluttering or skipping a beat. Palpitations are usually not a serious problem. However, in some cases, you may need further medical evaluation. CAUSES  Palpitations can be caused by:  Smoking.  Caffeine or other stimulants, such as diet pills or energy drinks.  Alcohol.  Stress and anxiety.  Strenuous physical activity.  Fatigue.  Certain medicines.  Heart disease, especially if you have a history of irregular heart rhythms (arrhythmias), such as atrial fibrillation, atrial flutter, or supraventricular tachycardia.  An improperly working pacemaker or defibrillator. DIAGNOSIS  To find the cause of your palpitations, your health care provider will take your medical history and perform a physical exam. Your health care provider may also have you take a test called an ambulatory electrocardiogram (ECG). An ECG records your heartbeat patterns over a 24-hour period. You may also have other tests, such as:  Transthoracic echocardiogram (TTE). During echocardiography, sound waves are used to evaluate how blood flows through your heart.  Transesophageal echocardiogram (TEE).  Cardiac monitoring. This allows your health care provider to monitor your heart rate and rhythm in real time.  Holter monitor. This is a portable device that records your heartbeat and can help diagnose heart arrhythmias. It allows your health care provider to track your heart activity for several days, if needed.  Stress tests by exercise or by giving medicine that makes the heart beat faster. TREATMENT  Treatment of palpitations depends on the cause of your symptoms and can vary greatly. Most cases of  palpitations do not require any treatment other than time, relaxation, and monitoring your symptoms. Other causes, such as atrial fibrillation, atrial flutter, or supraventricular tachycardia, usually require further treatment. HOME CARE INSTRUCTIONS   Avoid:  Caffeinated coffee, tea, soft drinks, diet pills, and energy drinks.  Chocolate.  Alcohol.  Stop smoking if you smoke.  Reduce your stress and anxiety. Things that can help you relax include:  A method of controlling things in your body, such as your heartbeats, with your mind (biofeedback).  Yoga.  Meditation.  Physical activity such as swimming, jogging, or walking.  Get plenty of rest and sleep. SEEK MEDICAL CARE IF:   You continue to have a fast or irregular heartbeat beyond 24 hours.  Your palpitations occur more often. SEEK IMMEDIATE MEDICAL CARE IF:  You have chest pain or shortness of breath.  You have a severe headache.  You feel dizzy or you faint. MAKE SURE YOU:  Understand these instructions.  Will watch your condition.  Will get help right away if you are not doing well or get worse.   This information is not intended to replace advice given to you by your health care provider. Make sure you discuss any questions you have with your health care provider.   Document Released: 01/20/2000 Document Revised: 01/27/2013 Document Reviewed: 03/23/2011 Elsevier Interactive Patient Education [employer].

## 2015-01-04 NOTE — Consult Note (Signed)
CARDIOLOGY CONSULT NOTE   Patient ID: Michelle Castro MRN: [id number] DOB/AGE: 66-Sep-1952 66 y.o.  Admit date: 01/04/2015  Primary Physician   Kandice Hams, MD Primary Cardiologist   Dr. Caryl Comes Reason for Consultation   SOB  HPI: Michelle Castro is a 64 y.o. female with a history of HTN, polymorphic NS ventricular tachycardia, PAF, long QT syndrome status post AICD placement, PFO, syncope, NICM, prerenal azotemia and depression who came to the [facility] ED for evaluation of shortness of breath and feeling weak.  She was seen by Dr. Caryl Comes 11/08/14 because of a ICD shock associated of polymorphic ventricular tachycardia/fibrillation which occurred while at rest. Upon interrogation, noted to have an episode of self terminating polymorphic VT 2/16. She was advised to avoid QT prolonging drug.   She was in usual state of health up until this morning when she woke up with a "clammy feeling" around 4 AM. She has shortness of breath intermittently that persisted at work with the wake filling at work as well. She also had intermittent dizziness/lightheadedness while trying to stand up that resolved within a few minutes. No syncope. For the past couple of week she has not is exertional shortness of breath without chest pain. This is unusual from her baseline shortness of breath. The patient denies nausea, vomiting, fever, chest pain, palpitations,  orthopnea, PND, syncope, cough, congestion, abdominal pain, hematochezia, melena, lower extremity edema. She states that she is compliant with her medication. No new medication added recently. No recent trauma or travel.   In ED, device interrogation reviewed by EP and normal. 3 episodes of NSVT, all very short, the last one 12-30-14. Normal device function otherwise. Optivol trend flat, histograms reasonable and reflect stable ventricular ectopy. Serum creatinine of 1.23. BNP 54. Troponin I negative. Chest x-ray clear. EKG shows a paced rhythm with left  anterior fascicular block and T-wave abnormality in inferior lateral lead.   Normal coronary arteries by cath 06/20/2010 with aberrant takeoff of RCA off the left coronary artery cusp. Echo 08/19/2013 with LV EF of 40-45%, grade 1 DD, mildly to moderately dilated LA.    Past Medical History  Diagnosis Date  . Hypertension   . Depression/ anxiety   . Syncope   . Headache(784.0)     MIGRAINES  . Breast cancer ([place]) 1986    s/p :L mastectomy  . Arthritis   . Dementia   . PFO (patent foramen ovale)   . 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 Ventricular tachycardia, polymorphic (HCC)     long-short  . Long Q-T syndrome     gene testing pending  . Long Q-T syndrome Jan 2014    s/p ICD implant     Past Surgical History  Procedure Laterality Date  . Abdominal hysterectomy    . Arthoplasty    . Total knee arthroplasty      bilateral  . Breast surgery      mastectomy  . Pacemaker insertion    . Implantable cardioverter defibrillator implant N/A 03/05/2012    Procedure: IMPLANTABLE CARDIOVERTER DEFIBRILLATOR IMPLANT;  Surgeon: Deboraha Sprang, MD;  Location: [facility] CATH LAB;  Service: Cardiovascular;  Laterality: N/A;    Allergies  Allergen Reactions  . Codeine Nausea Only    I have reviewed the patient's current medications   Prior to Admission medications   Medication Sig Start Date End Date Taking? Authorizing Provider  lisinopril (PRINIVIL,ZESTRIL) 2.5 MG tablet TAKE 1  TABLET BY MOUTH EVERY DAY 12/09/14  Yes Deboraha Sprang, MD  Multiple Vitamin (MULTIVITAMIN WITH MINERALS) TABS Take 1 tablet by mouth daily.   Yes Historical Provider, MD  propranolol ER (INDERAL LA) 120 MG 24 hr capsule Take one capsule (120 mg) by mouth twice daily 11/09/14  Yes Deboraha Sprang, MD  spironolactone (ALDACTONE) 25 MG tablet Take 0.5 tablets (12.5 mg total) by mouth daily. Patient taking differently: Take 25 mg by mouth daily.  12/16/14  Yes Deboraha Sprang, MD      Social History   Social History  . Marital Status: Married    Spouse Name: N/A  . Number of Children: N/A  . Years of Education: N/A   Occupational History  . Not on file.   Social History Main Topics  . Smoking status: Never Smoker   . Smokeless tobacco: Never Used  . Alcohol Use: No  . Drug Use: No  . Sexual Activity: Not on file   Other Topics Concern  . Not on file   Social History Narrative     Family History: Father: deceased prostate CA Mother: deceased 69 yrs heart problem and rheumatoid arthritis Brother 1: alive Brother2: alive Sister 1: alive Sister 2: alive   ROS:  Full 14 point review of systems complete and found to be negative unless listed above.  Physical Exam: Blood pressure 129/67, pulse 60, temperature 97.5 F (36.4 C), temperature source Oral, resp. rate 16, SpO2 100 %.  General: Well developed, well nourished, female in no acute distress Head: Eyes PERRLA, No xanthomas. Normocephalic and atraumatic, oropharynx without edema or exudate.  Lungs: Resp regular and unlabored, CTA. Heart: RRR no s3, s4, or murmurs..   Neck: No carotid bruits. No lymphadenopathy. No JVD. Abdomen: Bowel sounds present, abdomen soft and non-tender without masses or hernias noted. Msk:  No spine or cva tenderness. No weakness, no joint deformities or effusions. Extremities: No clubbing, cyanosis or edema. DP/PT/Radials 2+ and equal bilaterally. Neuro: Alert and oriented X 3. No focal deficits noted. Psych:  Good affect, responds appropriately Skin: No rashes or lesions noted.  Labs:   Lab Results  Component Value Date   WBC 7.8 01/04/2015   HGB 13.2 01/04/2015   HCT 39.6 01/04/2015   MCV 89.4 01/04/2015   PLT 263 01/04/2015    Recent Labs  01/04/15 1011  INR 1.08    Recent Labs Lab 01/04/15 1011  NA 141  K 4.5  CL 108  CO2 25  BUN 18  CREATININE 1.23*  CALCIUM 9.3  PROT 7.6  BILITOT 0.3  ALKPHOS 98  ALT 34  AST 29  GLUCOSE 100*  ALBUMIN 3.9    MAGNESIUM  Date Value Ref Range Status  11/08/2014 2.1 1.5 - 2.5 mg/dL Final    Recent Labs  01/04/15 1011  TROPONINI <0.03     Echo: 08/20/2014 LV EF: 40% -  45%  ------------------------------------------------------------------- Indications:   428.0 CHF. 425.4 Other primary Cardiomyopathies.  ------------------------------------------------------------------- History:  PMH: History of of anomalous RCA off left cusp. History of PFO. Acquired from the patient and from the patient&'s chart. Atrial fibrillation. Ventricular tachycardia. Long QT syndrome. Non-ischemic cardiomyopathy, with congestive heart failure, with an ejection fraction of 45%by echocardiography. The dysfunction is primarily systolic. The patient has a breast malignancy. Risk factors: Hypertension.  ------------------------------------------------------------------- Study Conclusions  - Left ventricle: The cavity size was normal. Systolic function was mildly to moderately reduced. The estimated ejection fraction was in the range of 40% to  45%. Diffuse hypokinesis. Doppler parameters are consistent with abnormal left ventricular relaxation (grade 1 diastolic dysfunction). - Left atrium: The atrium was mildly to moderately dilated. - Atrial septum: No defect or patent foramen ovale was identified.  ECG:   Vent. rate 60 BPM PR interval 182 ms QRS duration 120 ms QT/QTc 434/434 ms P-R-T axes 55 -66 -47  Cath 06/20/2010 RESULTS: 1. The left main coronary artery is widely patent and bifurcates into  left anterior descending artery and left circumflex artery.  1. Left anterior descending artery is widely patent throughout its  course at the apex. It gives rise to a moderate-sized first  diagonal branch which was widely patent and a moderate to large  size second diagonal branch which bifurcates in 2 daughter branches  and is widely patent.  1. The left  circumflex is widely patent throughout its course in the  AV groove. It gives rise to a very high obtuse marginal 1 branch  which is large and widely patent. It gives rise to a second obtuse  marginal branch which is moderate in size and widely patent and  terminates and a third obtuse marginal branch which is widely  patent.  1. The right coronary artery is widely patent throughout its course  and distally bifurcates into posterior descending artery and  posterolateral artery both of which are widely patent. There is an  anterior takeoff to the right coronary artery off the right  coronary artery cusp.  Radiology:  Dg Chest 2 View  01/04/2015  CLINICAL DATA:  Chest pain EXAM: CHEST  2 VIEW COMPARISON:  01/23/2013 FINDINGS: Heart size is within normal limits. Negative for heart failure. Lungs are clear. Right subclavian pacemaker unchanged in position. Surgical clips left axilla. No change from the prior study. IMPRESSION: No active cardiopulmonary disease. Electronically Signed   By: Franchot Gallo M.D.   On: 01/04/2015 10:49    ASSESSMENT AND PLAN:     1. DOE - For the past few weeks she has been having dyspnea on exertional which appears abnormal from her baseline. This morning she woke up with "clammy" feeling with intermittent shortness of breath and dizziness. - Serum creatinine of 1.23. BNP 54. Troponin I negative. Chest x-ray clear. EKG shows atrial paced rhythm with left anterior fascicular block and T-wave abnormality in inferior lateral lead which appears somewhat new. She had a right bundle branch block during last EKG 11/08/2014 which has resolved. - No signs of volume overload. BP stable and well controlled. Will check d-dimer, if negative negative she can be discharged and follow-up as outpatient with her primary cardiologist. No driving for another 4 month, she was advised to not drive for 6 months when seen last by Dr. Caryl Comes.   2. Ventricular  tachycardia-nonsustained-polymorphic/ Long QT syndrome-probable - s/p AICD implantation-[employer] - In ED, device interrogation reviewed by EP and normal. 3 episodes of NSVT, all very short, the last one 12-30-14. Normal device function otherwise. Optivol trend flat, histograms reasonable and reflect stable ventricular ectopy. No further EP evaluation.   3. Nonischemic cardiomyopathy - Normal coronary arteries by cath 06/20/2010 with aberrant takeoff of RCA off the left coronary artery cusp. - Echo 08/19/2013 with LV EF of 40-45%, grade 1 DD, mildly to moderately dilated LA.  - Continue lisinopril 2.5 mg once a day, propranolol extended release 120 mg twice a day and spironolacton 12/5mg  qd  4. Prerenal azotemia - Cr of 1.23. Seems at baseline.   5. HTN - Stable and well controlled  SignedLeanor Kail, PA 01/04/2015, 1:22 PM Pager [phone]  Co-Sign MD Patient seen and examined and history reviewed. Agree with above findings and plan. 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 Awoke with feeling clammy. No chest pain. No edema, cough, fever. Felt week. At work Ecg checked and she was referred to ED. Exam is unremarkable. CXR is clear. Ecg reviewed and compared to old Ecgs. She is atrially paced. She has chronic ST-T changes in the inferolateral leads. More prominent T wave inversion in the lateral leads than in October but really pretty similar to Ecgs in the past 2-3 years. RBBB resolved from October. Blood work normal. ICD shows no arrhythmia to correlate to symptoms. Optivol was flat. Daughter concerned that patient felt similar prior to ICD discharge for VT in October. At this point there is no arrhythmia or acute CHF. Ischemic risk is very low with normal cardiac cath in 2006 and 2012. Will check d-dimer. If normal I recommend DC home with continuing same medications. We can arrange outpatient follow up . If D dimer is  positive will check CT chest.   Jammie Clink [place], [place] 01/04/2015 2:23 PM

## 2015-01-04 NOTE — ED Notes (Signed)
Back from CT

## 2015-01-04 NOTE — ED Notes (Signed)
Patient transported to CT 

## 2015-01-04 NOTE — Progress Notes (Signed)
Device interrogation from the ER reviewed and normal. 3 episodes of NSVT, all very short, the last one 12-30-14.  Normal device function otherwise.  Optivol trend flat, histograms reasonable and reflect stable ventricular ectopy.  No EP eval needed at this time. Discussed with ER MD.    General cardiology to consult for shortness of breath and lightheadedness.  Chanetta Marshall, NP 01/04/2015 12:56 PM  Thompson Grayer MD, [facility] 01/04/2015 4:55 PM

## 2015-01-04 NOTE — ED Provider Notes (Addendum)
CSN: [id number]     Arrival date & time 01/04/15  [id number] History   First MD Initiated Contact with Patient 01/04/15 [phone]     Chief Complaint  Patient presents with  . Palpitations  . Shortness of Breath     (Consider location/radiation/quality/duration/timing/severity/associated sxs/prior Treatment) Patient is a 64 y.o. female presenting with palpitations and shortness of breath.  Palpitations Associated symptoms: shortness of breath   Associated symptoms: no chest pain and no cough   Shortness of Breath Associated symptoms: no chest pain, no cough and no rash      Patient is a 64 year old female with history of polymorphic V. tach, long QT syndrome with history of AICD placement and recent shock 6 weeks ago. Patient is followed by Dr. Caryl Comes from cardiology. She reports she woke up this morning clammy and short of breath, diaphoretic. No real chest pain, mild discomfort. She tried to get ready for work but continued to feel unwell.  She reports feeling lightheaded every time she moves.  No recent fevers, dysuria, syncope. No shortness of breath at rest. No uneven swelling of extremities. No recent long car trips.  Past Medical History  Diagnosis Date  . Hypertension   . Depression/ anxiety   . Syncope   . Headache(784.0)     MIGRAINES  . Breast cancer ([place]) 1986    s/p :L mastectomy  . Arthritis   . Dementia   . PFO (patent foramen ovale)   . 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 Ventricular tachycardia, polymorphic (HCC)     long-short  . Long Q-T syndrome     gene testing pending  . Long Q-T syndrome Jan 2014    s/p ICD implant   Past Surgical History  Procedure Laterality Date  . Abdominal hysterectomy    . Arthoplasty    . Total knee arthroplasty      bilateral  . Breast surgery      mastectomy  . Pacemaker insertion    . Implantable cardioverter defibrillator implant N/A 03/05/2012    Procedure: IMPLANTABLE CARDIOVERTER  DEFIBRILLATOR IMPLANT;  Surgeon: Deboraha Sprang, MD;  Location: [facility] CATH LAB;  Service: Cardiovascular;  Laterality: N/A;   History reviewed. No pertinent family history. Social History  Substance Use Topics  . Smoking status: Never Smoker   . Smokeless tobacco: Never Used  . Alcohol Use: No   OB History    No data available     Review of Systems  Constitutional: Positive for fatigue. Negative for activity change.  HENT: Negative for congestion.   Eyes: Negative for discharge.  Respiratory: Positive for shortness of breath. Negative for cough and chest tightness.   Cardiovascular: Positive for palpitations. Negative for chest pain.  Gastrointestinal: Negative for abdominal distention.  Genitourinary: Negative for dysuria and difficulty urinating.  Musculoskeletal: Negative for joint swelling.  Skin: Negative for rash.  Allergic/Immunologic: Negative for immunocompromised state.  Neurological: Positive for light-headedness. Negative for seizures and syncope.  Psychiatric/Behavioral: Negative for agitation.      Allergies  Codeine  Home Medications   Prior to Admission medications   Medication Sig Start Date End Date Taking? Authorizing Provider  lisinopril (PRINIVIL,ZESTRIL) 2.5 MG tablet TAKE 1 TABLET BY MOUTH EVERY DAY 12/09/14   Deboraha Sprang, MD  Multiple Vitamin (MULTIVITAMIN WITH MINERALS) TABS Take 1 tablet by mouth daily.    Historical Provider, MD  propranolol ER (INDERAL LA) 120 MG 24 hr capsule Take  one capsule (120 mg) by mouth twice daily 11/09/14   Deboraha Sprang, MD  spironolactone (ALDACTONE) 25 MG tablet Take 0.5 tablets (12.5 mg total) by mouth daily. 12/16/14   Deboraha Sprang, MD  traMADol (ULTRAM) 50 MG tablet Take 50 mg by mouth every 6 (six) hours as needed. For pain    Historical Provider, MD   BP 127/72 mmHg  Pulse 58  Temp(Src) 97.5 F (36.4 C) (Oral)  Resp 18  SpO2 98% Physical Exam  Constitutional: She is oriented to person, place, and time.  She appears well-developed and well-nourished.  HENT:  Head: Normocephalic and atraumatic.  Eyes: Conjunctivae are normal. Right eye exhibits no discharge.  Neck: Neck supple.  Cardiovascular: Normal rate, regular rhythm and normal heart sounds.   No murmur heard. Pulmonary/Chest: Effort normal and breath sounds normal. She has no wheezes. She has no rales.  Abdominal: Soft. She exhibits no distension. There is no tenderness.  Musculoskeletal: Normal range of motion. She exhibits no edema.  Neurological: She is oriented to person, place, and time. No cranial nerve deficit.  Skin: Skin is warm and dry. No rash noted. She is not diaphoretic.  Psychiatric: She has a normal mood and affect. Her behavior is normal.  Nursing note and vitals reviewed.   ED Course  Procedures (including critical care time) Labs Review Labs Reviewed  CBC WITH DIFFERENTIAL/PLATELET  COMPREHENSIVE METABOLIC PANEL  TROPONIN I  PROTIME-INR  BRAIN NATRIURETIC PEPTIDE    Imaging Review No results found. I have personally reviewed and evaluated these images and lab results as part of my medical decision-making.   EKG Interpretation   Date/Time:  Tuesday January 04 2015 09:46:13 EST Ventricular Rate:  60 PR Interval:  182 QRS Duration: 120 QT Interval:  434 QTC Calculation: 434 R Axis:   -66 Text Interpretation:  Atrial-paced rhythm Left anterior fascicular block  Cannot rule out Anteroseptal infarct , age undetermined ST \\T \ T wave  abnormality, consider lateral ischemia Abnormal ECG Minimal st elevations  in V1-V2, uncahnged to prior ST depression in V5 V6  similar to previous  No significant change since last tracing Confirmed by Gerald Leitz  [phone]) on 01/04/2015 9:53:47 AM      MDM   Final diagnoses:  None    Patient's very pleasant well-appearing 64 year old female with history of AICD for long Q-T and polymorphic V. tach. Patient has a [employer] device. Patient states she feels  unwell. She feels palpitations, lightheadedness. No chest pain. Most recent shock 6 weeks ago. Patient saw Dr. Caryl Comes after this in the beginning of October.  We'll get troponin, BNP, chest x-ray. We will contact [employer] for readout.  12:00 PM [employer] report here, no evidence of Vtach. Will have cardiology see patient to decide admission vs discharge given her ongoing lightheadedness that I am concerned about.  QTC normal here.   1:17 PM  Electrophysiology all back for cardiology and reported there is nothing to be done. However I'm concerned and given patient's age, female, ate that these symptoms of lightheadedness are a cardiac equivalent. We will do a second troponin. If 2 serial troponins are negative given the lack of chest pain, negative troponins, negative EKG will have her follow-up with Dr. Caryl Comes as an outpatient.    Reola Buckles Julio Alm, MD 01/04/15 1201  3:18 PM Cardiology left a note, saying they were ordering a d dimer. It came back positive, will get CT PE.   If PE negative- pt will return home.  Sharmon Cheramie Julio Alm, MD 01/04/15 1556

## 2015-01-04 NOTE — ED Provider Notes (Signed)
CT chest negative Pt stable for d/c home   Ripley Fraise, MD 01/04/15 1659

## 2015-01-04 NOTE — ED Notes (Signed)
Pt sts palpitations this am with SOB; pt sts hx of same and irregular HR

## 2015-02-08 ENCOUNTER — Telehealth: Payer: Self-pay | Admitting: Cardiology

## 2015-02-08 ENCOUNTER — Encounter: Payer: Medicare Other | Admitting: *Deleted

## 2015-02-08 NOTE — Telephone Encounter (Signed)
Spoke with pt and reminded pt of remote transmission that is due today. Pt verbalized understanding.   

## 2015-02-11 ENCOUNTER — Encounter: Payer: Self-pay | Admitting: Cardiology

## 2015-02-14 DIAGNOSIS — K8000 Calculus of gallbladder with acute cholecystitis without obstruction: Secondary | ICD-10-CM | POA: Diagnosis not present

## 2015-02-22 DIAGNOSIS — K8010 Calculus of gallbladder with chronic cholecystitis without obstruction: Secondary | ICD-10-CM | POA: Diagnosis not present

## 2015-02-22 DIAGNOSIS — R1011 Right upper quadrant pain: Secondary | ICD-10-CM | POA: Diagnosis not present

## 2015-02-22 DIAGNOSIS — R1013 Epigastric pain: Secondary | ICD-10-CM | POA: Diagnosis not present

## 2015-02-23 ENCOUNTER — Telehealth: Payer: Self-pay | Admitting: Internal Medicine

## 2015-02-23 NOTE — Telephone Encounter (Signed)
I have not seen her in 2 years so she needs to see extender or get Dr. Caryl Comes to give clearance since he has seen her recently

## 2015-02-23 NOTE — Telephone Encounter (Signed)
New message      Request for surgical clearance:  What type of surgery is being performed? Gallbladder removal 1. When is this surgery scheduled? Pending clearance  Are there any medications that need to be held prior to surgery and how long? Need surgical clearance 2. Name of physician performing surgery? Dr Kendell Bane  3. What is your office phone and fax number?  Fax [phone]

## 2015-02-23 NOTE — Telephone Encounter (Signed)
I will forward to primary cardiologist Dr Pincus Large RN to advise.

## 2015-02-24 ENCOUNTER — Encounter: Payer: Self-pay | Admitting: *Deleted

## 2015-02-24 NOTE — Telephone Encounter (Signed)
Per Crystal- will need general anesthesia.

## 2015-02-24 NOTE — Telephone Encounter (Signed)
I spoke with the patient and she is aware she may have surgery. Letter faxed to Dr. Amalia Hailey at [phone]. Confirmation received.

## 2015-02-24 NOTE — Telephone Encounter (Signed)
Follow up     Pt is calling to see if Dr Caryl Comes received surgical clearance and if she will need to see him in order to be cleared

## 2015-02-24 NOTE — Telephone Encounter (Signed)
I left a message for the Surgical Center to call- ? Type of anesthesia needed.

## 2015-02-24 NOTE — Telephone Encounter (Signed)
Patient should be an acceptable risk for surgery. It is important to maintain her beta-blockade. Surgery should probably done in hospital as opposed to surgical care center

## 2015-02-25 ENCOUNTER — Other Ambulatory Visit: Payer: Self-pay

## 2015-02-25 MED ORDER — LISINOPRIL 2.5 MG PO TABS
2.5000 mg | ORAL_TABLET | Freq: Every day | ORAL | Status: DC
Start: 2015-02-25 — End: 2016-09-17

## 2015-02-25 MED ORDER — SPIRONOLACTONE 25 MG PO TABS: 12.5000 mg | ORAL_TABLET | Freq: Every day | ORAL | Status: DC

## 2015-03-04 ENCOUNTER — Other Ambulatory Visit: Payer: Self-pay

## 2015-03-04 DIAGNOSIS — Z950 Presence of cardiac pacemaker: Secondary | ICD-10-CM | POA: Diagnosis not present

## 2015-03-04 DIAGNOSIS — K8010 Calculus of gallbladder with chronic cholecystitis without obstruction: Secondary | ICD-10-CM | POA: Diagnosis not present

## 2015-03-04 DIAGNOSIS — I10 Essential (primary) hypertension: Secondary | ICD-10-CM | POA: Diagnosis not present

## 2015-03-04 DIAGNOSIS — K219 Gastro-esophageal reflux disease without esophagitis: Secondary | ICD-10-CM | POA: Diagnosis not present

## 2015-03-04 DIAGNOSIS — Z79899 Other long term (current) drug therapy: Secondary | ICD-10-CM | POA: Diagnosis not present

## 2015-03-04 DIAGNOSIS — G43909 Migraine, unspecified, not intractable, without status migrainosus: Secondary | ICD-10-CM | POA: Diagnosis not present

## 2015-03-04 DIAGNOSIS — I4891 Unspecified atrial fibrillation: Secondary | ICD-10-CM | POA: Diagnosis not present

## 2015-03-04 DIAGNOSIS — M1990 Unspecified osteoarthritis, unspecified site: Secondary | ICD-10-CM | POA: Diagnosis not present

## 2015-05-02 ENCOUNTER — Other Ambulatory Visit: Payer: Self-pay | Admitting: *Deleted

## 2015-05-02 MED ORDER — PROPRANOLOL HCL ER 120 MG PO CP24: ORAL_CAPSULE | ORAL | Status: DC

## 2015-05-17 DIAGNOSIS — S93402A Sprain of unspecified ligament of left ankle, initial encounter: Secondary | ICD-10-CM | POA: Diagnosis not present

## 2015-06-22 DIAGNOSIS — F4322 Adjustment disorder with anxiety: Secondary | ICD-10-CM | POA: Diagnosis not present

## 2015-06-29 ENCOUNTER — Other Ambulatory Visit: Payer: Self-pay | Admitting: Internal Medicine

## 2015-06-30 ENCOUNTER — Other Ambulatory Visit: Payer: Self-pay

## 2015-06-30 DIAGNOSIS — Z1231 Encounter for screening mammogram for malignant neoplasm of breast: Secondary | ICD-10-CM

## 2015-07-01 ENCOUNTER — Encounter: Payer: Self-pay | Admitting: Internal Medicine

## 2015-07-04 ENCOUNTER — Other Ambulatory Visit: Payer: Self-pay | Admitting: Internal Medicine

## 2015-07-06 NOTE — Progress Notes (Addendum)
Cardiology Office Note Date:  07/07/2015  Patient ID:  Michelle Castro, Michelle Castro May 18, 1950, MRN [id number] PCP:  Christa See, MD  Cardiologist/Electrophysiologist: Dr. Caryl Comes    Chief Complaint: ICD office visit   History of Present Illness: Michelle Castro is a 65 y.o. female with history of VT/VF with long QT, HTN, breast cancer (L mastectomy), known anomalous take off of the RCA from [facility].  She comes today to be seen for Dr. Caryl Comes, last seen by him Oct 2016 that visit secondary to ICD therapies appropriate for ploymorphic VT, that visit notes incomplete family genetic testing.  Since that visit she was seen at [facility] ER for SOB, clammy feeling, her device was interrogated 3 episodes of NSVT, all very short, the last one 12-30-14. Normal device function otherwise. Optivol trend flat, histograms reasonable and reflect stable ventricular ectopy.  Ultimately the etiology of her symptom unclear and was discharged from the ER after evaluation by cardiology service.   She is feeling physically pretty well, no CP, occ feels slight palpitations, no SOB, no near syncope or syncope, no shocks from her device.  She is emotionally quite overwhelmed of late her sister quite ill in the hospital recently found with 2 separate cancers, her brother passed away less then a year ago and a grandson with a substance abuse problem all have her quite worried and anxious.        She is taking her Inderal without missing any doses.  She/her family have not yet pursued genetic testing any further.     ICD history: Appropriate shock 11/08/14 for polymorphic VT (had run out of her inderal)   Past Medical History  Diagnosis Date  . Hypertension   . Depression/ anxiety   . Syncope   . Headache(784.0)     MIGRAINES  . Breast cancer ([place]) 1986    s/p :L mastectomy  . Arthritis   . Dementia   . PFO (patent foramen ovale)   . 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  Ventricular tachycardia, polymorphic (HCC)     long-short  . Long Q-T syndrome     gene testing pending  . Long Q-T syndrome Jan 2014    s/p ICD implant  . PAF (paroxysmal atrial fibrillation) [facility])     Past Surgical History  Procedure Laterality Date  . Abdominal hysterectomy    . Arthoplasty    . Total knee arthroplasty      bilateral  . Breast surgery      mastectomy  . Pacemaker insertion    . Implantable cardioverter defibrillator implant N/A 03/05/2012    Procedure: IMPLANTABLE CARDIOVERTER DEFIBRILLATOR IMPLANT;  Surgeon: Deboraha Sprang, MD;  Location: [facility] CATH LAB;  Service: Cardiovascular;  Laterality: N/A;    Current Outpatient Prescriptions  Medication Sig Dispense Refill  . ALPRAZolam (XANAX) 0.25 MG tablet Take 0.25 mg by mouth daily as needed.    . busPIRone (BUSPAR) 15 MG tablet Take 15 mg by mouth 3 (three) times daily.    Marland Kitchen FLUoxetine (PROZAC) 20 MG capsule Take 20 mg by mouth daily.    Marland Kitchen apixaban (ELIQUIS) 5 MG TABS tablet Take 1 tablet (5 mg total) by mouth 2 (two) times daily. 60 tablet 5  . lisinopril (PRINIVIL,ZESTRIL) 2.5 MG tablet Take 1 tablet (2.5 mg total) by mouth daily. 90 tablet 2  . meloxicam (MOBIC) 7.5 MG tablet Take 7.5 mg by mouth daily.  1  .  Multiple Vitamin (MULTIVITAMIN WITH MINERALS) TABS Take 1 tablet by mouth daily.    . propranolol ER (INDERAL LA) 120 MG 24 hr capsule Take 1 capsule (120 mg total) by mouth 2 (two) times daily. 60 capsule 4  . spironolactone (ALDACTONE) 25 MG tablet Take 0.5 tablets (12.5 mg total) by mouth daily. 45 tablet 2   No current facility-administered medications for this visit.    Allergies:   Codeine   Social History:  The patient  reports that she has never smoked. She has never used smokeless tobacco. She reports that she does not drink alcohol or use illicit drugs.   Family History:   Family History  Problem Relation Age of Onset  . Cancer Father   . Hypertension Father   . Cancer Brother       ROS:  Please see the history of present illness.  All other systems are reviewed and otherwise negative.   PHYSICAL EXAM:  VS:  BP 128/76 mmHg  Pulse 65  Ht 5\' 7"  (1.702 m)  Wt 221 lb (100.245 kg)  BMI 34.61 kg/m2 BMI: Body mass index is 34.61 kg/(m^2). Well nourished, well developed, in no acute distress HEENT: normocephalic, atraumatic Neck: no JVD, carotid bruits or masses Cardiac:  normal S1, S2; RRR; no significant murmurs, no rubs, or gallops Lungs:  clear to auscultation bilaterally, no wheezing, rhonchi or rales Abd: soft, nontender MS: no deformity or atrophy Ext: no edema Skin: warm and dry, no rash Neuro:  No gross deficits appreciated Psych: euthymic mood, full affect  ICD site is stable, no tethering or discomfort    EKG:  Done today and reviewed by myself shows SR, RBBB Device interrogation today, battery status is good, 7 NSVT episdes, longest 3 seconds, AFib episode 1hr78min duration, device function is intact  08/19/13: Echocardiogram Study Conclusions - Left ventricle: The cavity size was normal. Systolic function was mildly to moderately reduced. The estimated ejection fraction was in the range of 40% to 45%. Diffuse hypokinesis. Doppler parameters are consistent with abnormal left ventricular relaxation (grade 1 diastolic dysfunction). - Left atrium: The atrium was mildly to moderately dilated. - Atrial septum: No defect or patent foramen ovale was identified.  LHC Normal coronary arteries by cath 06/20/2010 with aberrant takeoff of RCA off the left coronary artery cusp  Recent Labs: 11/08/2014: Magnesium 2.1 01/04/2015: ALT 34; B Natriuretic Peptide 54.6; BUN 18; Creatinine, Ser 1.23*; Hemoglobin 13.2; Platelets 263; Potassium 4.5; Sodium 141  No results found for requested labs within last 365 days.   CrCl cannot be calculated (Patient has no serum creatinine result on file.).   Wt Readings from Last 3 Encounters:  07/07/15 221 lb  (100.245 kg)  11/08/14 214 lb (97.07 kg)  07/15/13 198 lb (89.812 kg)     Other studies reviewed: Additional studies/records reviewed today include: summarized above  DEVICE information: MDT dual chamber ICD, implanted 03/05/12, Dr. Lovena Le  ASSESSMENT AND PLAN:  1. Long QT     Originally felt to be secondary to Sertraline, though persisted despite discontinuation     ICD with normal function     no sustained arrhythmias/shocks since 11/08/14     reminded to check meds with [employer] website     q 78mo remotes  2. HTN     Stable, follow  3. PAfib      Historically thought to have had PAF, apparently this was a presumed diagnosis based on a historical monitor with short bursts of WCT that were slightly  irregular, since then given her ultimate diagnosis of long QT and clear findings of polymorphioc VT strips were re-reviewed and felt likely to be VT and not AF, and her anticoagulation stopped      Her device check today notes an AF episode of an hour duration     CHA2DS2Vasc is at least 3, historically she was on coumadin without bleeding or complication     We will get BMET and CBC today, plan to start Eliquis 5mg  BID pending results        Disposition: Will have Dr. Olin Pia RN reach out to the patient to aid in resuming family genetic evaluation/testing, start Eliquis pending today's labs, check an echo doppler and see Dr. Caryl Comes in 46mo, sooner if needed.   Current medicines are reviewed at length with the patient today.  The patient did not have any concerns regarding medicines.  ADDEND: I reviewed case with Dr. Caryl Comes, given short duration of AF, will hold off full a/c for now.  I called and spoke with the patient as well as reported her lab results to her and that she should be hearing from Dr. Olin Pia RN in the next few days to make an appointment to come in and discuss process going forward for family genetic testing.  Haywood Lasso, PA-C 07/07/2015 1:32 PM     [address] [place] [address] [phone] (office)  [phone] (fax)

## 2015-07-07 ENCOUNTER — Ambulatory Visit (INDEPENDENT_AMBULATORY_CARE_PROVIDER_SITE_OTHER): Payer: Medicare Other | Admitting: Physician Assistant

## 2015-07-07 ENCOUNTER — Encounter: Payer: Self-pay | Admitting: Internal Medicine

## 2015-07-07 ENCOUNTER — Encounter: Payer: Self-pay | Admitting: Physician Assistant

## 2015-07-07 VITALS — BP 128/76 | HR 65 | Ht 67.0 in | Wt 221.0 lb

## 2015-07-07 DIAGNOSIS — I4581 Long QT syndrome: Secondary | ICD-10-CM | POA: Diagnosis not present

## 2015-07-07 DIAGNOSIS — I480 Paroxysmal atrial fibrillation: Secondary | ICD-10-CM | POA: Diagnosis not present

## 2015-07-07 DIAGNOSIS — I482 Chronic atrial fibrillation: Secondary | ICD-10-CM | POA: Diagnosis not present

## 2015-07-07 DIAGNOSIS — I472 Ventricular tachycardia, unspecified: Secondary | ICD-10-CM

## 2015-07-07 DIAGNOSIS — I10 Essential (primary) hypertension: Secondary | ICD-10-CM

## 2015-07-07 DIAGNOSIS — I4891 Unspecified atrial fibrillation: Secondary | ICD-10-CM | POA: Diagnosis not present

## 2015-07-07 LAB — CBC
HCT: 38.5 % (ref 35.0–45.0)
Hemoglobin: 13.1 g/dL (ref 11.7–15.5)
MCH: 30 pg (ref 27.0–33.0)
MCHC: 34 g/dL (ref 32.0–36.0)
MCV: 88.3 fL (ref 80.0–100.0)
MPV: 10 fL (ref 7.5–12.5)
PLATELETS: 352 10*3/uL (ref 140–400)
RBC: 4.36 MIL/uL (ref 3.80–5.10)
RDW: 13.8 % (ref 11.0–15.0)
WBC: 9.4 10*3/uL (ref 3.8–10.8)

## 2015-07-07 LAB — BASIC METABOLIC PANEL
BUN: 16 mg/dL (ref 7–25)
CALCIUM: 9.6 mg/dL (ref 8.6–10.4)
CO2: 24 mmol/L (ref 20–31)
CREATININE: 1.07 mg/dL — AB (ref 0.50–0.99)
Chloride: 106 mmol/L (ref 98–110)
Glucose, Bld: 93 mg/dL (ref 65–99)
Potassium: 4.9 mmol/L (ref 3.5–5.3)
Sodium: 139 mmol/L (ref 135–146)

## 2015-07-07 MED ORDER — APIXABAN 5 MG PO TABS: 5.0000 mg | ORAL_TABLET | Freq: Two times a day (BID) | ORAL | Status: DC

## 2015-07-07 NOTE — Patient Instructions (Addendum)
Medication Instructions:   STAR TAKING ELIQUIS 5 MG TWICE  DAY : DO NOT START TAKING  UNTIL YOURS LABS ARE IN AND YOU HAVE BEEN INSTRUCTED TO START  REFILLS SENT IN TO PHARMACY   If you need a refill on your cardiac medications before your next appointment, please call your pharmacy.  Labwork:  BEMT AND CBC TODAY    Testing/Procedures: Your physician has requested that you have an echocardiogram. Echocardiography is a painless test that uses sound waves to create images of your heart. It provides your doctor with information about the size and shape of your heart and how well your heart's chambers and valves are working. This procedure takes approximately one hour. There are no restrictions for this procedure.     Follow-Up:IN 3 MONTHS WITH RENEE URSUY OR DR Caryl Comes   Any Other Special Instructions Will Be Listed Below (If Applicable).

## 2015-07-19 ENCOUNTER — Other Ambulatory Visit: Payer: Self-pay

## 2015-07-19 ENCOUNTER — Ambulatory Visit
Admission: RE | Admit: 2015-07-19 | Discharge: 2015-07-19 | Disposition: A | Discharge status: Home / Self Care | Payer: Medicare Other | Source: Ambulatory Visit

## 2015-07-19 DIAGNOSIS — Z1231 Encounter for screening mammogram for malignant neoplasm of breast: Secondary | ICD-10-CM

## 2015-07-27 ENCOUNTER — Other Ambulatory Visit (HOSPITAL_COMMUNITY): Payer: Medicare Other

## 2015-07-29 ENCOUNTER — Other Ambulatory Visit (HOSPITAL_COMMUNITY): Payer: Medicare Other

## 2015-08-04 ENCOUNTER — Encounter: Payer: Self-pay | Admitting: Internal Medicine

## 2015-08-04 ENCOUNTER — Ambulatory Visit (INDEPENDENT_AMBULATORY_CARE_PROVIDER_SITE_OTHER): Payer: Medicare Other | Admitting: Internal Medicine

## 2015-08-04 VITALS — BP 114/78 | HR 65 | Ht 67.0 in | Wt 221.6 lb

## 2015-08-04 DIAGNOSIS — I429 Cardiomyopathy, unspecified: Secondary | ICD-10-CM

## 2015-08-04 DIAGNOSIS — I4581 Long QT syndrome: Secondary | ICD-10-CM | POA: Diagnosis not present

## 2015-08-04 DIAGNOSIS — I428 Other cardiomyopathies: Secondary | ICD-10-CM

## 2015-08-04 DIAGNOSIS — Z95810 Presence of automatic (implantable) cardiac defibrillator: Secondary | ICD-10-CM

## 2015-08-04 DIAGNOSIS — I480 Paroxysmal atrial fibrillation: Secondary | ICD-10-CM

## 2015-08-04 DIAGNOSIS — I472 Ventricular tachycardia: Secondary | ICD-10-CM

## 2015-08-04 DIAGNOSIS — I4729 Other ventricular tachycardia: Secondary | ICD-10-CM

## 2015-08-04 NOTE — Progress Notes (Signed)
Patient Care Team: Emmaline Kluver, MD as PCP - General   HPI  Michelle Castro is a 65 y.o. female Seen following the history of atrial fibrillation modest nonischemic cardiomyopathy (their take off of the RCA from the left cusp) Will monitoring was found to have polymorphic ventricular tachycardia. QT prolongation was noted with normal electrolytes. Sertraline was thought to be potentially contributing; polymorphic VT persisted despite its discontinuation  she underwent ICD implantation  Gene testing was apparently started but it was done through her sister and I think her result was negative  Last blood work 12/14 demonstrated mild prerenal azotemia creatinine 1.26  Apparently the labs were repeated last week and were similar  She has hx of ICD shock associated of polymorphic ventricular tachycardia/fibrillation which occurred while at rest. She also was, upon interrogation, noted to have an episode of self terminating polymorphic VT 2/16. There have been other shorter episodes.  She has taken no intercurrent medications apart from Zyrtec. I have reiterated to her the importance of checking all medications on the [employer] drugs [place] website  Notably she had run out of her Inderal. She had resumed at the morning prior to ICD shock.        Past Medical History  Diagnosis Date  . Hypertension   . Depression/ anxiety   . Syncope   . Headache(784.0)     MIGRAINES  . Breast cancer ([place]) 1986    s/p :L mastectomy  . Arthritis   . Dementia   . PFO (patent foramen ovale)   . 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 Ventricular tachycardia, polymorphic (HCC)     long-short  . Long Q-T syndrome     gene testing pending  . Long Q-T syndrome Jan 2014    s/p ICD implant  . PAF (paroxysmal atrial fibrillation) [facility])     Past Surgical History  Procedure Laterality Date  . Abdominal hysterectomy    . Arthoplasty    . Total knee  arthroplasty      bilateral  . Breast surgery      mastectomy  . Pacemaker insertion    . Implantable cardioverter defibrillator implant N/A 03/05/2012    Procedure: IMPLANTABLE CARDIOVERTER DEFIBRILLATOR IMPLANT;  Surgeon: Deboraha Sprang, MD;  Location: [facility] CATH LAB;  Service: Cardiovascular;  Laterality: N/A;    Current Outpatient Prescriptions  Medication Sig Dispense Refill  . ALPRAZolam (XANAX) 0.25 MG tablet Take 1 tablet by mouth at bedtime.    . busPIRone (BUSPAR) 15 MG tablet Take 15 mg by mouth 2 (two) times daily.    Marland Kitchen FLUoxetine (PROZAC) 20 MG capsule Take 20 mg by mouth daily.    Marland Kitchen lisinopril (PRINIVIL,ZESTRIL) 2.5 MG tablet Take 1 tablet (2.5 mg total) by mouth daily. 90 tablet 2  . meloxicam (MOBIC) 7.5 MG tablet Take 7.5 mg by mouth daily.  1  . Multiple Vitamin (MULTIVITAMIN WITH MINERALS) TABS Take 1 tablet by mouth daily.    . propranolol ER (INDERAL LA) 120 MG 24 hr capsule Take 1 capsule (120 mg total) by mouth 2 (two) times daily. 60 capsule 4  . spironolactone (ALDACTONE) 25 MG tablet Take 0.5 tablets (12.5 mg total) by mouth daily. 45 tablet 2   No current facility-administered medications for this visit.    Allergies  Allergen Reactions  . Codeine Nausea Only    Review of Systems negative except from HPI and PMH  Physical Exam BP 114/78 mmHg  Pulse 65  Ht 5\' 7"  (1.702 m)  Wt 221 lb 9.6 oz (100.517 kg)  BMI 34.70 kg/m2  SpO2 97% Well developed and well nourished in no acute distress HENT normal E scleral and icterus clear Neck Supple JVP flat; carotids brisk and full Clear to ausculation  Regular rate and rhythm, no murmurs gallops or rub Soft with active bowel sounds No clubbing cyanosis  Edema Alert and oriented, grossly normal motor and sensory function Skin Warm and Dry    Assessment and  Plan  Long QT syndrome-probable  Ventricular tachycardia-nonsustained-polymorphic  ICD implantation-Medtronic   The patient's device was  interrogated.  The information was reviewed. No changes were made in the programming.  '  Nonischemic cardiomyopathy  Prerenal azotemia  Psychosocial Stress  We will check her potassium and magnesium levels today.  In regards to her medications she had missed a few doses and it is possible at this occurred in the context of acute beta blocker withdrawal. She is hypertensive, we will increase her Inderal from 160--120/120.  I reviewed with her and her daughter and family history and family tree. Her son has had syncope. We will obtain electrocardiograms on her 4 children. Her sister is dying of cancer. She has 2 half siblings and she will asked them to get old echocardiograms.  The children are advised to to use QTdrugs.org website

## 2015-08-17 ENCOUNTER — Other Ambulatory Visit: Payer: Self-pay

## 2015-08-17 ENCOUNTER — Ambulatory Visit (HOSPITAL_COMMUNITY): Payer: Medicare Other | Attending: Cardiology

## 2015-08-17 DIAGNOSIS — I480 Paroxysmal atrial fibrillation: Secondary | ICD-10-CM | POA: Insufficient documentation

## 2015-08-17 DIAGNOSIS — I340 Nonrheumatic mitral (valve) insufficiency: Secondary | ICD-10-CM | POA: Insufficient documentation

## 2015-08-17 DIAGNOSIS — R29898 Other symptoms and signs involving the musculoskeletal system: Secondary | ICD-10-CM | POA: Diagnosis not present

## 2015-08-17 DIAGNOSIS — I4891 Unspecified atrial fibrillation: Secondary | ICD-10-CM | POA: Diagnosis present

## 2015-08-17 DIAGNOSIS — I119 Hypertensive heart disease without heart failure: Secondary | ICD-10-CM | POA: Insufficient documentation

## 2015-08-22 DIAGNOSIS — F418 Other specified anxiety disorders: Secondary | ICD-10-CM | POA: Diagnosis not present

## 2015-08-22 DIAGNOSIS — E663 Overweight: Secondary | ICD-10-CM | POA: Diagnosis not present

## 2015-08-22 DIAGNOSIS — K219 Gastro-esophageal reflux disease without esophagitis: Secondary | ICD-10-CM | POA: Diagnosis not present

## 2015-08-22 DIAGNOSIS — I5022 Chronic systolic (congestive) heart failure: Secondary | ICD-10-CM | POA: Diagnosis not present

## 2015-08-24 ENCOUNTER — Telehealth: Payer: Self-pay | Admitting: *Deleted

## 2015-08-24 NOTE — Telephone Encounter (Signed)
-----   Message from [facility] [place] sent at 08/18/2015  5:50 PM EDT ----- Please let the patient know her echo is unchanged is stable from previous a couple years ago.  No changes to her therapy.  Thanks renee

## 2015-09-27 ENCOUNTER — Encounter: Payer: Self-pay | Admitting: Internal Medicine

## 2015-10-11 ENCOUNTER — Ambulatory Visit (INDEPENDENT_AMBULATORY_CARE_PROVIDER_SITE_OTHER): Payer: Medicare Other | Admitting: Internal Medicine

## 2015-10-11 ENCOUNTER — Encounter: Payer: Self-pay | Admitting: Internal Medicine

## 2015-10-11 VITALS — BP 118/90 | HR 57 | Ht 67.0 in | Wt 221.2 lb

## 2015-10-11 DIAGNOSIS — I428 Other cardiomyopathies: Secondary | ICD-10-CM

## 2015-10-11 DIAGNOSIS — Z95810 Presence of automatic (implantable) cardiac defibrillator: Secondary | ICD-10-CM

## 2015-10-11 DIAGNOSIS — I429 Cardiomyopathy, unspecified: Secondary | ICD-10-CM

## 2015-10-11 DIAGNOSIS — I4581 Long QT syndrome: Secondary | ICD-10-CM

## 2015-10-11 DIAGNOSIS — I472 Ventricular tachycardia: Secondary | ICD-10-CM

## 2015-10-11 DIAGNOSIS — I4729 Other ventricular tachycardia: Secondary | ICD-10-CM

## 2015-10-11 LAB — BASIC METABOLIC PANEL
BUN: 18 mg/dL (ref 7–25)
CHLORIDE: 103 mmol/L (ref 98–110)
CO2: 23 mmol/L (ref 20–31)
CREATININE: 1.26 mg/dL — AB (ref 0.50–0.99)
Calcium: 9.3 mg/dL (ref 8.6–10.4)
Glucose, Bld: 97 mg/dL (ref 65–99)
Potassium: 4.7 mmol/L (ref 3.5–5.3)
Sodium: 140 mmol/L (ref 135–146)

## 2015-10-11 LAB — MAGNESIUM: Magnesium: 2 mg/dL (ref 1.5–2.5)

## 2015-10-11 MED ORDER — PROPRANOLOL HCL ER 120 MG PO CP24: ORAL_CAPSULE | ORAL | 11 refills | Status: DC

## 2015-10-11 NOTE — Progress Notes (Signed)
Patient Care Team: Emmaline Kluver, MD as PCP - General   HPI  Michelle Castro is a 65 y.o. female Seen following the history of atrial fibrillation modest nonischemic cardiomyopathy  Has hx of  polymorphic ventricular tachycardia. QT prolongation was noted with normal electrolytes. Sertraline was thought to be potentially contributing; polymorphic VT persisted despite its discontinuation  she underwent ICD implantation  Gene testing was apparently started but it was done through her sister;  result were negative  She has hx of ICD shock associated of polymorphic ventricular tachycardia/fibrillation which occurred while at rest. She had run out of her propranolol prior to this..  She also was, upon interrogation, noted to have an episode of self terminating polymorphic VT 2/16. There have been other shorter episodes.  Intercurrently she has had monomorphic VT treated with antitachycardia pacing October 05, 2022)  her sister died about a month ago. She has had frequent episodes of nonsustained ventricular tachycardia-polymorphic and associated with palpitations  7/17 Echo EF 40-45%  She has taken no intercurrent medications apart from Zyrtec. I have reiterated to her the importance of checking all medications on the [employer] drugs [place] website             Past Medical History:  Diagnosis Date  . 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 Arthritis   . Breast cancer ([place]) 1986   s/p :L mastectomy  . Dementia   . Depression/ anxiety   . Headache(784.0)    MIGRAINES  . Hypertension   . Long Q-T syndrome    gene testing pending  . Long Q-T syndrome Jan 2014   s/p ICD implant  . PAF (paroxysmal atrial fibrillation) ([place])   . PFO (patent foramen ovale)   . Syncope   . Ventricular tachycardia, polymorphic ([place])    long-short    Past Surgical History:  Procedure Laterality Date  . ABDOMINAL HYSTERECTOMY    . ARTHOPLASTY    . BREAST SURGERY      mastectomy  . IMPLANTABLE CARDIOVERTER DEFIBRILLATOR IMPLANT N/A 03/05/2012   Procedure: IMPLANTABLE CARDIOVERTER DEFIBRILLATOR IMPLANT;  Surgeon: Deboraha Sprang, MD;  Location: [facility] CATH LAB;  Service: Cardiovascular;  Laterality: N/A;  . PACEMAKER INSERTION    . TOTAL KNEE ARTHROPLASTY     bilateral    Current Outpatient Prescriptions  Medication Sig Dispense Refill  . ALPRAZolam (XANAX) 0.25 MG tablet Take 0.25 mg by mouth at bedtime.  5  . busPIRone (BUSPAR) 15 MG tablet Take 15 mg by mouth 2 (two) times daily.    Marland Kitchen lisinopril (PRINIVIL,ZESTRIL) 2.5 MG tablet Take 1 tablet (2.5 mg total) by mouth daily. 90 tablet 2  . meloxicam (MOBIC) 7.5 MG tablet Take 7.5 mg by mouth daily.  1  . Multiple Vitamin (MULTIVITAMIN WITH MINERALS) TABS Take 1 tablet by mouth daily.    . propranolol ER (INDERAL LA) 120 MG 24 hr capsule Take 1 capsule (120 mg total) by mouth 2 (two) times daily. 60 capsule 4  . spironolactone (ALDACTONE) 25 MG tablet Take 0.5 tablets (12.5 mg total) by mouth daily. 45 tablet 2   No current facility-administered medications for this visit.     Allergies  Allergen Reactions  . Codeine Nausea Only    Review of Systems negative except from HPI and PMH  Physical Exam BP 118/90   Pulse (!) 57   Ht 5\' 7"  (1.702 m)   Wt 221 lb 3.2 oz (  100.3 kg)   SpO2 98%   BMI 34.64 kg/m  Well developed and well nourished in no acute distress HENT normal E scleral and icterus clear Neck Supple JVP flat; carotids brisk and full Clear to ausculation  Regular rate and rhythm, no murmurs gallops or rub Soft with active bowel soundsweeks just toNo clubbing cyanosis  Edema Alert and oriented, grossly normal motor and sensory function Skin Warm and Dry    Assessment and  Plan  Long QT syndrome-probable  Ventricular tachycardia-nonsustained-polymorphic  ICD implantation-Medtronic   The patient's device was interrogated.  The information was reviewed. No changes were made in  the programming.  '  Nonischemic cardiomyopathy  Prerenal azotemia  Psychosocial Stress  We will check her potassium and magnesium levels today.  Her sister passing away last month was associated with a significant increase in polymorphic VT; we will increase her beta blocker again from 120 twice a day--240/120.     The children are advised to to use QTdrugs.org website

## 2015-10-11 NOTE — Patient Instructions (Addendum)
Medication Instructions: - Your physician has recommended you make the following change in your medication:  1) Increase propranolol to two capsules (240 mg) every morning and one capsule (120 mg) by mouth every evening  2) Hold lisinopril  Labwork: - Your physician recommends that you have lab work today: BMP/ Magnesium  Procedures/Testing: - none  Follow-Up: - Remote monitoring is used to monitor your Pacemaker of ICD from home. This monitoring reduces the number of office visits required to check your device to one time per year. It allows [place] to keep an eye on the functioning of your device to ensure it is working properly. You are scheduled for a device check from home on 11/08/15. You may send your transmission at any time that day. If you have a wireless device, the transmission will be sent automatically. After your physician reviews your transmission, you will receive a postcard with your next transmission date.  - Your physician wants you to follow-up in: 6 months with Dr. Caryl Comes. You will receive a reminder letter in the mail two months in advance. If you don't receive a letter, please call our office to schedule the follow-up appointment.  Any Additional Special Instructions Will Be Listed Below (If Applicable).     If you need a refill on your cardiac medications before your next appointment, please call your pharmacy.

## 2015-10-12 LAB — CUP PACEART INCLINIC DEVICE CHECK
Brady Statistic AP VP Percent: 0.04 %
Brady Statistic AP VS Percent: 21.79 %
Brady Statistic AS VP Percent: 0.03 %
Brady Statistic AS VS Percent: 78.14 %
Date Time Interrogation Session: 20170905183606
HIGH POWER IMPEDANCE MEASURED VALUE: 80 Ohm
HighPow Impedance: 532 Ohm
Implantable Lead Implant Date: 20140129
Implantable Lead Location: 753859
Implantable Lead Location: 753860
Lead Channel Impedance Value: 589 Ohm
Lead Channel Pacing Threshold Pulse Width: 0.4 ms
Lead Channel Sensing Intrinsic Amplitude: 8.125 mV
Lead Channel Setting Pacing Amplitude: 2 V
Lead Channel Setting Pacing Pulse Width: 0.4 ms
MDC IDC LEAD IMPLANT DT: 20140129
MDC IDC LEAD MODEL: 180
MDC IDC LEAD SERIAL: 310373
MDC IDC MSMT BATTERY VOLTAGE: 3.05 V
MDC IDC MSMT LEADCHNL RA IMPEDANCE VALUE: 418 Ohm
MDC IDC MSMT LEADCHNL RA PACING THRESHOLD AMPLITUDE: 0.5 V
MDC IDC MSMT LEADCHNL RA PACING THRESHOLD PULSEWIDTH: 0.4 ms
MDC IDC MSMT LEADCHNL RA SENSING INTR AMPL: 1.875 mV
MDC IDC MSMT LEADCHNL RV PACING THRESHOLD AMPLITUDE: 0.75 V
MDC IDC SET LEADCHNL RV PACING AMPLITUDE: 2.5 V
MDC IDC SET LEADCHNL RV SENSING SENSITIVITY: 0.3 mV
MDC IDC STAT BRADY RA PERCENT PACED: 21.83 %
MDC IDC STAT BRADY RV PERCENT PACED: 0.07 %

## 2015-10-14 ENCOUNTER — Telehealth: Payer: Self-pay

## 2015-10-14 NOTE — Telephone Encounter (Signed)
Per pt DPR we are authorized to leave detailed messages on mobile number. Left message on cell phone with normal lab results and no change recommendations. Informed pt on machine that if she has any questions she could call back to the office at [phone].

## 2015-10-17 ENCOUNTER — Other Ambulatory Visit: Payer: Self-pay

## 2015-10-17 MED ORDER — PROPRANOLOL HCL ER 120 MG PO CP24: ORAL_CAPSULE | ORAL | 3 refills | Status: DC

## 2015-11-08 ENCOUNTER — Encounter: Payer: Medicare Other | Admitting: *Deleted

## 2015-11-09 ENCOUNTER — Telehealth: Payer: Self-pay | Admitting: Cardiology

## 2015-11-09 NOTE — Telephone Encounter (Signed)
Spoke with pt and reminded pt of remote transmission that is due today. Pt verbalized understanding.   

## 2015-11-11 ENCOUNTER — Encounter: Payer: Self-pay | Admitting: Cardiology

## 2015-11-24 DIAGNOSIS — J029 Acute pharyngitis, unspecified: Secondary | ICD-10-CM | POA: Diagnosis not present

## 2015-11-24 DIAGNOSIS — J020 Streptococcal pharyngitis: Secondary | ICD-10-CM | POA: Diagnosis not present

## 2015-12-09 DIAGNOSIS — J301 Allergic rhinitis due to pollen: Secondary | ICD-10-CM | POA: Diagnosis not present

## 2015-12-09 DIAGNOSIS — J029 Acute pharyngitis, unspecified: Secondary | ICD-10-CM | POA: Diagnosis not present

## 2016-01-10 ENCOUNTER — Encounter: Payer: Medicare Other | Admitting: *Deleted

## 2016-01-10 ENCOUNTER — Telehealth: Payer: Self-pay | Admitting: Cardiology

## 2016-01-10 NOTE — Telephone Encounter (Signed)
LMOVM reminding pt to send remote transmission.   

## 2016-01-20 ENCOUNTER — Encounter: Payer: Self-pay | Admitting: Cardiology

## 2016-01-29 ENCOUNTER — Other Ambulatory Visit: Payer: Self-pay | Admitting: Internal Medicine

## 2016-05-07 ENCOUNTER — Encounter: Payer: Self-pay | Admitting: Cardiology

## 2016-09-17 ENCOUNTER — Ambulatory Visit (INDEPENDENT_AMBULATORY_CARE_PROVIDER_SITE_OTHER): Payer: Medicare Other | Admitting: Internal Medicine

## 2016-09-17 ENCOUNTER — Encounter: Payer: Self-pay | Admitting: Internal Medicine

## 2016-09-17 VITALS — BP 132/80 | HR 72 | Ht 67.0 in | Wt 231.0 lb

## 2016-09-17 DIAGNOSIS — I428 Other cardiomyopathies: Secondary | ICD-10-CM

## 2016-09-17 DIAGNOSIS — Z79899 Other long term (current) drug therapy: Secondary | ICD-10-CM

## 2016-09-17 DIAGNOSIS — I4581 Long QT syndrome: Secondary | ICD-10-CM | POA: Diagnosis not present

## 2016-09-17 DIAGNOSIS — I4729 Other ventricular tachycardia: Secondary | ICD-10-CM

## 2016-09-17 DIAGNOSIS — I472 Ventricular tachycardia: Secondary | ICD-10-CM | POA: Diagnosis not present

## 2016-09-17 DIAGNOSIS — Z95810 Presence of automatic (implantable) cardiac defibrillator: Secondary | ICD-10-CM

## 2016-09-17 DIAGNOSIS — I480 Paroxysmal atrial fibrillation: Secondary | ICD-10-CM | POA: Diagnosis not present

## 2016-09-17 LAB — CUP PACEART INCLINIC DEVICE CHECK
Brady Statistic AP VP Percent: 0.02 %
Brady Statistic AP VS Percent: 14.07 %
Brady Statistic AS VS Percent: 85.87 %
Date Time Interrogation Session: 20180813181050
HIGH POWER IMPEDANCE MEASURED VALUE: 551 Ohm
HighPow Impedance: 81 Ohm
Implantable Lead Implant Date: 20140129
Implantable Lead Model: 5076
Lead Channel Impedance Value: 456 Ohm
Lead Channel Pacing Threshold Amplitude: 0.75 V
Lead Channel Pacing Threshold Pulse Width: 0.4 ms
Lead Channel Setting Pacing Amplitude: 2 V
Lead Channel Setting Pacing Amplitude: 2.5 V
Lead Channel Setting Pacing Pulse Width: 0.4 ms
Lead Channel Setting Sensing Sensitivity: 0.3 mV
MDC IDC LEAD IMPLANT DT: 20140129
MDC IDC LEAD LOCATION: 753859
MDC IDC LEAD LOCATION: 753860
MDC IDC LEAD SERIAL: 310373
MDC IDC MSMT BATTERY VOLTAGE: 3.01 V
MDC IDC MSMT LEADCHNL RA PACING THRESHOLD AMPLITUDE: 0.5 V
MDC IDC MSMT LEADCHNL RA PACING THRESHOLD PULSEWIDTH: 0.4 ms
MDC IDC MSMT LEADCHNL RA SENSING INTR AMPL: 2 mV
MDC IDC MSMT LEADCHNL RV IMPEDANCE VALUE: 532 Ohm
MDC IDC MSMT LEADCHNL RV SENSING INTR AMPL: 13.875 mV
MDC IDC PG IMPLANT DT: 20140129
MDC IDC STAT BRADY AS VP PERCENT: 0.03 %
MDC IDC STAT BRADY RA PERCENT PACED: 13.87 %
MDC IDC STAT BRADY RV PERCENT PACED: 0.05 %

## 2016-09-17 NOTE — Patient Instructions (Addendum)
Medication Instructions:  Your physician recommends that you continue on your current medications as directed. Please refer to the Current Medication list given to you today.  --- If you need a refill on your cardiac medications before your next appointment, please call your pharmacy. ---  Labwork: Your physician recommends that you return for lab work for BMET & Magnesium.  The office will call you to arrange this lab work.  Testing/Procedures: None ordered  Follow-Up: Remote monitoring is used to monitor your Pacemaker of ICD from home. This monitoring reduces the number of office visits required to check your device to one time per year. It allows [place] to keep an eye on the functioning of your device to ensure it is working properly. You are scheduled for a device check from home on 12/17/2016. You may send your transmission at any time that day. If you have a wireless device, the transmission will be sent automatically. After your physician reviews your transmission, you will receive a postcard with your next transmission date.  Your physician wants you to follow-up in: 6 months with Chanetta Marshall, NP.  You will receive a reminder letter in the mail two months in advance. If you don't receive a letter, please call our office to schedule the follow-up appointment.  Thank you for choosing [facility]!!

## 2016-09-17 NOTE — Progress Notes (Signed)
Patient Care Team: Street, [name], MD as PCP - General   HPI  Michelle Castro is a 66 y.o. female Seen following the history of atrial fibrillation modest nonischemic cardiomyopathy  Has hx of  polymorphic ventricular tachycardia. QT prolongation was noted with normal electrolytes. Sertraline was thought to be potentially contributing; polymorphic VT persisted despite its discontinuation  she underwent ICD implantation  Gene testing was apparently started but it was done through her sister;  result were negative  She has hx of ICD shock associated of polymorphic ventricular tachycardia/fibrillation which occurred while at rest. She had run out of her propranolol prior to this..  She also was, upon interrogation, noted to have an episode of self terminating polymorphic VT 2/16. There have been other shorter episodes.  No interval syncope or ICD shocks  Mild stable SOB; no chest pain   Still struggling with grief  7/17 Echo EF 40-45%   Past Medical History:  Diagnosis Date  . 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 Arthritis   . Breast cancer ([place]) 1986   s/p :L mastectomy  . Dementia   . Depression/ anxiety   . Headache(784.0)    MIGRAINES  . Hypertension   . Long Q-T syndrome    gene testing pending  . Long Q-T syndrome Jan 2014   s/p ICD implant  . PAF (paroxysmal atrial fibrillation) ([place])   . PFO (patent foramen ovale)   . Syncope   . Ventricular tachycardia, polymorphic ([place])    long-short    Past Surgical History:  Procedure Laterality Date  . ABDOMINAL HYSTERECTOMY    . ARTHOPLASTY    . BREAST SURGERY     mastectomy  . IMPLANTABLE CARDIOVERTER DEFIBRILLATOR IMPLANT N/A 03/05/2012   Procedure: IMPLANTABLE CARDIOVERTER DEFIBRILLATOR IMPLANT;  Surgeon: Deboraha Sprang, MD;  Location: [facility] CATH LAB;  Service: Cardiovascular;  Laterality: N/A;  . PACEMAKER INSERTION    . TOTAL KNEE ARTHROPLASTY     bilateral     Current Outpatient Prescriptions  Medication Sig Dispense Refill  . ALPRAZolam (XANAX) 0.25 MG tablet Take 0.25 mg by mouth at bedtime.  5  . busPIRone (BUSPAR) 15 MG tablet Take 15 mg by mouth 2 (two) times daily.    . meloxicam (MOBIC) 7.5 MG tablet Take 7.5 mg by mouth daily.  1  . Multiple Vitamin (MULTIVITAMIN WITH MINERALS) TABS Take 1 tablet by mouth daily.    . propranolol ER (INDERAL LA) 120 MG 24 hr capsule Take two capsules (240 mg) in the morning and one capsule (120 mg) by mouth every evening 270 capsule 3  . spironolactone (ALDACTONE) 25 MG tablet Take 0.5 tablets (12.5 mg total) by mouth daily. 45 tablet 2   No current facility-administered medications for this visit.     Allergies  Allergen Reactions  . Codeine Nausea Only    Review of Systems negative except from HPI and PMH  Physical Exam BP 132/80   Pulse 72   Ht 5\' 7"  (1.702 m)   Wt 231 lb (104.8 kg)   SpO2 97%   BMI 36.18 kg/m  Well developed and nourished in no acute distress HENT normal Neck supple with JVP-flat Carotids brisk and full without bruits Clear Device pocket well healed; without hematoma or erythema.  There is no tethering  Regular rate and rhythm, no murmurs or gallops Abd-soft with active BS without hepatomegaly No Clubbing cyanosis edema Skin-warm and dry  A & Oriented  Grossly normal sensory and motor function   ECG sinus @ 69 17/13/41   Assessment and  Plan  Long QT syndrome-probable  Ventricular tachycardia-nonsustained-polymorphic  ICD implantation-Medtronic   The patient's device was interrogated.  The information was reviewed. No changes were made in the programming.  '  Nonischemic cardiomyopathy  Psychosocial Stress    Significant improvement in VTNS burden   For now will continue on current dose of inderal which she is tolerating We could try nadolol as an alternative or a somewhat higher dose  Will check K Mg  We spent more than 50% of our >25 min visit  in face to face counseling regarding the above   The children are advised to to use QTdrugs.org website  Her daughter is being seen in [place]

## 2016-09-20 ENCOUNTER — Telehealth: Payer: Self-pay | Admitting: *Deleted

## 2016-09-20 NOTE — Telephone Encounter (Signed)
lmtcb to schedule BMET & Magnesium level

## 2016-09-21 DIAGNOSIS — J029 Acute pharyngitis, unspecified: Secondary | ICD-10-CM | POA: Diagnosis not present

## 2016-09-21 DIAGNOSIS — J069 Acute upper respiratory infection, unspecified: Secondary | ICD-10-CM | POA: Diagnosis not present

## 2016-10-11 ENCOUNTER — Other Ambulatory Visit: Payer: Self-pay | Admitting: Internal Medicine

## 2016-10-11 NOTE — Telephone Encounter (Signed)
Pt completed lab work on 9/5

## 2016-10-14 ENCOUNTER — Other Ambulatory Visit: Payer: Self-pay | Admitting: Internal Medicine

## 2016-10-16 ENCOUNTER — Telehealth: Payer: Self-pay | Admitting: Internal Medicine

## 2016-10-16 ENCOUNTER — Other Ambulatory Visit: Payer: Self-pay | Admitting: *Deleted

## 2016-10-16 DIAGNOSIS — I209 Angina pectoris, unspecified: Secondary | ICD-10-CM

## 2016-10-16 DIAGNOSIS — F17200 Nicotine dependence, unspecified, uncomplicated: Secondary | ICD-10-CM | POA: Diagnosis not present

## 2016-10-16 DIAGNOSIS — R079 Chest pain, unspecified: Secondary | ICD-10-CM | POA: Diagnosis not present

## 2016-10-16 DIAGNOSIS — Z950 Presence of cardiac pacemaker: Secondary | ICD-10-CM | POA: Diagnosis not present

## 2016-10-16 DIAGNOSIS — R05 Cough: Secondary | ICD-10-CM | POA: Diagnosis not present

## 2016-10-16 NOTE — Telephone Encounter (Signed)
Call received from Dr. Fernanda Drum at [facility] in regards to the patient. 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 He reports the pain as "fleeting" and lasting about 30 seconds every 5-10 minutes.  Pain is left sided with some persistent SOB today.  EKG done today shows no acute changes and she had a chest x-ray that was unremarkable.   Dr. Caryl Comes spoke with Dr. Fernanda Drum and advised him that the office would be in touch with the patient to schedule either a nuclear stress test/ a cardiac CT.   Dr. Caryl Comes reviewed with Dr. Stanford Breed- decision made to scheduled a cardiac CT for the patient. Order placed and reminder sent to [facility].

## 2016-10-31 ENCOUNTER — Telehealth: Payer: Self-pay | Admitting: Internal Medicine

## 2016-10-31 NOTE — Telephone Encounter (Signed)
Lets do CT angio of heart instead  Tell her its beter

## 2016-10-31 NOTE — Telephone Encounter (Signed)
Pt left a message on my voicemail that she had an episode last week and needs to be scheduled for a stress test. I see an order for CT Morph which is not yet scheduled. Does she still need that and r does he want her to have a stress test? [phone]

## 2016-11-02 NOTE — Telephone Encounter (Signed)
Advised Dr. Caryl Comes that the order was placed for a cardiac CT, but it populates 3 orders- per Dr. Caryl Comes, this is the correct order- will need to proceed with this.

## 2016-11-06 ENCOUNTER — Other Ambulatory Visit: Payer: Self-pay | Admitting: *Deleted

## 2016-11-06 DIAGNOSIS — R0789 Other chest pain: Secondary | ICD-10-CM

## 2016-11-06 DIAGNOSIS — R0602 Shortness of breath: Secondary | ICD-10-CM

## 2016-11-30 ENCOUNTER — Encounter: Payer: Self-pay | Admitting: Internal Medicine

## 2016-12-17 ENCOUNTER — Ambulatory Visit (INDEPENDENT_AMBULATORY_CARE_PROVIDER_SITE_OTHER): Payer: Medicare Other | Admitting: *Deleted

## 2016-12-17 DIAGNOSIS — I4581 Long QT syndrome: Secondary | ICD-10-CM | POA: Diagnosis not present

## 2016-12-17 NOTE — Progress Notes (Signed)
Remote ICD transmission.   

## 2016-12-18 ENCOUNTER — Encounter: Payer: Self-pay | Admitting: Internal Medicine

## 2016-12-18 LAB — CUP PACEART REMOTE DEVICE CHECK
Brady Statistic AP VP Percent: 0.04 %
Brady Statistic AS VP Percent: 0.04 %
Brady Statistic AS VS Percent: 72.73 %
Date Time Interrogation Session: 20181112072605
HIGH POWER IMPEDANCE MEASURED VALUE: 86 Ohm
HighPow Impedance: 665 Ohm
Implantable Lead Implant Date: 20140129
Implantable Lead Location: 753860
Implantable Lead Serial Number: 310373
Lead Channel Impedance Value: 418 Ohm
Lead Channel Pacing Threshold Pulse Width: 0.4 ms
Lead Channel Sensing Intrinsic Amplitude: 10.75 mV
Lead Channel Sensing Intrinsic Amplitude: 10.75 mV
Lead Channel Setting Pacing Amplitude: 2 V
Lead Channel Setting Pacing Amplitude: 2.5 V
Lead Channel Setting Pacing Pulse Width: 0.4 ms
Lead Channel Setting Sensing Sensitivity: 0.3 mV
MDC IDC LEAD IMPLANT DT: 20140129
MDC IDC LEAD LOCATION: 753859
MDC IDC MSMT BATTERY VOLTAGE: 3.01 V
MDC IDC MSMT LEADCHNL RA PACING THRESHOLD AMPLITUDE: 0.375 V
MDC IDC MSMT LEADCHNL RA PACING THRESHOLD PULSEWIDTH: 0.4 ms
MDC IDC MSMT LEADCHNL RA SENSING INTR AMPL: 1.125 mV
MDC IDC MSMT LEADCHNL RA SENSING INTR AMPL: 1.125 mV
MDC IDC MSMT LEADCHNL RV IMPEDANCE VALUE: 532 Ohm
MDC IDC MSMT LEADCHNL RV PACING THRESHOLD AMPLITUDE: 0.625 V
MDC IDC PG IMPLANT DT: 20140129
MDC IDC STAT BRADY AP VS PERCENT: 27.19 %
MDC IDC STAT BRADY RA PERCENT PACED: 25.61 %
MDC IDC STAT BRADY RV PERCENT PACED: 0.07 %

## 2016-12-19 ENCOUNTER — Telehealth: Payer: Self-pay | Admitting: *Deleted

## 2016-12-19 DIAGNOSIS — I4729 Other ventricular tachycardia: Secondary | ICD-10-CM

## 2016-12-19 DIAGNOSIS — I472 Ventricular tachycardia: Secondary | ICD-10-CM

## 2016-12-19 NOTE — Telephone Encounter (Signed)
Please arrive at the [facility] main entrance of [facility] at 12:45 PM (30-45 minutes prior to test start time)  [facility] [address] [place] at [place], [place] [address] [phone]  Proceed to the [facility] Radiology Department (First Floor).  Please follow these instructions carefully (unless otherwise directed):   On the Night Before the Test: . Drink plenty of water. . Do not consume any caffeinated/decaffeinated beverages or chocolate 12 hours prior to your test. . Do not take any antihistamines 12 hours prior to your test.   On the Day of the Test: . Drink plenty of water. Do not drink any water within one hour of the test. . Do not eat any food 4 hours prior to the test. . You may take your regular medications prior to the test.   After the Test: . Drink plenty of water. . After receiving IV contrast, you may experience a mild flushed feeling. This is normal. . On occasion, you may experience a mild rash up to 24 hours after the test. This is not dangerous. If this occurs, you can take Benadryl 25 mg and increase your fluid intake. . If you experience trouble breathing, this can be serious. If it is severe call 911 IMMEDIATELY. If it is mild, please call our office.   I spoke with pt and gave her these instructions.  She will come in for lab work on 12/25/16

## 2016-12-20 ENCOUNTER — Other Ambulatory Visit: Payer: Medicare Other

## 2016-12-21 ENCOUNTER — Encounter: Payer: Self-pay | Admitting: Cardiology

## 2016-12-21 IMAGING — DX DG CHEST 2V
2 series · 2 of 2 positions shown · non-contrast
Comparison: 01/23/2013

CLINICAL DATA: Chest pain

EXAM:
CHEST  2 VIEW

[w chest lat]
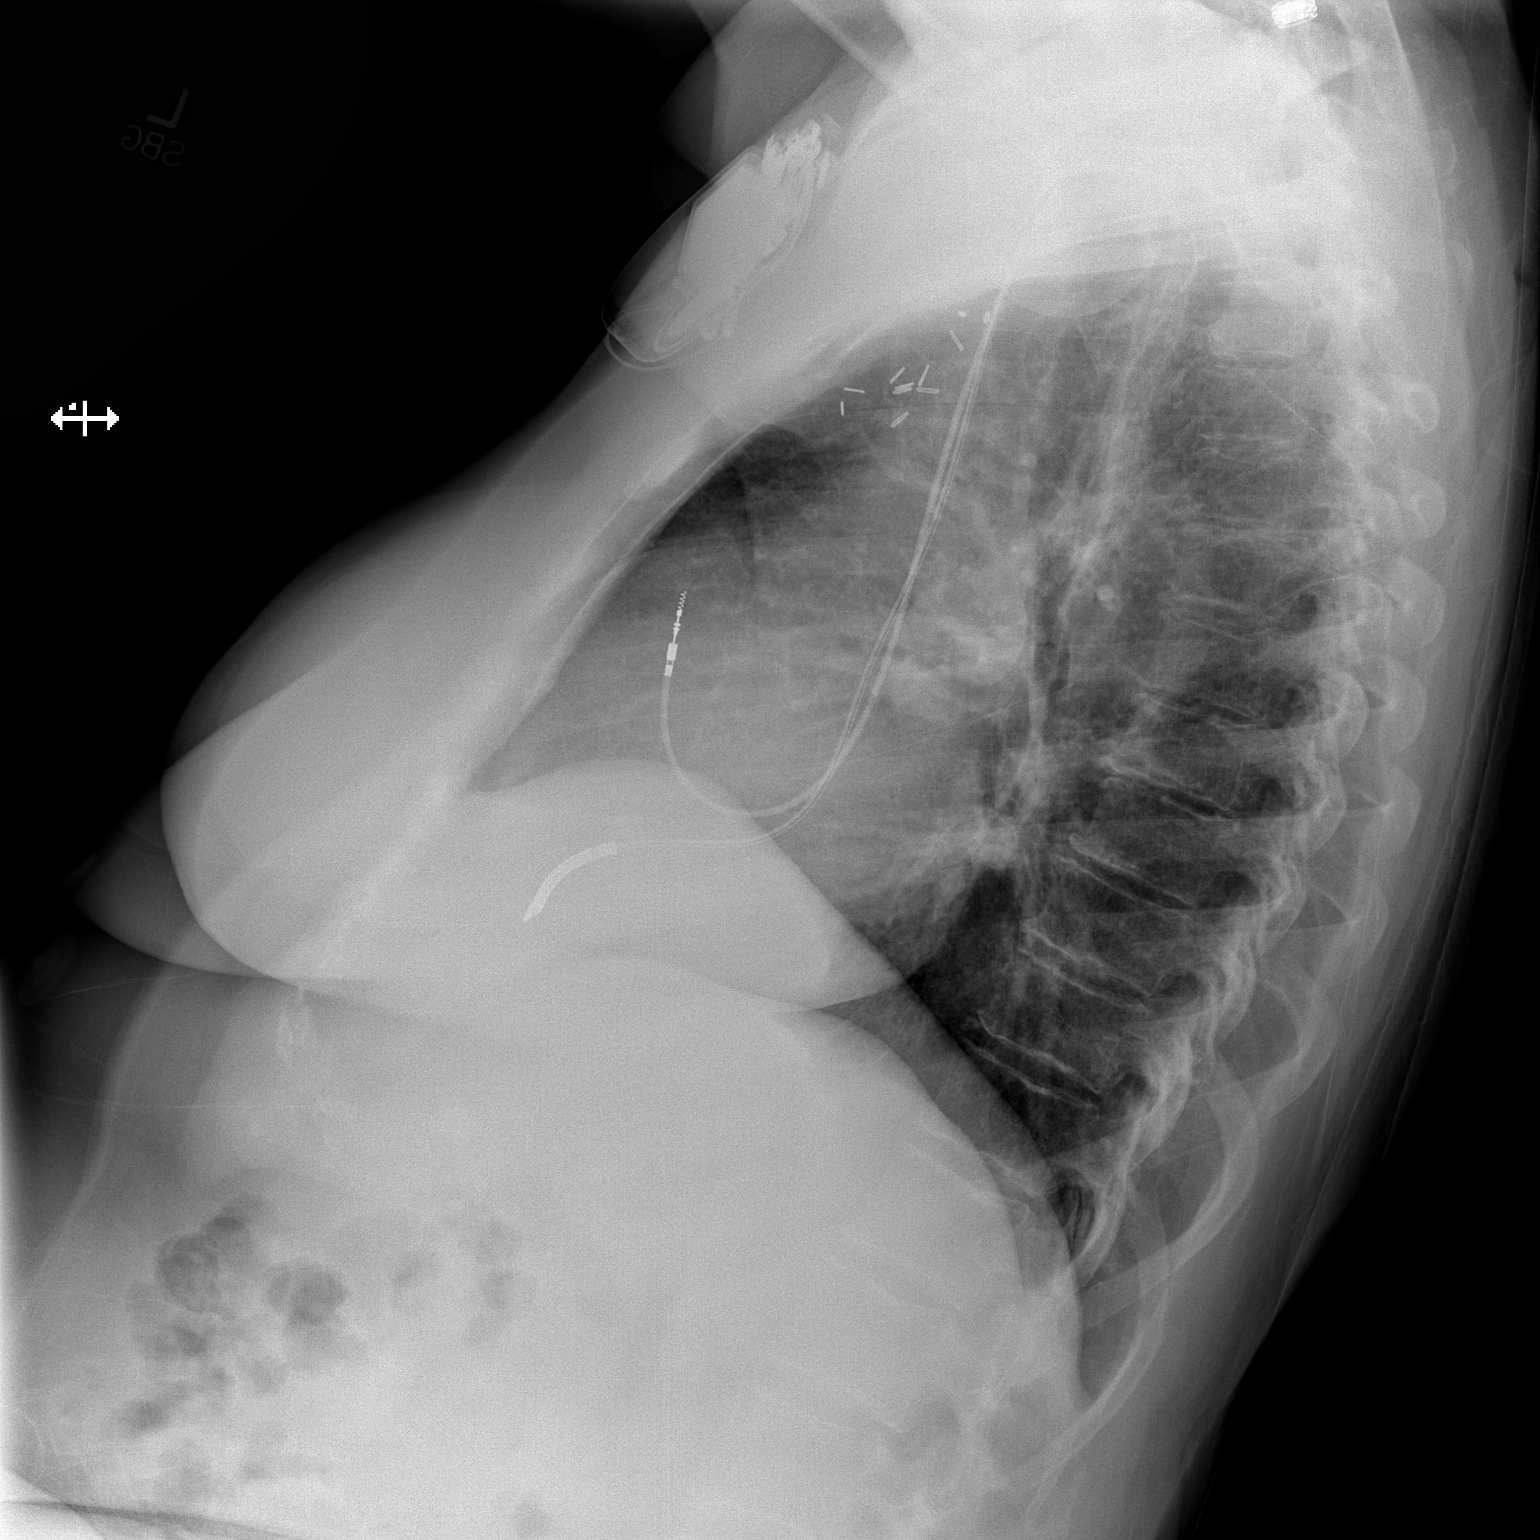

[w chest pa]
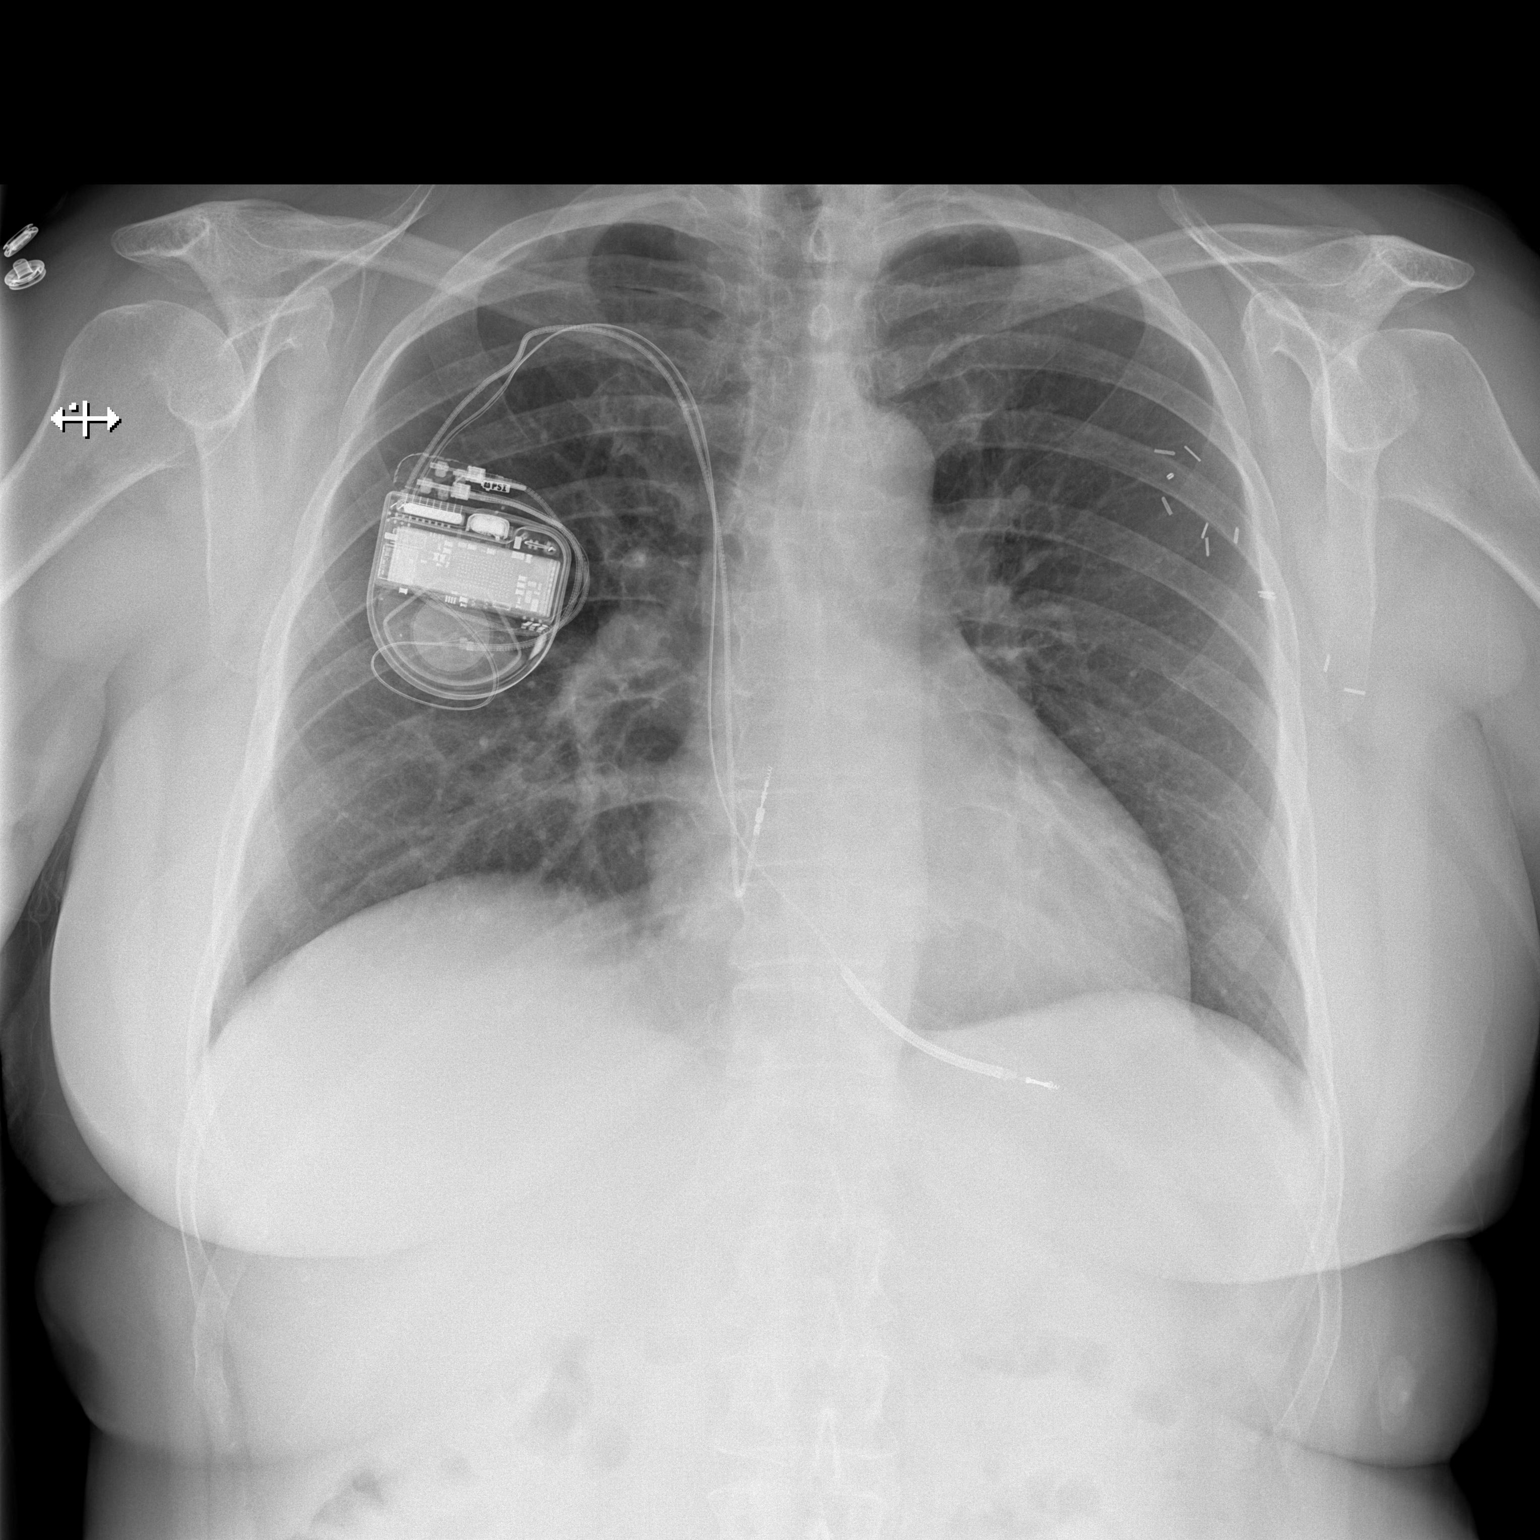

[2 of 2 positions shown; findings below may reference images not displayed]

FINDINGS: Heart size is within normal limits. Negative for heart failure.
Lungs are clear.

Right subclavian pacemaker unchanged in position. Surgical clips
left axilla. No change from the prior study.
IMPRESSION: No active cardiopulmonary disease.

## 2016-12-25 ENCOUNTER — Other Ambulatory Visit: Payer: Medicare Other

## 2016-12-31 ENCOUNTER — Other Ambulatory Visit: Payer: Medicare Other | Admitting: *Deleted

## 2016-12-31 DIAGNOSIS — I472 Ventricular tachycardia: Secondary | ICD-10-CM

## 2016-12-31 DIAGNOSIS — I4729 Other ventricular tachycardia: Secondary | ICD-10-CM

## 2017-01-01 ENCOUNTER — Ambulatory Visit (HOSPITAL_COMMUNITY)
Admission: RE | Admit: 2017-01-01 | Discharge: 2017-01-01 | Disposition: A | Discharge status: Home / Self Care | Payer: Medicare Other | Source: Ambulatory Visit | Attending: Internal Medicine | Admitting: Internal Medicine

## 2017-01-01 ENCOUNTER — Ambulatory Visit (HOSPITAL_COMMUNITY): Payer: Medicare Other

## 2017-01-01 ENCOUNTER — Other Ambulatory Visit: Payer: Self-pay | Admitting: *Deleted

## 2017-01-01 DIAGNOSIS — Q2112 Patent foramen ovale: Secondary | ICD-10-CM

## 2017-01-01 DIAGNOSIS — R0789 Other chest pain: Secondary | ICD-10-CM | POA: Insufficient documentation

## 2017-01-01 DIAGNOSIS — J9811 Atelectasis: Secondary | ICD-10-CM | POA: Diagnosis not present

## 2017-01-01 DIAGNOSIS — I480 Paroxysmal atrial fibrillation: Secondary | ICD-10-CM

## 2017-01-01 DIAGNOSIS — R0602 Shortness of breath: Secondary | ICD-10-CM

## 2017-01-01 DIAGNOSIS — I472 Ventricular tachycardia: Secondary | ICD-10-CM

## 2017-01-01 DIAGNOSIS — R079 Chest pain, unspecified: Secondary | ICD-10-CM | POA: Diagnosis not present

## 2017-01-01 DIAGNOSIS — I4729 Other ventricular tachycardia: Secondary | ICD-10-CM

## 2017-01-01 DIAGNOSIS — Q211 Atrial septal defect: Secondary | ICD-10-CM

## 2017-01-01 LAB — BASIC METABOLIC PANEL
BUN / CREAT RATIO: 14 (ref 12–28)
BUN: 18 mg/dL (ref 8–27)
CHLORIDE: 106 mmol/L (ref 96–106)
CO2: 21 mmol/L (ref 20–29)
Calcium: 9.6 mg/dL (ref 8.7–10.3)
Creatinine, Ser: 1.29 mg/dL — ABNORMAL HIGH (ref 0.57–1.00)
GFR calc Af Amer: 49 mL/min/{1.73_m2} — ABNORMAL LOW (ref >59)
GFR calc non Af Amer: 43 mL/min/{1.73_m2} — ABNORMAL LOW (ref >59)
GLUCOSE: 127 mg/dL — AB (ref 65–99)
Potassium: 5.1 mmol/L (ref 3.5–5.2)
SODIUM: 143 mmol/L (ref 134–144)

## 2017-01-01 LAB — MAGNESIUM: MAGNESIUM: 2 mg/dL (ref 1.6–2.3)

## 2017-01-01 MED ORDER — NITROGLYCERIN 0.4 MG SL SUBL
SUBLINGUAL_TABLET | SUBLINGUAL | Status: AC
  Filled 2017-01-01: qty 1

## 2017-01-01 MED ORDER — METOPROLOL TARTRATE 5 MG/5ML IV SOLN
INTRAVENOUS | Status: AC
  Filled 2017-01-01: qty 15

## 2017-01-01 MED ORDER — IOPAMIDOL (ISOVUE-370) INJECTION 76%
INTRAVENOUS | Status: AC
  Filled 2017-01-01: qty 100

## 2017-01-01 MED ORDER — NITROGLYCERIN 0.4 MG SL SUBL
0.4000 mg | SUBLINGUAL_TABLET | Freq: Once | SUBLINGUAL | Status: AC
  Administered 2017-01-01: 0.4 mg via SUBLINGUAL
  Filled 2017-01-01: qty 25

## 2017-01-01 MED ORDER — METOPROLOL TARTRATE 5 MG/5ML IV SOLN
5.0000 mg | INTRAVENOUS | Status: DC | PRN
  Administered 2017-01-01: 5 mg via INTRAVENOUS
  Filled 2017-01-01: qty 5

## 2017-01-07 ENCOUNTER — Telehealth: Payer: Self-pay | Admitting: Internal Medicine

## 2017-01-07 NOTE — Telephone Encounter (Signed)
New Message  Pt call requesting to speak RN about CT results. Please call back to discuss

## 2017-01-07 NOTE — Telephone Encounter (Signed)
Informed patient CT scan has not been reviewed by Dr. Caryl Comes, once it's reviewed by Dr. Caryl Comes she would receive a call regarding results. Patient verbalized understanding and thanked me for the call.

## 2017-01-08 ENCOUNTER — Telehealth: Payer: Self-pay

## 2017-01-08 NOTE — Telephone Encounter (Signed)
   Pt it aware and agreeable to normal results Although CT was normal pt states she still been more increasingly short of breath and having palpitations. I told her that if she wanted to she could send our [facility] a transmission when she had time later today and we could review for her to see if her symptoms are correlated to anything. She is agreeable and say she will try either tonight or tomorrow to send the transmission.

## 2017-01-10 DIAGNOSIS — F418 Other specified anxiety disorders: Secondary | ICD-10-CM | POA: Diagnosis not present

## 2017-01-10 DIAGNOSIS — J329 Chronic sinusitis, unspecified: Secondary | ICD-10-CM | POA: Diagnosis not present

## 2017-01-10 DIAGNOSIS — J40 Bronchitis, not specified as acute or chronic: Secondary | ICD-10-CM | POA: Diagnosis not present

## 2017-01-10 DIAGNOSIS — Z6836 Body mass index (BMI) 36.0-36.9, adult: Secondary | ICD-10-CM | POA: Diagnosis not present

## 2017-01-16 ENCOUNTER — Other Ambulatory Visit: Payer: Self-pay | Admitting: Family Medicine

## 2017-01-16 DIAGNOSIS — Z1231 Encounter for screening mammogram for malignant neoplasm of breast: Secondary | ICD-10-CM

## 2017-01-21 ENCOUNTER — Telehealth: Payer: Self-pay | Admitting: Internal Medicine

## 2017-01-21 NOTE — Telephone Encounter (Signed)
Patient returning call about device transmission from [place] or Cecille Rubin   Please call

## 2017-01-21 NOTE — Telephone Encounter (Signed)
Did this patient have a recent transmission come through? See 01/08/17 phone note from [place], [place].

## 2017-01-22 ENCOUNTER — Telehealth: Payer: Self-pay

## 2017-01-22 ENCOUNTER — Other Ambulatory Visit: Payer: Self-pay

## 2017-01-22 DIAGNOSIS — I4729 Other ventricular tachycardia: Secondary | ICD-10-CM

## 2017-01-22 DIAGNOSIS — E875 Hyperkalemia: Secondary | ICD-10-CM

## 2017-01-22 DIAGNOSIS — I472 Ventricular tachycardia: Secondary | ICD-10-CM

## 2017-01-22 DIAGNOSIS — I4581 Long QT syndrome: Secondary | ICD-10-CM

## 2017-01-22 NOTE — Telephone Encounter (Signed)
Pt is aware and agreeable to normal results with elevated potassium. She is agreeable to recheck on 02/18/17. I will place order and schedule lab appt.

## 2017-01-23 NOTE — Telephone Encounter (Signed)
Phone note from 01/08/17 from Fernande Bras, [place]:   Pt it aware and agreeable to normal results Although CT was normal pt states she still been more increasingly short of breath and having palpitations. I told her that if she wanted to she could send our [facility] a transmission when she had time later today and we could review for her to see if her symptoms are correlated to anything. She is agreeable and say she will try either tonight or tomorrow to send the transmission    Transmission received on 01/10/17 shows no alerts. Presenting rhythm is SR at 75bpm. Testing trends and histograms stable. 1 monitored NSVT episode--NS polymorphic VT, duration 3sec, occurred on 12/25/16.  Thoracic impedance trending down slightly (OptiVol trending up).  PVC runs trending up from 16.3/hr at last check to 32.3/hr, PVC singles increased from 408.3/hr to 778.8/hr.  Will route to [place], [place] and Dr. Caryl Comes for recommendations.

## 2017-01-23 NOTE — Telephone Encounter (Signed)
Significant increase in PVC count 2 questions first 1)  Any change in meds 2) can she measure her BP at home  Is she taking inderal  Thanks

## 2017-01-24 NOTE — Telephone Encounter (Signed)
Left message for patient to call back to go over questions from Dr. Caryl Comes.

## 2017-01-25 NOTE — Telephone Encounter (Signed)
I spoke with pt.  She is taking Inderal. She states her daughter can check her blood pressure. No new medications prior to sending transmission. She reports since sending transmission she has been diagnosed with a bacterial lung infection. Treated with antibiotics and inhaler.  Shortness of breath has greatly improved since treatment with antibiotic/inhaler.  Still needs the inhaler at times.

## 2017-01-25 NOTE — Telephone Encounter (Signed)
Follow up     Patient calling back to go over medication for PVC's

## 2017-01-31 NOTE — Telephone Encounter (Signed)
Then let us have her send another transmission for PVCs in about 4 weeks plz

## 2017-02-01 NOTE — Telephone Encounter (Signed)
LMOVM for pt to return call. Left my direct number.

## 2017-02-12 NOTE — Telephone Encounter (Signed)
Spoke w/ pt and informed her that MD wanted her to send a remote transmission in 4 weeks. Pt agreed to 02-28-2017.

## 2017-02-14 ENCOUNTER — Ambulatory Visit: Payer: Medicare Other

## 2017-02-18 ENCOUNTER — Other Ambulatory Visit: Payer: Medicare Other

## 2017-02-19 ENCOUNTER — Other Ambulatory Visit: Payer: Medicare Other | Admitting: *Deleted

## 2017-02-19 DIAGNOSIS — I4729 Other ventricular tachycardia: Secondary | ICD-10-CM

## 2017-02-19 DIAGNOSIS — I4581 Long QT syndrome: Secondary | ICD-10-CM

## 2017-02-19 DIAGNOSIS — E875 Hyperkalemia: Secondary | ICD-10-CM

## 2017-02-19 DIAGNOSIS — I472 Ventricular tachycardia: Secondary | ICD-10-CM | POA: Diagnosis not present

## 2017-02-20 LAB — BASIC METABOLIC PANEL
BUN/Creatinine Ratio: 13 (ref 12–28)
BUN: 15 mg/dL (ref 8–27)
CALCIUM: 9.6 mg/dL (ref 8.7–10.3)
CHLORIDE: 105 mmol/L (ref 96–106)
CO2: 21 mmol/L (ref 20–29)
CREATININE: 1.13 mg/dL — AB (ref 0.57–1.00)
GFR, EST AFRICAN AMERICAN: 58 mL/min/{1.73_m2} — AB (ref >59)
GFR, EST NON AFRICAN AMERICAN: 50 mL/min/{1.73_m2} — AB (ref >59)
Glucose: 113 mg/dL — ABNORMAL HIGH (ref 65–99)
Potassium: 4.6 mmol/L (ref 3.5–5.2)
Sodium: 142 mmol/L (ref 134–144)

## 2017-02-28 ENCOUNTER — Telehealth: Payer: Self-pay | Admitting: Cardiology

## 2017-02-28 ENCOUNTER — Ambulatory Visit (INDEPENDENT_AMBULATORY_CARE_PROVIDER_SITE_OTHER): Payer: Self-pay | Admitting: *Deleted

## 2017-02-28 DIAGNOSIS — I472 Ventricular tachycardia, unspecified: Secondary | ICD-10-CM

## 2017-02-28 NOTE — Telephone Encounter (Signed)
Spoke with pt and reminded pt of remote transmission that is due today. Pt verbalized understanding.   

## 2017-02-28 NOTE — Progress Notes (Signed)
Remote ICD transmission.   

## 2017-03-01 ENCOUNTER — Encounter: Payer: Self-pay | Admitting: Cardiology

## 2017-03-18 ENCOUNTER — Ambulatory Visit (INDEPENDENT_AMBULATORY_CARE_PROVIDER_SITE_OTHER): Payer: Medicare Other | Admitting: *Deleted

## 2017-03-18 DIAGNOSIS — I472 Ventricular tachycardia, unspecified: Secondary | ICD-10-CM

## 2017-03-18 NOTE — Progress Notes (Signed)
Remote ICD transmission.   

## 2017-03-20 ENCOUNTER — Encounter: Payer: Self-pay | Admitting: Cardiology

## 2017-03-20 LAB — CUP PACEART REMOTE DEVICE CHECK
Battery Voltage: 3 V
Brady Statistic RA Percent Paced: 23.96 %
HighPow Impedance: 532 Ohm
HighPow Impedance: 84 Ohm
Implantable Lead Implant Date: 20140129
Implantable Lead Location: 753860
Implantable Lead Model: 180
Implantable Lead Model: 5076
Implantable Lead Serial Number: 310373
Lead Channel Impedance Value: 456 Ohm
Lead Channel Pacing Threshold Amplitude: 0.5 V
Lead Channel Pacing Threshold Pulse Width: 0.4 ms
Lead Channel Sensing Intrinsic Amplitude: 1 mV
Lead Channel Setting Pacing Amplitude: 2.5 V
MDC IDC LEAD IMPLANT DT: 20140129
MDC IDC LEAD LOCATION: 753859
MDC IDC MSMT LEADCHNL RA SENSING INTR AMPL: 1 mV
MDC IDC MSMT LEADCHNL RV IMPEDANCE VALUE: 551 Ohm
MDC IDC MSMT LEADCHNL RV PACING THRESHOLD AMPLITUDE: 0.625 V
MDC IDC MSMT LEADCHNL RV PACING THRESHOLD PULSEWIDTH: 0.4 ms
MDC IDC MSMT LEADCHNL RV SENSING INTR AMPL: 7.5 mV
MDC IDC MSMT LEADCHNL RV SENSING INTR AMPL: 7.5 mV
MDC IDC PG IMPLANT DT: 20140129
MDC IDC SESS DTM: 20190124200416
MDC IDC SET LEADCHNL RA PACING AMPLITUDE: 2 V
MDC IDC SET LEADCHNL RV PACING PULSEWIDTH: 0.4 ms
MDC IDC SET LEADCHNL RV SENSING SENSITIVITY: 0.3 mV
MDC IDC STAT BRADY AP VP PERCENT: 0.03 %
MDC IDC STAT BRADY AP VS PERCENT: 25.02 %
MDC IDC STAT BRADY AS VP PERCENT: 0.03 %
MDC IDC STAT BRADY AS VS PERCENT: 74.92 %
MDC IDC STAT BRADY RV PERCENT PACED: 0.05 %

## 2017-03-27 LAB — CUP PACEART REMOTE DEVICE CHECK
Battery Voltage: 2.96 V
Brady Statistic AP VP Percent: 0.01 %
Brady Statistic AS VP Percent: 0.03 %
Brady Statistic AS VS Percent: 77.74 %
Date Time Interrogation Session: 20190211083627
HIGH POWER IMPEDANCE MEASURED VALUE: 475 Ohm
HIGH POWER IMPEDANCE MEASURED VALUE: 79 Ohm
Implantable Lead Implant Date: 20140129
Implantable Lead Implant Date: 20140129
Implantable Lead Location: 753860
Implantable Lead Serial Number: 310373
Implantable Pulse Generator Implant Date: 20140129
Lead Channel Impedance Value: 456 Ohm
Lead Channel Pacing Threshold Amplitude: 0.5 V
Lead Channel Pacing Threshold Pulse Width: 0.4 ms
Lead Channel Sensing Intrinsic Amplitude: 1.25 mV
Lead Channel Sensing Intrinsic Amplitude: 7.375 mV
Lead Channel Sensing Intrinsic Amplitude: 7.375 mV
Lead Channel Setting Pacing Amplitude: 2 V
Lead Channel Setting Pacing Amplitude: 2.5 V
Lead Channel Setting Pacing Pulse Width: 0.4 ms
Lead Channel Setting Sensing Sensitivity: 0.3 mV
MDC IDC LEAD LOCATION: 753859
MDC IDC MSMT LEADCHNL RA PACING THRESHOLD PULSEWIDTH: 0.4 ms
MDC IDC MSMT LEADCHNL RA SENSING INTR AMPL: 1.25 mV
MDC IDC MSMT LEADCHNL RV IMPEDANCE VALUE: 513 Ohm
MDC IDC MSMT LEADCHNL RV PACING THRESHOLD AMPLITUDE: 0.625 V
MDC IDC STAT BRADY AP VS PERCENT: 22.22 %
MDC IDC STAT BRADY RA PERCENT PACED: 21.78 %
MDC IDC STAT BRADY RV PERCENT PACED: 0.04 %

## 2017-05-07 DIAGNOSIS — L820 Inflamed seborrheic keratosis: Secondary | ICD-10-CM | POA: Diagnosis not present

## 2017-05-07 DIAGNOSIS — B029 Zoster without complications: Secondary | ICD-10-CM | POA: Diagnosis not present

## 2017-05-10 ENCOUNTER — Encounter: Payer: Medicare Other | Admitting: Nurse Practitioner

## 2017-06-17 ENCOUNTER — Telehealth: Payer: Self-pay | Admitting: Cardiology

## 2017-06-17 ENCOUNTER — Ambulatory Visit: Payer: Medicare Other | Admitting: *Deleted

## 2017-06-17 DIAGNOSIS — I472 Ventricular tachycardia: Secondary | ICD-10-CM | POA: Diagnosis not present

## 2017-06-17 NOTE — Telephone Encounter (Signed)
LMOVM reminding pt to send remote transmission.   

## 2017-06-17 NOTE — Progress Notes (Signed)
Remote ICD transmission.   

## 2017-06-18 NOTE — Progress Notes (Signed)
Not received  

## 2017-06-20 ENCOUNTER — Encounter: Payer: Self-pay | Admitting: Cardiology

## 2017-08-02 DIAGNOSIS — F418 Other specified anxiety disorders: Secondary | ICD-10-CM | POA: Diagnosis not present

## 2017-08-02 DIAGNOSIS — I5022 Chronic systolic (congestive) heart failure: Secondary | ICD-10-CM | POA: Diagnosis not present

## 2017-08-02 DIAGNOSIS — Z0000 Encounter for general adult medical examination without abnormal findings: Secondary | ICD-10-CM | POA: Diagnosis not present

## 2017-08-02 DIAGNOSIS — R739 Hyperglycemia, unspecified: Secondary | ICD-10-CM | POA: Diagnosis not present

## 2017-08-02 DIAGNOSIS — K219 Gastro-esophageal reflux disease without esophagitis: Secondary | ICD-10-CM | POA: Diagnosis not present

## 2017-08-02 DIAGNOSIS — E785 Hyperlipidemia, unspecified: Secondary | ICD-10-CM | POA: Diagnosis not present

## 2017-08-02 DIAGNOSIS — Z23 Encounter for immunization: Secondary | ICD-10-CM | POA: Diagnosis not present

## 2017-08-02 DIAGNOSIS — Z79899 Other long term (current) drug therapy: Secondary | ICD-10-CM | POA: Diagnosis not present

## 2017-09-02 NOTE — Progress Notes (Signed)
Electrophysiology Office Note Date: 09/04/2017  ID:  Michelle Castro, DOB [birth year], MRN [id number]  PCP: Street, [name], MD Electrophysiologist: Caryl Comes  CC: Routine ICD follow-up  Michelle Castro is a 67 y.o. female seen today for Dr Caryl Comes.  She presents today for routine electrophysiology followup.  Since last being seen in our clinic, the patient reports doing reasonably well.  Her daughter in [place] has been diagnosed with breast cancer and is having a mastectomy soon.  The patient will be in [place] for 3 months helping her recover.  She denies chest pain, palpitations, dyspnea, PND, orthopnea, nausea, vomiting, dizziness, syncope, edema, weight gain, or early satiety.  She has not had ICD shocks.   Device History: MDT dual chamber ICD implanted 2014 for , long QT syndrome, PMVT History of appropriate therapy: Yes History of AAD therapy: No   Past Medical History:  Diagnosis Date  . 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 Arthritis   . Breast cancer ([place]) 1986   s/p :L mastectomy  . Dementia   . Depression/ anxiety   . Hypertension   . Long Q-T syndrome    gene testing pending  . PAF (paroxysmal atrial fibrillation) ([place])    not well documented  . PFO (patent foramen ovale)   . Syncope   . Ventricular tachycardia, polymorphic ([place])    long-short   Past Surgical History:  Procedure Laterality Date  . ABDOMINAL HYSTERECTOMY    . BREAST SURGERY     mastectomy  . IMPLANTABLE CARDIOVERTER DEFIBRILLATOR IMPLANT N/A 03/05/2012   MDT dual chamber ICD implanted for secondary prevention for Long QT syndrome  . TOTAL KNEE ARTHROPLASTY     bilateral    Current Outpatient Medications  Medication Sig Dispense Refill  . ALPRAZolam (XANAX) 0.25 MG tablet Take 0.25 mg by mouth at bedtime.  5  . meloxicam (MOBIC) 7.5 MG tablet Take 7.5 mg by mouth daily.  1  . Multiple Vitamin (MULTIVITAMIN WITH MINERALS) TABS Take 1 tablet by mouth  daily.    . propranolol ER (INDERAL LA) 120 MG 24 hr capsule Take two capsules (240 mg) in the morning and one capsule (120 mg) by mouth every evening 270 capsule 3  . spironolactone (ALDACTONE) 25 MG tablet Take 0.5 tablets (12.5 mg total) by mouth daily. 45 tablet 5  . venlafaxine XR (EFFEXOR-XR) 37.5 MG 24 hr capsule Take 37.5 mg by mouth daily with breakfast.    . propranolol (INDERAL) 10 MG tablet Take 1 tablet (10 mg total) by mouth every 6 (six) hours as needed. 30 tablet 0   No current facility-administered medications for this visit.     Allergies:   Codeine   Social History: Social History   Socioeconomic History  . Marital status: Married    Spouse name: Not on file  . Number of children: Not on file  . Years of education: Not on file  . Highest education level: Not on file  Occupational History  . Not on file  Social Needs  . Financial resource strain: Not on file  . Food insecurity:    Worry: Not on file    Inability: Not on file  . Transportation needs:    Medical: Not on file    Non-medical: Not on file  Tobacco Use  . Smoking status: Never Smoker  . Smokeless tobacco: Never Used  Substance and Sexual Activity  . Alcohol use: No  . Drug use:  No  . Sexual activity: Not on file  Lifestyle  . Physical activity:    Days per week: Not on file    Minutes per session: Not on file  . Stress: Not on file  Relationships  . Social connections:    Talks on phone: Not on file    Gets together: Not on file    Attends religious service: Not on file    Active member of club or organization: Not on file    Attends meetings of clubs or organizations: Not on file    Relationship status: Not on file  . Intimate partner violence:    Fear of current or ex partner: Not on file    Emotionally abused: Not on file    Physically abused: Not on file    Forced sexual activity: Not on file  Other Topics Concern  . Not on file  Social History Narrative  . Not on file     Family History: Family History  Problem Relation Age of Onset  . Cancer Father   . Hypertension Father   . Cancer Brother     Review of Systems: All other systems reviewed and are otherwise negative except as noted above.   Physical Exam: VS:  BP 110/78   Pulse 67   Ht 5\' 7"  (1.702 m)   Wt 219 lb 6.4 oz (99.5 kg)   SpO2 97%   BMI 34.36 kg/m  , BMI Body mass index is 34.36 kg/m.  GEN- The patient is well appearing, alert and oriented x 3 today.   HEENT: normocephalic, atraumatic; sclera clear, conjunctiva pink; hearing intact; oropharynx clear; neck supple Lungs- Clear to ausculation bilaterally, normal work of breathing.  No wheezes, rales, rhonchi Heart- Regular rate and rhythm GI- soft, non-tender, non-distended, bowel sounds present Extremities- no clubbing, cyanosis, or edema MS- no significant deformity or atrophy Skin- warm and dry, no rash or lesion; ICD pocket well healed Psych- euthymic mood, full affect Neuro- strength and sensation are intact  ICD interrogation- reviewed in detail today,  See PACEART report  EKG:  EKG is ordered today. The ekg ordered today shows sinus rhythm, PVC's.   Recent Labs: 12/31/2016: Magnesium 2.0 02/19/2017: BUN 15; Creatinine, Ser 1.13; Potassium 4.6; Sodium 142   Wt Readings from Last 3 Encounters:  09/04/17 219 lb 6.4 oz (99.5 kg)  09/17/16 231 lb (104.8 kg)  10/11/15 221 lb 3.2 oz (100.3 kg)     Other studies Reviewed: Additional studies/ records that were reviewed today include: Dr Olin Pia office notes   Assessment and Plan:  1.  Long QT syndrome Normal ICD function See Pace Art report No changes today Continue to avoid QT prolonging drugs Continue Propranolol Daughter has been evaluated Keep K>3.9, Mg>1.8  2.  NICM EF 40-45% by echo 2017 Euvolemic on exam Class I HF symptoms BMET, Mg today    Current medicines are reviewed at length with the patient today.   The patient does not have concerns  regarding her medicines.  The following changes were made today:  none  Labs/ tests ordered today include: BMET, Mg Orders Placed This Encounter  Procedures  . Basic metabolic panel  . Magnesium  . CUP PACEART [place]  . EKG 12-Lead     Disposition:   Follow up with [facility], Dr Caryl Comes 6 months    Signed, Chanetta Marshall, NP 09/04/2017 1:12 PM  [place] [address] [phone] (office) [phone] (fax)

## 2017-09-04 ENCOUNTER — Encounter: Payer: Self-pay | Admitting: Nurse Practitioner

## 2017-09-04 ENCOUNTER — Ambulatory Visit (INDEPENDENT_AMBULATORY_CARE_PROVIDER_SITE_OTHER): Payer: Medicare Other | Admitting: Nurse Practitioner

## 2017-09-04 VITALS — BP 110/78 | HR 67 | Ht 67.0 in | Wt 219.4 lb

## 2017-09-04 DIAGNOSIS — I428 Other cardiomyopathies: Secondary | ICD-10-CM | POA: Diagnosis not present

## 2017-09-04 DIAGNOSIS — I4581 Long QT syndrome: Secondary | ICD-10-CM | POA: Diagnosis not present

## 2017-09-04 LAB — CUP PACEART INCLINIC DEVICE CHECK
Date Time Interrogation Session: 20190731095949
Implantable Lead Implant Date: 20140129
Implantable Lead Implant Date: 20140129
Implantable Lead Location: 753859
Implantable Lead Serial Number: 310373
Implantable Pulse Generator Implant Date: 20140129
MDC IDC LEAD LOCATION: 753860

## 2017-09-04 LAB — BASIC METABOLIC PANEL
BUN/Creatinine Ratio: 16 (ref 12–28)
BUN: 18 mg/dL (ref 8–27)
CO2: 19 mmol/L — AB (ref 20–29)
CREATININE: 1.13 mg/dL — AB (ref 0.57–1.00)
Calcium: 10 mg/dL (ref 8.7–10.3)
Chloride: 104 mmol/L (ref 96–106)
GFR calc Af Amer: 57 mL/min/{1.73_m2} — ABNORMAL LOW (ref >59)
GFR calc non Af Amer: 50 mL/min/{1.73_m2} — ABNORMAL LOW (ref >59)
Glucose: 96 mg/dL (ref 65–99)
Potassium: 5 mmol/L (ref 3.5–5.2)
SODIUM: 140 mmol/L (ref 134–144)

## 2017-09-04 LAB — MAGNESIUM: MAGNESIUM: 2.1 mg/dL (ref 1.6–2.3)

## 2017-09-04 MED ORDER — PROPRANOLOL HCL 10 MG PO TABS: 10.0000 mg | ORAL_TABLET | Freq: Four times a day (QID) | ORAL | 0 refills | Status: DC | PRN

## 2017-09-04 MED ORDER — SPIRONOLACTONE 25 MG PO TABS: 12.5000 mg | ORAL_TABLET | Freq: Every day | ORAL | 5 refills | Status: DC

## 2017-09-04 MED ORDER — PROPRANOLOL HCL ER 120 MG PO CP24: ORAL_CAPSULE | ORAL | 3 refills | Status: DC

## 2017-09-04 NOTE — Patient Instructions (Addendum)
Medication Instructions:   START TAKING PROPRANOLOL 10 MG AS NEEDED  FOR PALPITATIONS EVERY 4 TO 6 HOURS AS NEEDED   If you need a refill on your cardiac medications before your next appointment, please call your pharmacy.  Labwork:  BMET  AND MAG  TODAY     Testing/Procedures: NONE ORDERED  TODAY    Follow-Up:  Your physician wants you to follow-up in:  [place] will receive a reminder letter in the mail two months in advance. If you don't receive a letter, please call our office to schedule the follow-up appointment.   Remote monitoring is used to monitor your Pacemaker of ICD from home. This monitoring reduces the number of office visits required to check your device to one time per year. It allows [place] to keep an eye on the functioning of your device to ensure it is working properly. You are scheduled for a device check from home on . 12-06-2017 You may send your transmission at any time that day. If you have a wireless device, the transmission will be sent automatically. After your physician reviews your transmission, you will receive a postcard with your next transmission date.     Any Other Special Instructions Will Be Listed Below (If Applicable).

## 2017-11-27 DIAGNOSIS — Z23 Encounter for immunization: Secondary | ICD-10-CM | POA: Diagnosis not present

## 2017-11-29 DIAGNOSIS — M2011 Hallux valgus (acquired), right foot: Secondary | ICD-10-CM | POA: Diagnosis not present

## 2017-12-06 ENCOUNTER — Ambulatory Visit (INDEPENDENT_AMBULATORY_CARE_PROVIDER_SITE_OTHER): Payer: Medicare Other | Admitting: *Deleted

## 2017-12-06 DIAGNOSIS — I428 Other cardiomyopathies: Secondary | ICD-10-CM

## 2017-12-06 DIAGNOSIS — I4581 Long QT syndrome: Secondary | ICD-10-CM | POA: Diagnosis not present

## 2017-12-07 NOTE — Progress Notes (Signed)
Remote ICD transmission.   

## 2017-12-09 ENCOUNTER — Encounter: Payer: Self-pay | Admitting: Cardiology

## 2017-12-09 NOTE — Progress Notes (Signed)
Letter  

## 2018-02-05 LAB — CUP PACEART REMOTE DEVICE CHECK
Brady Statistic AP VP Percent: 0.04 %
Brady Statistic AP VS Percent: 41.37 %
Brady Statistic AS VS Percent: 58.57 %
Brady Statistic RA Percent Paced: 40.12 %
Brady Statistic RV Percent Paced: 0.06 %
Date Time Interrogation Session: 20191101062825
HIGH POWER IMPEDANCE MEASURED VALUE: 82 Ohm
HighPow Impedance: 665 Ohm
Implantable Lead Implant Date: 20140129
Implantable Lead Location: 753859
Implantable Lead Location: 753860
Implantable Lead Serial Number: 310373
Implantable Pulse Generator Implant Date: 20140129
Lead Channel Impedance Value: 513 Ohm
Lead Channel Pacing Threshold Amplitude: 0.625 V
Lead Channel Pacing Threshold Pulse Width: 0.4 ms
Lead Channel Pacing Threshold Pulse Width: 0.4 ms
Lead Channel Sensing Intrinsic Amplitude: 0.5 mV
Lead Channel Sensing Intrinsic Amplitude: 7.5 mV
Lead Channel Sensing Intrinsic Amplitude: 7.5 mV
Lead Channel Setting Pacing Amplitude: 2 V
Lead Channel Setting Pacing Amplitude: 2.5 V
Lead Channel Setting Pacing Pulse Width: 0.4 ms
Lead Channel Setting Sensing Sensitivity: 0.3 mV
MDC IDC LEAD IMPLANT DT: 20140129
MDC IDC MSMT BATTERY VOLTAGE: 2.9 V
MDC IDC MSMT LEADCHNL RA IMPEDANCE VALUE: 456 Ohm
MDC IDC MSMT LEADCHNL RA PACING THRESHOLD AMPLITUDE: 0.5 V
MDC IDC MSMT LEADCHNL RA SENSING INTR AMPL: 0.5 mV
MDC IDC STAT BRADY AS VP PERCENT: 0.02 %

## 2018-03-10 ENCOUNTER — Ambulatory Visit (INDEPENDENT_AMBULATORY_CARE_PROVIDER_SITE_OTHER): Payer: Medicare Other

## 2018-03-10 DIAGNOSIS — I472 Ventricular tachycardia, unspecified: Secondary | ICD-10-CM

## 2018-03-12 LAB — CUP PACEART REMOTE DEVICE CHECK
Battery Voltage: 2.81 V
Brady Statistic AP VP Percent: 0.11 %
Brady Statistic AP VS Percent: 49.87 %
Brady Statistic AS VP Percent: 0.04 %
Brady Statistic RA Percent Paced: 43.77 %
Brady Statistic RV Percent Paced: 0.15 %
HIGH POWER IMPEDANCE MEASURED VALUE: 513 Ohm
HIGH POWER IMPEDANCE MEASURED VALUE: 75 Ohm
Implantable Lead Implant Date: 20140129
Implantable Lead Location: 753859
Implantable Lead Model: 180
Implantable Lead Model: 5076
Implantable Lead Serial Number: 310373
Lead Channel Impedance Value: 513 Ohm
Lead Channel Pacing Threshold Amplitude: 0.625 V
Lead Channel Pacing Threshold Pulse Width: 0.4 ms
Lead Channel Sensing Intrinsic Amplitude: 1.5 mV
Lead Channel Sensing Intrinsic Amplitude: 6.5 mV
Lead Channel Sensing Intrinsic Amplitude: 6.5 mV
Lead Channel Setting Pacing Amplitude: 2 V
Lead Channel Setting Pacing Pulse Width: 0.4 ms
Lead Channel Setting Sensing Sensitivity: 0.3 mV
MDC IDC LEAD IMPLANT DT: 20140129
MDC IDC LEAD LOCATION: 753860
MDC IDC MSMT LEADCHNL RA IMPEDANCE VALUE: 399 Ohm
MDC IDC MSMT LEADCHNL RA PACING THRESHOLD AMPLITUDE: 0.5 V
MDC IDC MSMT LEADCHNL RA SENSING INTR AMPL: 1.5 mV
MDC IDC MSMT LEADCHNL RV PACING THRESHOLD PULSEWIDTH: 0.4 ms
MDC IDC PG IMPLANT DT: 20140129
MDC IDC SESS DTM: 20200203062206
MDC IDC SET LEADCHNL RV PACING AMPLITUDE: 2.5 V
MDC IDC STAT BRADY AS VS PERCENT: 49.97 %

## 2018-03-17 DIAGNOSIS — F418 Other specified anxiety disorders: Secondary | ICD-10-CM | POA: Diagnosis not present

## 2018-03-17 DIAGNOSIS — I5022 Chronic systolic (congestive) heart failure: Secondary | ICD-10-CM | POA: Diagnosis not present

## 2018-03-17 DIAGNOSIS — F4321 Adjustment disorder with depressed mood: Secondary | ICD-10-CM | POA: Diagnosis not present

## 2018-03-17 DIAGNOSIS — Z634 Disappearance and death of family member: Secondary | ICD-10-CM | POA: Diagnosis not present

## 2018-03-18 NOTE — Progress Notes (Signed)
Remote ICD transmission.   

## 2018-04-03 ENCOUNTER — Ambulatory Visit
Admission: RE | Admit: 2018-04-03 | Discharge: 2018-04-03 | Disposition: A | Discharge status: Home / Self Care | Payer: Medicare Other | Source: Ambulatory Visit | Attending: Family Medicine | Admitting: Family Medicine

## 2018-04-03 ENCOUNTER — Encounter: Payer: Self-pay | Admitting: Internal Medicine

## 2018-04-03 ENCOUNTER — Ambulatory Visit (INDEPENDENT_AMBULATORY_CARE_PROVIDER_SITE_OTHER): Payer: Medicare Other | Admitting: Internal Medicine

## 2018-04-03 VITALS — BP 120/80 | HR 66 | Ht 67.0 in | Wt 222.2 lb

## 2018-04-03 DIAGNOSIS — I472 Ventricular tachycardia, unspecified: Secondary | ICD-10-CM

## 2018-04-03 DIAGNOSIS — I4581 Long QT syndrome: Secondary | ICD-10-CM

## 2018-04-03 DIAGNOSIS — Z1231 Encounter for screening mammogram for malignant neoplasm of breast: Secondary | ICD-10-CM

## 2018-04-03 DIAGNOSIS — I428 Other cardiomyopathies: Secondary | ICD-10-CM | POA: Diagnosis not present

## 2018-04-03 DIAGNOSIS — Z95810 Presence of automatic (implantable) cardiac defibrillator: Secondary | ICD-10-CM | POA: Diagnosis not present

## 2018-04-03 DIAGNOSIS — Z853 Personal history of malignant neoplasm of breast: Secondary | ICD-10-CM | POA: Diagnosis not present

## 2018-04-03 NOTE — Progress Notes (Signed)
Patient Care Team: Street, [name], MD as PCP - General   HPI  Michelle Castro is a 68 y.o. female Seen following the history of atrial fibrillation modest nonischemic cardiomyopathy  Has hx of  polymorphic ventricular tachycardia. QT prolongation was noted with normal electrolytes. Sertraline was thought to be potentially contributing; polymorphic VT persisted despite its discontinuation  she underwent ICD implantation  Gene testing was apparently started but it was done through her sister;  result were negative  She has hx of ICD shock associated of polymorphic ventricular tachycardia/fibrillation which occurred while at rest. She had run out of her propranolol prior to this..  She also was, upon interrogation, noted to have an episode of self terminating polymorphic VT 2/16. There have been other shorter episodes.  The patient denies chest pain, shortness of breath, nocturnal dyspnea, orthopnea or peripheral edema.  There have been no palpitations, lightheadedness or syncope.   Her daughter died of breast cancer Jan 22, 2023.  There are 2 children, [name] and [name].  Husband's name is [name].  All are grief struck.  7/17 Echo EF 40-45%   Past Medical History:  Diagnosis Date  . 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 Arthritis   . Breast cancer ([place]) 1986   s/p :L mastectomy  . Dementia ([place])   . Depression/ anxiety   . Hypertension   . Long Q-T syndrome    gene testing pending  . PAF (paroxysmal atrial fibrillation) ([place])    not well documented  . PFO (patent foramen ovale)   . Syncope   . Ventricular tachycardia, polymorphic ([place])    long-short    Past Surgical History:  Procedure Laterality Date  . ABDOMINAL HYSTERECTOMY    . AUGMENTATION MAMMAPLASTY    . BREAST BIOPSY Right   . BREAST SURGERY     mastectomy  . IMPLANTABLE CARDIOVERTER DEFIBRILLATOR IMPLANT N/A 03/05/2012   MDT dual chamber ICD implanted for secondary  prevention for Long QT syndrome  . MASTECTOMY Left   . TOTAL KNEE ARTHROPLASTY     bilateral    Current Outpatient Medications  Medication Sig Dispense Refill  . LORazepam (ATIVAN) 1 MG tablet Take 0.5-1 tablets by mouth at bedtime as needed. May take during the day as well    . Multiple Vitamin (MULTIVITAMIN WITH MINERALS) TABS Take 1 tablet by mouth daily.    . propranolol (INDERAL) 10 MG tablet Take 1 tablet (10 mg total) by mouth every 6 (six) hours as needed. 30 tablet 0  . propranolol ER (INDERAL LA) 120 MG 24 hr capsule Take two capsules (240 mg) in the morning and one capsule (120 mg) by mouth every evening 270 capsule 3  . spironolactone (ALDACTONE) 25 MG tablet Take 0.5 tablets (12.5 mg total) by mouth daily. 45 tablet 5  . venlafaxine XR (EFFEXOR-XR) 37.5 MG 24 hr capsule Take 37.5 mg by mouth daily with breakfast.     No current facility-administered medications for this visit.     Allergies  Allergen Reactions  . Codeine Nausea Only    Review of Systems negative except from HPI and PMH  Physical Exam BP 120/80   Pulse 66   Ht 5\' 7"  (1.702 m)   Wt 222 lb 3.2 oz (100.8 kg)   SpO2 95%   BMI 34.80 kg/m  Well developed and well nourished in no acute distress HENT normal Neck supple with JVP-flat Clear Device pocket well healed;  without hematoma or erythema.  There is no tethering  Regular rate and rhythm, no  gallop murmur Abd-soft with active BS No Clubbing cyanosis  edema Skin-warm and dry A & Oriented  Grossly normal sensory and motor function   ECG Apace  18/13/42 -61 rbbb pac   Assessment and  Plan  Long QT syndrome-probable  Ventricular tachycardia-nonsustained-polymorphic  ICD implantation-Medtronic   The patient's device was interrogated.  The information was reviewed. No changes were made in the programming.  '  Nonischemic cardiomyopathy  Psychosocial Stress    No significant interval VT-nonsustained.  Tolerating her  beta-blockers.  Cardiomyopathy-mild.  Euvolemic continue current meds  Grief    We spent more than 50% of our >25 min visit in face to face counseling regarding the above     The children are advised to to use QTdrugs.org website  Her daughter is being seen in [place]

## 2018-04-03 NOTE — Patient Instructions (Signed)
Medication Instructions:  Your physician recommends that you continue on your current medications as directed. Please refer to the Current Medication list given to you today.  Labwork: You will have labs drawn today: BMP and Mg  Testing/Procedures: None ordered.  Follow-Up: Your physician recommends that you schedule a follow-up appointment in:   6 months for a BMP and Mg  12 months follow up with Dr Caryl Comes  Any Other Special Instructions Will Be Listed Below (If Applicable).     If you need a refill on your cardiac medications before your next appointment, please call your pharmacy.

## 2018-04-04 LAB — BASIC METABOLIC PANEL
BUN/Creatinine Ratio: 21 (ref 12–28)
BUN: 21 mg/dL (ref 8–27)
CALCIUM: 9.5 mg/dL (ref 8.7–10.3)
CO2: 20 mmol/L (ref 20–29)
Chloride: 108 mmol/L — ABNORMAL HIGH (ref 96–106)
Creatinine, Ser: 0.98 mg/dL (ref 0.57–1.00)
GFR calc Af Amer: 68 mL/min/{1.73_m2} (ref >59)
GFR calc non Af Amer: 59 mL/min/{1.73_m2} — ABNORMAL LOW (ref >59)
Glucose: 92 mg/dL (ref 65–99)
POTASSIUM: 4.9 mmol/L (ref 3.5–5.2)
Sodium: 141 mmol/L (ref 134–144)

## 2018-04-04 LAB — CUP PACEART INCLINIC DEVICE CHECK
Brady Statistic AS VP Percent: 0.03 %
Brady Statistic RA Percent Paced: 41.3 %
Brady Statistic RV Percent Paced: 0.1 %
Date Time Interrogation Session: 20200227194821
HighPow Impedance: 513 Ohm
HighPow Impedance: 72 Ohm
Implantable Lead Implant Date: 20140129
Implantable Lead Location: 753859
Implantable Lead Model: 180
Implantable Lead Model: 5076
Implantable Lead Serial Number: 310373
Implantable Pulse Generator Implant Date: 20140129
Lead Channel Impedance Value: 399 Ohm
Lead Channel Impedance Value: 532 Ohm
Lead Channel Pacing Threshold Amplitude: 0.625 V
Lead Channel Pacing Threshold Pulse Width: 0.4 ms
Lead Channel Pacing Threshold Pulse Width: 0.4 ms
Lead Channel Sensing Intrinsic Amplitude: 2.125 mV
Lead Channel Sensing Intrinsic Amplitude: 6.125 mV
Lead Channel Setting Pacing Amplitude: 2 V
Lead Channel Setting Pacing Pulse Width: 0.4 ms
Lead Channel Setting Sensing Sensitivity: 0.3 mV
MDC IDC LEAD IMPLANT DT: 20140129
MDC IDC LEAD LOCATION: 753860
MDC IDC MSMT BATTERY VOLTAGE: 2.81 V
MDC IDC MSMT LEADCHNL RA PACING THRESHOLD AMPLITUDE: 0.5 V
MDC IDC MSMT LEADCHNL RA SENSING INTR AMPL: 1.25 mV
MDC IDC MSMT LEADCHNL RV SENSING INTR AMPL: 10.375 mV
MDC IDC SET LEADCHNL RV PACING AMPLITUDE: 2.5 V
MDC IDC STAT BRADY AP VP PERCENT: 0.08 %
MDC IDC STAT BRADY AP VS PERCENT: 45.31 %
MDC IDC STAT BRADY AS VS PERCENT: 54.58 %

## 2018-04-04 LAB — MAGNESIUM: Magnesium: 2.1 mg/dL (ref 1.6–2.3)

## 2018-04-07 ENCOUNTER — Other Ambulatory Visit: Payer: Self-pay | Admitting: Family Medicine

## 2018-04-07 DIAGNOSIS — R928 Other abnormal and inconclusive findings on diagnostic imaging of breast: Secondary | ICD-10-CM

## 2018-04-09 ENCOUNTER — Ambulatory Visit
Admission: RE | Admit: 2018-04-09 | Discharge: 2018-04-09 | Disposition: A | Discharge status: Home / Self Care | Payer: Medicare Other | Source: Ambulatory Visit | Attending: Family Medicine | Admitting: Family Medicine

## 2018-04-09 ENCOUNTER — Other Ambulatory Visit: Payer: Self-pay | Admitting: Family Medicine

## 2018-04-09 DIAGNOSIS — N6312 Unspecified lump in the right breast, upper inner quadrant: Secondary | ICD-10-CM | POA: Diagnosis not present

## 2018-04-09 DIAGNOSIS — R928 Other abnormal and inconclusive findings on diagnostic imaging of breast: Secondary | ICD-10-CM

## 2018-04-09 DIAGNOSIS — N6311 Unspecified lump in the right breast, upper outer quadrant: Secondary | ICD-10-CM | POA: Diagnosis not present

## 2018-04-09 DIAGNOSIS — R922 Inconclusive mammogram: Secondary | ICD-10-CM | POA: Diagnosis not present

## 2018-06-16 ENCOUNTER — Ambulatory Visit (INDEPENDENT_AMBULATORY_CARE_PROVIDER_SITE_OTHER): Payer: Medicare Other | Admitting: *Deleted

## 2018-06-16 ENCOUNTER — Other Ambulatory Visit: Payer: Self-pay

## 2018-06-16 DIAGNOSIS — I472 Ventricular tachycardia, unspecified: Secondary | ICD-10-CM

## 2018-06-16 DIAGNOSIS — I4581 Long QT syndrome: Secondary | ICD-10-CM

## 2018-06-17 LAB — CUP PACEART REMOTE DEVICE CHECK
Battery Voltage: 2.77 V
Brady Statistic AP VP Percent: 0.04 %
Brady Statistic AP VS Percent: 31.96 %
Brady Statistic AS VP Percent: 0.04 %
Brady Statistic AS VS Percent: 67.95 %
Brady Statistic RA Percent Paced: 29.66 %
Brady Statistic RV Percent Paced: 0.08 %
Date Time Interrogation Session: 20200511084225
HighPow Impedance: 513 Ohm
HighPow Impedance: 80 Ohm
Implantable Lead Implant Date: 20140129
Implantable Lead Implant Date: 20140129
Implantable Lead Location: 753859
Implantable Lead Location: 753860
Implantable Lead Model: 180
Implantable Lead Model: 5076
Implantable Lead Serial Number: 310373
Implantable Pulse Generator Implant Date: 20140129
Lead Channel Impedance Value: 418 Ohm
Lead Channel Impedance Value: 513 Ohm
Lead Channel Pacing Threshold Amplitude: 0.5 V
Lead Channel Pacing Threshold Amplitude: 0.625 V
Lead Channel Pacing Threshold Pulse Width: 0.4 ms
Lead Channel Pacing Threshold Pulse Width: 0.4 ms
Lead Channel Sensing Intrinsic Amplitude: 1.25 mV
Lead Channel Sensing Intrinsic Amplitude: 1.25 mV
Lead Channel Sensing Intrinsic Amplitude: 7 mV
Lead Channel Sensing Intrinsic Amplitude: 7 mV
Lead Channel Setting Pacing Amplitude: 2 V
Lead Channel Setting Pacing Amplitude: 2.5 V
Lead Channel Setting Pacing Pulse Width: 0.4 ms
Lead Channel Setting Sensing Sensitivity: 0.3 mV

## 2018-06-23 NOTE — Progress Notes (Signed)
Remote ICD transmission.   

## 2018-09-01 ENCOUNTER — Other Ambulatory Visit: Payer: Medicare Other

## 2018-09-02 ENCOUNTER — Other Ambulatory Visit: Payer: Self-pay | Admitting: Nurse Practitioner

## 2018-09-16 ENCOUNTER — Ambulatory Visit (INDEPENDENT_AMBULATORY_CARE_PROVIDER_SITE_OTHER): Payer: Medicare Other | Admitting: *Deleted

## 2018-09-16 DIAGNOSIS — I4581 Long QT syndrome: Secondary | ICD-10-CM

## 2018-09-16 DIAGNOSIS — I4729 Other ventricular tachycardia: Secondary | ICD-10-CM

## 2018-09-16 DIAGNOSIS — I472 Ventricular tachycardia: Secondary | ICD-10-CM

## 2018-09-17 LAB — CUP PACEART REMOTE DEVICE CHECK
Battery Voltage: 2.68 V
Brady Statistic AP VP Percent: 0.02 %
Brady Statistic AP VS Percent: 23.65 %
Brady Statistic AS VP Percent: 0.04 %
Brady Statistic AS VS Percent: 76.29 %
Brady Statistic RA Percent Paced: 22.87 %
Brady Statistic RV Percent Paced: 0.06 %
Date Time Interrogation Session: 20200811052305
HighPow Impedance: 513 Ohm
HighPow Impedance: 83 Ohm
Implantable Lead Implant Date: 20140129
Implantable Lead Implant Date: 20140129
Implantable Lead Location: 753859
Implantable Lead Location: 753860
Implantable Lead Model: 180
Implantable Lead Model: 5076
Implantable Lead Serial Number: 310373
Implantable Pulse Generator Implant Date: 20140129
Lead Channel Impedance Value: 418 Ohm
Lead Channel Impedance Value: 532 Ohm
Lead Channel Pacing Threshold Amplitude: 0.5 V
Lead Channel Pacing Threshold Amplitude: 0.625 V
Lead Channel Pacing Threshold Pulse Width: 0.4 ms
Lead Channel Pacing Threshold Pulse Width: 0.4 ms
Lead Channel Sensing Intrinsic Amplitude: 0.5 mV
Lead Channel Sensing Intrinsic Amplitude: 0.5 mV
Lead Channel Sensing Intrinsic Amplitude: 6.875 mV
Lead Channel Sensing Intrinsic Amplitude: 6.875 mV
Lead Channel Setting Pacing Amplitude: 2 V
Lead Channel Setting Pacing Amplitude: 2.5 V
Lead Channel Setting Pacing Pulse Width: 0.4 ms
Lead Channel Setting Sensing Sensitivity: 0.3 mV

## 2018-09-22 ENCOUNTER — Telehealth: Payer: Self-pay

## 2018-09-22 NOTE — Telephone Encounter (Signed)
Informed pt of ERI alert. Pt verbalized understanding. Will schedule monthly battery checks.

## 2018-09-25 NOTE — Progress Notes (Signed)
Remote ICD transmission.   

## 2018-09-30 ENCOUNTER — Other Ambulatory Visit: Payer: Self-pay | Admitting: Nurse Practitioner

## 2018-10-13 DIAGNOSIS — Z853 Personal history of malignant neoplasm of breast: Secondary | ICD-10-CM | POA: Diagnosis not present

## 2018-10-13 DIAGNOSIS — Z8543 Personal history of malignant neoplasm of ovary: Secondary | ICD-10-CM | POA: Diagnosis not present

## 2018-10-18 DIAGNOSIS — Z853 Personal history of malignant neoplasm of breast: Secondary | ICD-10-CM | POA: Diagnosis not present

## 2018-10-18 DIAGNOSIS — Z8543 Personal history of malignant neoplasm of ovary: Secondary | ICD-10-CM | POA: Diagnosis not present

## 2018-10-23 ENCOUNTER — Ambulatory Visit (INDEPENDENT_AMBULATORY_CARE_PROVIDER_SITE_OTHER): Payer: Medicare Other | Admitting: *Deleted

## 2018-10-23 DIAGNOSIS — I472 Ventricular tachycardia, unspecified: Secondary | ICD-10-CM

## 2018-10-23 LAB — CUP PACEART REMOTE DEVICE CHECK
Battery Voltage: 2.67 V
Brady Statistic AP VP Percent: 0.03 %
Brady Statistic AP VS Percent: 27.94 %
Brady Statistic AS VP Percent: 0.03 %
Brady Statistic AS VS Percent: 72 %
Brady Statistic RA Percent Paced: 26.23 %
Brady Statistic RV Percent Paced: 0.06 %
Date Time Interrogation Session: 20200917083724
HighPow Impedance: 532 Ohm
HighPow Impedance: 83 Ohm
Implantable Lead Implant Date: 20140129
Implantable Lead Implant Date: 20140129
Implantable Lead Location: 753859
Implantable Lead Location: 753860
Implantable Lead Model: 180
Implantable Lead Model: 5076
Implantable Lead Serial Number: 310373
Implantable Pulse Generator Implant Date: 20140129
Lead Channel Impedance Value: 418 Ohm
Lead Channel Impedance Value: 532 Ohm
Lead Channel Pacing Threshold Amplitude: 0.5 V
Lead Channel Pacing Threshold Amplitude: 0.5 V
Lead Channel Pacing Threshold Pulse Width: 0.4 ms
Lead Channel Pacing Threshold Pulse Width: 0.4 ms
Lead Channel Sensing Intrinsic Amplitude: 1.375 mV
Lead Channel Sensing Intrinsic Amplitude: 1.375 mV
Lead Channel Sensing Intrinsic Amplitude: 7.25 mV
Lead Channel Sensing Intrinsic Amplitude: 7.25 mV
Lead Channel Setting Pacing Amplitude: 2 V
Lead Channel Setting Pacing Amplitude: 2.5 V
Lead Channel Setting Pacing Pulse Width: 0.4 ms
Lead Channel Setting Sensing Sensitivity: 0.3 mV

## 2018-10-27 ENCOUNTER — Encounter: Payer: Self-pay | Admitting: Cardiology

## 2018-10-27 NOTE — Progress Notes (Signed)
Remote ICD transmission.   

## 2018-11-12 DIAGNOSIS — L820 Inflamed seborrheic keratosis: Secondary | ICD-10-CM | POA: Diagnosis not present

## 2018-11-12 DIAGNOSIS — B029 Zoster without complications: Secondary | ICD-10-CM | POA: Diagnosis not present

## 2018-11-14 ENCOUNTER — Other Ambulatory Visit: Payer: Self-pay | Admitting: Nurse Practitioner

## 2018-11-24 ENCOUNTER — Ambulatory Visit (INDEPENDENT_AMBULATORY_CARE_PROVIDER_SITE_OTHER): Payer: Medicare Other | Admitting: *Deleted

## 2018-11-24 DIAGNOSIS — I4729 Other ventricular tachycardia: Secondary | ICD-10-CM

## 2018-11-24 DIAGNOSIS — I4581 Long QT syndrome: Secondary | ICD-10-CM

## 2018-11-24 DIAGNOSIS — I472 Ventricular tachycardia: Secondary | ICD-10-CM

## 2018-11-24 LAB — CUP PACEART REMOTE DEVICE CHECK
Battery Voltage: 2.66 V
Brady Statistic AP VP Percent: 0.06 %
Brady Statistic AP VS Percent: 43.92 %
Brady Statistic AS VP Percent: 0.04 %
Brady Statistic AS VS Percent: 55.99 %
Brady Statistic RA Percent Paced: 40.64 %
Brady Statistic RV Percent Paced: 0.09 %
Date Time Interrogation Session: 20201019062825
HighPow Impedance: 589 Ohm
HighPow Impedance: 87 Ohm
Implantable Lead Implant Date: 20140129
Implantable Lead Implant Date: 20140129
Implantable Lead Location: 753859
Implantable Lead Location: 753860
Implantable Lead Model: 180
Implantable Lead Model: 5076
Implantable Lead Serial Number: 310373
Implantable Pulse Generator Implant Date: 20140129
Lead Channel Impedance Value: 418 Ohm
Lead Channel Impedance Value: 551 Ohm
Lead Channel Pacing Threshold Amplitude: 0.5 V
Lead Channel Pacing Threshold Amplitude: 0.625 V
Lead Channel Pacing Threshold Pulse Width: 0.4 ms
Lead Channel Pacing Threshold Pulse Width: 0.4 ms
Lead Channel Sensing Intrinsic Amplitude: 0.625 mV
Lead Channel Sensing Intrinsic Amplitude: 0.625 mV
Lead Channel Sensing Intrinsic Amplitude: 7.75 mV
Lead Channel Sensing Intrinsic Amplitude: 7.75 mV
Lead Channel Setting Pacing Amplitude: 2 V
Lead Channel Setting Pacing Amplitude: 2.5 V
Lead Channel Setting Pacing Pulse Width: 0.4 ms
Lead Channel Setting Sensing Sensitivity: 0.3 mV

## 2018-12-08 DIAGNOSIS — K219 Gastro-esophageal reflux disease without esophagitis: Secondary | ICD-10-CM | POA: Diagnosis not present

## 2018-12-08 DIAGNOSIS — E785 Hyperlipidemia, unspecified: Secondary | ICD-10-CM | POA: Diagnosis not present

## 2018-12-08 DIAGNOSIS — Z95810 Presence of automatic (implantable) cardiac defibrillator: Secondary | ICD-10-CM | POA: Diagnosis not present

## 2018-12-08 DIAGNOSIS — Z23 Encounter for immunization: Secondary | ICD-10-CM | POA: Diagnosis not present

## 2018-12-08 DIAGNOSIS — R739 Hyperglycemia, unspecified: Secondary | ICD-10-CM | POA: Diagnosis not present

## 2018-12-08 DIAGNOSIS — Z0000 Encounter for general adult medical examination without abnormal findings: Secondary | ICD-10-CM | POA: Diagnosis not present

## 2018-12-08 DIAGNOSIS — I4581 Long QT syndrome: Secondary | ICD-10-CM | POA: Diagnosis not present

## 2018-12-08 DIAGNOSIS — Z79899 Other long term (current) drug therapy: Secondary | ICD-10-CM | POA: Diagnosis not present

## 2018-12-08 DIAGNOSIS — I5022 Chronic systolic (congestive) heart failure: Secondary | ICD-10-CM | POA: Diagnosis not present

## 2018-12-12 NOTE — Progress Notes (Signed)
Remote ICD transmission.   

## 2018-12-25 ENCOUNTER — Ambulatory Visit (INDEPENDENT_AMBULATORY_CARE_PROVIDER_SITE_OTHER): Payer: Medicare Other | Admitting: *Deleted

## 2018-12-25 DIAGNOSIS — I4581 Long QT syndrome: Secondary | ICD-10-CM

## 2018-12-25 DIAGNOSIS — I4729 Other ventricular tachycardia: Secondary | ICD-10-CM

## 2018-12-25 LAB — CUP PACEART REMOTE DEVICE CHECK
Battery Voltage: 2.65 V
Brady Statistic AP VP Percent: 0.04 %
Brady Statistic AP VS Percent: 30.43 %
Brady Statistic AS VP Percent: 0.03 %
Brady Statistic AS VS Percent: 69.5 %
Brady Statistic RA Percent Paced: 29.53 %
Brady Statistic RV Percent Paced: 0.07 %
Date Time Interrogation Session: 20201119044224
HighPow Impedance: 83 Ohm
Implantable Lead Implant Date: 20140129
Implantable Lead Implant Date: 20140129
Implantable Lead Location: 753859
Implantable Lead Location: 753860
Implantable Lead Model: 180
Implantable Lead Model: 5076
Implantable Lead Serial Number: 310373
Implantable Pulse Generator Implant Date: 20140129
Lead Channel Impedance Value: 418 Ohm
Lead Channel Impedance Value: 475 Ohm
Lead Channel Pacing Threshold Amplitude: 0.5 V
Lead Channel Pacing Threshold Amplitude: 0.625 V
Lead Channel Pacing Threshold Pulse Width: 0.4 ms
Lead Channel Pacing Threshold Pulse Width: 0.4 ms
Lead Channel Sensing Intrinsic Amplitude: 0.5 mV
Lead Channel Sensing Intrinsic Amplitude: 0.5 mV
Lead Channel Sensing Intrinsic Amplitude: 7.375 mV
Lead Channel Sensing Intrinsic Amplitude: 7.375 mV
Lead Channel Setting Pacing Amplitude: 2 V
Lead Channel Setting Pacing Amplitude: 2.5 V
Lead Channel Setting Pacing Pulse Width: 0.4 ms
Lead Channel Setting Sensing Sensitivity: 0.3 mV

## 2019-01-23 NOTE — Progress Notes (Signed)
Remote ICD transmission.   

## 2019-01-26 ENCOUNTER — Ambulatory Visit (INDEPENDENT_AMBULATORY_CARE_PROVIDER_SITE_OTHER): Payer: Medicare Other | Admitting: *Deleted

## 2019-01-26 DIAGNOSIS — I472 Ventricular tachycardia: Secondary | ICD-10-CM | POA: Diagnosis not present

## 2019-01-26 DIAGNOSIS — I4729 Other ventricular tachycardia: Secondary | ICD-10-CM

## 2019-01-26 LAB — CUP PACEART REMOTE DEVICE CHECK
Battery Voltage: 2.64 V
Brady Statistic AP VP Percent: 0.03 %
Brady Statistic AP VS Percent: 29.85 %
Brady Statistic AS VP Percent: 0.03 %
Brady Statistic AS VS Percent: 70.09 %
Brady Statistic RA Percent Paced: 28.85 %
Brady Statistic RV Percent Paced: 0.07 %
Date Time Interrogation Session: 20201221033526
HighPow Impedance: 646 Ohm
HighPow Impedance: 90 Ohm
Implantable Lead Implant Date: 20140129
Implantable Lead Implant Date: 20140129
Implantable Lead Location: 753859
Implantable Lead Location: 753860
Implantable Lead Model: 180
Implantable Lead Model: 5076
Implantable Lead Serial Number: 310373
Implantable Pulse Generator Implant Date: 20140129
Lead Channel Impedance Value: 399 Ohm
Lead Channel Impedance Value: 551 Ohm
Lead Channel Pacing Threshold Amplitude: 0.375 V
Lead Channel Pacing Threshold Amplitude: 0.5 V
Lead Channel Pacing Threshold Pulse Width: 0.4 ms
Lead Channel Pacing Threshold Pulse Width: 0.4 ms
Lead Channel Sensing Intrinsic Amplitude: 1.375 mV
Lead Channel Sensing Intrinsic Amplitude: 1.375 mV
Lead Channel Sensing Intrinsic Amplitude: 7.875 mV
Lead Channel Sensing Intrinsic Amplitude: 7.875 mV
Lead Channel Setting Pacing Amplitude: 2 V
Lead Channel Setting Pacing Amplitude: 2.5 V
Lead Channel Setting Pacing Pulse Width: 0.4 ms
Lead Channel Setting Sensing Sensitivity: 0.3 mV

## 2019-02-26 ENCOUNTER — Ambulatory Visit (INDEPENDENT_AMBULATORY_CARE_PROVIDER_SITE_OTHER): Payer: Medicare HMO | Admitting: *Deleted

## 2019-02-26 DIAGNOSIS — I472 Ventricular tachycardia: Secondary | ICD-10-CM

## 2019-02-26 DIAGNOSIS — I4729 Other ventricular tachycardia: Secondary | ICD-10-CM

## 2019-02-26 LAB — CUP PACEART REMOTE DEVICE CHECK
Battery Voltage: 2.64 V
Brady Statistic AP VP Percent: 0.07 %
Brady Statistic AP VS Percent: 40.1 %
Brady Statistic AS VP Percent: 0.03 %
Brady Statistic AS VS Percent: 59.79 %
Brady Statistic RA Percent Paced: 37.29 %
Brady Statistic RV Percent Paced: 0.1 %
Date Time Interrogation Session: 20210121031705
HighPow Impedance: 475 Ohm
HighPow Impedance: 82 Ohm
Implantable Lead Implant Date: 20140129
Implantable Lead Implant Date: 20140129
Implantable Lead Location: 753859
Implantable Lead Location: 753860
Implantable Lead Model: 180
Implantable Lead Model: 5076
Implantable Lead Serial Number: 310373
Implantable Pulse Generator Implant Date: 20140129
Lead Channel Impedance Value: 399 Ohm
Lead Channel Impedance Value: 513 Ohm
Lead Channel Pacing Threshold Amplitude: 0.375 V
Lead Channel Pacing Threshold Amplitude: 0.625 V
Lead Channel Pacing Threshold Pulse Width: 0.4 ms
Lead Channel Pacing Threshold Pulse Width: 0.4 ms
Lead Channel Sensing Intrinsic Amplitude: 1.25 mV
Lead Channel Sensing Intrinsic Amplitude: 1.25 mV
Lead Channel Sensing Intrinsic Amplitude: 7.25 mV
Lead Channel Sensing Intrinsic Amplitude: 7.25 mV
Lead Channel Setting Pacing Amplitude: 2 V
Lead Channel Setting Pacing Amplitude: 2.5 V
Lead Channel Setting Pacing Pulse Width: 0.4 ms
Lead Channel Setting Sensing Sensitivity: 0.3 mV

## 2019-03-03 DIAGNOSIS — F331 Major depressive disorder, recurrent, moderate: Secondary | ICD-10-CM | POA: Diagnosis not present

## 2019-03-03 DIAGNOSIS — Z853 Personal history of malignant neoplasm of breast: Secondary | ICD-10-CM | POA: Diagnosis not present

## 2019-03-03 DIAGNOSIS — I5022 Chronic systolic (congestive) heart failure: Secondary | ICD-10-CM | POA: Diagnosis not present

## 2019-03-03 DIAGNOSIS — Z95810 Presence of automatic (implantable) cardiac defibrillator: Secondary | ICD-10-CM | POA: Diagnosis not present

## 2019-03-03 DIAGNOSIS — I4581 Long QT syndrome: Secondary | ICD-10-CM | POA: Diagnosis not present

## 2019-03-03 DIAGNOSIS — F419 Anxiety disorder, unspecified: Secondary | ICD-10-CM | POA: Diagnosis not present

## 2019-03-03 DIAGNOSIS — Z20828 Contact with and (suspected) exposure to other viral communicable diseases: Secondary | ICD-10-CM | POA: Diagnosis not present

## 2019-03-03 DIAGNOSIS — J989 Respiratory disorder, unspecified: Secondary | ICD-10-CM | POA: Diagnosis not present

## 2019-03-30 ENCOUNTER — Ambulatory Visit (INDEPENDENT_AMBULATORY_CARE_PROVIDER_SITE_OTHER): Payer: Medicare HMO | Admitting: *Deleted

## 2019-03-30 DIAGNOSIS — I472 Ventricular tachycardia: Secondary | ICD-10-CM

## 2019-03-30 DIAGNOSIS — I4729 Other ventricular tachycardia: Secondary | ICD-10-CM

## 2019-03-30 LAB — CUP PACEART REMOTE DEVICE CHECK
Battery Voltage: 2.64 V
Brady Statistic AP VP Percent: 0.07 %
Brady Statistic AP VS Percent: 39.22 %
Brady Statistic AS VP Percent: 0.05 %
Brady Statistic AS VS Percent: 60.65 %
Brady Statistic RA Percent Paced: 36.28 %
Brady Statistic RV Percent Paced: 0.12 %
Date Time Interrogation Session: 20210222022825
HighPow Impedance: 532 Ohm
HighPow Impedance: 83 Ohm
Implantable Lead Implant Date: 20140129
Implantable Lead Implant Date: 20140129
Implantable Lead Location: 753859
Implantable Lead Location: 753860
Implantable Lead Model: 180
Implantable Lead Model: 5076
Implantable Lead Serial Number: 310373
Implantable Pulse Generator Implant Date: 20140129
Lead Channel Impedance Value: 418 Ohm
Lead Channel Impedance Value: 551 Ohm
Lead Channel Pacing Threshold Amplitude: 0.5 V
Lead Channel Pacing Threshold Amplitude: 0.5 V
Lead Channel Pacing Threshold Pulse Width: 0.4 ms
Lead Channel Pacing Threshold Pulse Width: 0.4 ms
Lead Channel Sensing Intrinsic Amplitude: 1.75 mV
Lead Channel Sensing Intrinsic Amplitude: 1.75 mV
Lead Channel Sensing Intrinsic Amplitude: 9.125 mV
Lead Channel Sensing Intrinsic Amplitude: 9.125 mV
Lead Channel Setting Pacing Amplitude: 2 V
Lead Channel Setting Pacing Amplitude: 2.5 V
Lead Channel Setting Pacing Pulse Width: 0.4 ms
Lead Channel Setting Sensing Sensitivity: 0.3 mV

## 2019-03-30 NOTE — Progress Notes (Signed)
ICD Remote  

## 2019-04-20 ENCOUNTER — Encounter: Payer: Medicare Other | Admitting: Internal Medicine

## 2019-04-29 LAB — CUP PACEART REMOTE DEVICE CHECK
Battery Voltage: 2.63 V
Brady Statistic AP VP Percent: 0.09 %
Brady Statistic AP VS Percent: 35.94 %
Brady Statistic AS VP Percent: 0.06 %
Brady Statistic AS VS Percent: 63.9 %
Brady Statistic RA Percent Paced: 32.59 %
Brady Statistic RV Percent Paced: 0.15 %
Date Time Interrogation Session: 20210323120733
HighPow Impedance: 513 Ohm
HighPow Impedance: 82 Ohm
Implantable Lead Implant Date: 20140129
Implantable Lead Implant Date: 20140129
Implantable Lead Location: 753859
Implantable Lead Location: 753860
Implantable Lead Model: 180
Implantable Lead Model: 5076
Implantable Lead Serial Number: 310373
Implantable Pulse Generator Implant Date: 20140129
Lead Channel Impedance Value: 456 Ohm
Lead Channel Impedance Value: 532 Ohm
Lead Channel Pacing Threshold Amplitude: 0.5 V
Lead Channel Pacing Threshold Amplitude: 0.5 V
Lead Channel Pacing Threshold Pulse Width: 0.4 ms
Lead Channel Pacing Threshold Pulse Width: 0.4 ms
Lead Channel Sensing Intrinsic Amplitude: 1.625 mV
Lead Channel Sensing Intrinsic Amplitude: 1.625 mV
Lead Channel Sensing Intrinsic Amplitude: 6.75 mV
Lead Channel Sensing Intrinsic Amplitude: 6.75 mV
Lead Channel Setting Pacing Amplitude: 2 V
Lead Channel Setting Pacing Amplitude: 2.5 V
Lead Channel Setting Pacing Pulse Width: 0.4 ms
Lead Channel Setting Sensing Sensitivity: 0.3 mV

## 2019-04-30 ENCOUNTER — Ambulatory Visit (INDEPENDENT_AMBULATORY_CARE_PROVIDER_SITE_OTHER): Payer: Medicare HMO | Admitting: *Deleted

## 2019-04-30 DIAGNOSIS — I4729 Other ventricular tachycardia: Secondary | ICD-10-CM

## 2019-04-30 DIAGNOSIS — I472 Ventricular tachycardia: Secondary | ICD-10-CM

## 2019-05-17 DIAGNOSIS — I428 Other cardiomyopathies: Secondary | ICD-10-CM | POA: Insufficient documentation

## 2019-05-17 DIAGNOSIS — Z95810 Presence of automatic (implantable) cardiac defibrillator: Secondary | ICD-10-CM | POA: Insufficient documentation

## 2019-05-18 ENCOUNTER — Other Ambulatory Visit: Payer: Self-pay

## 2019-05-18 ENCOUNTER — Ambulatory Visit: Payer: Medicare HMO | Admitting: Internal Medicine

## 2019-05-18 ENCOUNTER — Encounter: Payer: Self-pay | Admitting: Internal Medicine

## 2019-05-18 VITALS — BP 122/86 | HR 63 | Ht 67.0 in | Wt 232.0 lb

## 2019-05-18 DIAGNOSIS — I428 Other cardiomyopathies: Secondary | ICD-10-CM

## 2019-05-18 DIAGNOSIS — I4729 Other ventricular tachycardia: Secondary | ICD-10-CM

## 2019-05-18 DIAGNOSIS — Z95810 Presence of automatic (implantable) cardiac defibrillator: Secondary | ICD-10-CM

## 2019-05-18 DIAGNOSIS — I4581 Long QT syndrome: Secondary | ICD-10-CM | POA: Diagnosis not present

## 2019-05-18 DIAGNOSIS — I472 Ventricular tachycardia: Secondary | ICD-10-CM | POA: Diagnosis not present

## 2019-05-18 NOTE — Progress Notes (Signed)
Patient Care Team: Street, [name], MD as PCP - General   HPI  Michelle Castro is a 69 y.o. female Seen following the history of atrial fibrillation modest nonischemic cardiomyopathy  Has hx of  polymorphic ventricular tachycardia. QT prolongation was noted with normal electrolytes. Sertraline was thought to be potentially contributing; polymorphic VT persisted despite its discontinuation  she underwent ICD implantation  Gene testing was apparently started but it was done through her sister;  result were negative  Hx of ICD shock associated of polymorphic ventricular tachycardia/fibrillation which occurred while at rest. She had run out of her propranolol prior to this..  She also was, upon interrogation, noted to have an episode of self terminating polymorphic VT 2/16.      Date Cr K Hgb  2/20 0.98 4.9    11/20  1.1 4.7    CTA Ca Score 0  12-31-22    Her daughter died of breast cancer 01/05/2018.  There are 2 children, [name] and [name].  Husband's name is [name].   theyu are all still struggling  The patient denies chest pain, shortness of breath, nocturnal dyspnea, orthopnea or peripheral edema.  There have been no palpitations, lightheadedness or syncope.  This measures (OB/GYN here at this time with no  7/17 Echo EF 40-45%   Past Medical History:  Diagnosis Date  . 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 Arthritis   . Breast cancer ([place]) 1986   s/p :L mastectomy  . Dementia ([place])   . Depression/ anxiety   . Hypertension   . Long Q-T syndrome    gene testing pending  . PAF (paroxysmal atrial fibrillation) ([place])    not well documented  . PFO (patent foramen ovale)   . Syncope   . Ventricular tachycardia, polymorphic ([place])    long-short    Past Surgical History:  Procedure Laterality Date  . ABDOMINAL HYSTERECTOMY    . AUGMENTATION MAMMAPLASTY    . BREAST BIOPSY Right   . BREAST SURGERY     mastectomy  . IMPLANTABLE  CARDIOVERTER DEFIBRILLATOR IMPLANT N/A 03/05/2012   MDT dual chamber ICD implanted for secondary prevention for Long QT syndrome  . MASTECTOMY Left   . TOTAL KNEE ARTHROPLASTY     bilateral    Current Outpatient Medications  Medication Sig Dispense Refill  . LORazepam (ATIVAN) 1 MG tablet Take 0.5-1 tablets by mouth at bedtime as needed. May take during the day as well    . Multiple Vitamin (MULTIVITAMIN WITH MINERALS) TABS Take 1 tablet by mouth daily.    . propranolol (INDERAL) 10 MG tablet Take 1 tablet (10 mg total) by mouth every 6 (six) hours as needed. 30 tablet 0  . propranolol ER (INDERAL LA) 120 MG 24 hr capsule TAKE TWO CAPSULES (240 MG) IN THE MORNING AND ONE CAPSULE (120 MG) BY MOUTH EVERY EVENING 270 capsule 1  . spironolactone (ALDACTONE) 25 MG tablet TAKE 1/2 TABLET BY MOUTH EVERY DAY 45 tablet 1  . venlafaxine XR (EFFEXOR-XR) 37.5 MG 24 hr capsule Take 37.5 mg by mouth daily with breakfast.     No current facility-administered medications for this visit.    Allergies  Allergen Reactions  . Codeine Nausea Only    Review of Systems negative except from HPI and PMH  Physical Exam BP 122/86   Pulse 63   Ht 5\' 7"  (1.702 m)   Wt 232 lb (105.2 kg)  SpO2 96%   BMI 36.34 kg/m  Well developed and well nourished in no acute distress HENT normal Neck supple with JVP-flat Clear Device pocket well healed; without hematoma or erythema.  There is no tethering  Regular rate and rhythm, no   murmur Abd-soft with active BS No Clubbing cyanosis  edema Skin-warm and dry A & Oriented  Grossly normal sensory and motor function  ECG sinus at 63 Interval 20/14/44 Right bundle branch block left axis deviation  Assessment and  Plan  Long QT syndrome-probable  Ventricular tachycardia-nonsustained-polymorphic  ICD implantation-Medtronic device nearly at ERI   Nonischemic cardiomyopathy  Psychosocial Stress  Right bundle branch block left axis deviation     Intercurrent nonsustained polymorphic VT  Electrolytes are normal  Calcium score was 0 making ischemia unliKELY  Device is approaching ERI.  Discussed the need for generator replacement.  Family is still grieving the loss of the daughter

## 2019-05-18 NOTE — Patient Instructions (Addendum)

## 2019-05-27 LAB — CUP PACEART INCLINIC DEVICE CHECK
Battery Voltage: 2.62 V
Brady Statistic RA Percent Paced: 32.1 %
Brady Statistic RV Percent Paced: 0.1 % — CL
Date Time Interrogation Session: 20210412170327
HighPow Impedance: 83 Ohm
Implantable Lead Implant Date: 20140129
Implantable Lead Implant Date: 20140129
Implantable Lead Location: 753859
Implantable Lead Location: 753860
Implantable Lead Model: 180
Implantable Lead Model: 5076
Implantable Lead Serial Number: 310373
Implantable Pulse Generator Implant Date: 20140129
Lead Channel Impedance Value: 418 Ohm
Lead Channel Impedance Value: 513 Ohm
Lead Channel Pacing Threshold Amplitude: 0.5 V
Lead Channel Pacing Threshold Amplitude: 0.5 V
Lead Channel Pacing Threshold Pulse Width: 0.4 ms
Lead Channel Pacing Threshold Pulse Width: 0.4 ms
Lead Channel Sensing Intrinsic Amplitude: 2.4 mV
Lead Channel Sensing Intrinsic Amplitude: 7 mV
Lead Channel Setting Pacing Amplitude: 2 V
Lead Channel Setting Pacing Amplitude: 2.5 V
Lead Channel Setting Pacing Pulse Width: 0.4 ms
Lead Channel Setting Sensing Sensitivity: 0.3 mV

## 2019-06-01 ENCOUNTER — Ambulatory Visit (INDEPENDENT_AMBULATORY_CARE_PROVIDER_SITE_OTHER): Payer: Medicare HMO | Admitting: *Deleted

## 2019-06-01 DIAGNOSIS — I4729 Other ventricular tachycardia: Secondary | ICD-10-CM

## 2019-06-01 DIAGNOSIS — I472 Ventricular tachycardia: Secondary | ICD-10-CM

## 2019-06-01 LAB — CUP PACEART REMOTE DEVICE CHECK
Battery Voltage: 2.62 V
Brady Statistic AP VP Percent: 0.14 %
Brady Statistic AP VS Percent: 40.85 %
Brady Statistic AS VP Percent: 0.05 %
Brady Statistic AS VS Percent: 58.96 %
Brady Statistic RA Percent Paced: 36.87 %
Brady Statistic RV Percent Paced: 0.21 %
Date Time Interrogation Session: 20210426033625
HighPow Impedance: 513 Ohm
HighPow Impedance: 94 Ohm
Implantable Lead Implant Date: 20140129
Implantable Lead Implant Date: 20140129
Implantable Lead Location: 753859
Implantable Lead Location: 753860
Implantable Lead Model: 180
Implantable Lead Model: 5076
Implantable Lead Serial Number: 310373
Implantable Pulse Generator Implant Date: 20140129
Lead Channel Impedance Value: 399 Ohm
Lead Channel Impedance Value: 532 Ohm
Lead Channel Pacing Threshold Amplitude: 0.5 V
Lead Channel Pacing Threshold Amplitude: 0.5 V
Lead Channel Pacing Threshold Pulse Width: 0.4 ms
Lead Channel Pacing Threshold Pulse Width: 0.4 ms
Lead Channel Sensing Intrinsic Amplitude: 1.25 mV
Lead Channel Sensing Intrinsic Amplitude: 1.25 mV
Lead Channel Sensing Intrinsic Amplitude: 7.25 mV
Lead Channel Sensing Intrinsic Amplitude: 7.25 mV
Lead Channel Setting Pacing Amplitude: 2 V
Lead Channel Setting Pacing Amplitude: 2.5 V
Lead Channel Setting Pacing Pulse Width: 0.4 ms
Lead Channel Setting Sensing Sensitivity: 0.3 mV

## 2019-06-30 ENCOUNTER — Other Ambulatory Visit: Payer: Self-pay | Admitting: Nurse Practitioner

## 2019-07-01 ENCOUNTER — Ambulatory Visit (INDEPENDENT_AMBULATORY_CARE_PROVIDER_SITE_OTHER): Payer: Medicare HMO | Admitting: *Deleted

## 2019-07-01 DIAGNOSIS — I428 Other cardiomyopathies: Secondary | ICD-10-CM

## 2019-07-01 DIAGNOSIS — I4581 Long QT syndrome: Secondary | ICD-10-CM

## 2019-07-01 LAB — CUP PACEART REMOTE DEVICE CHECK
Battery Voltage: 2.62 V
Brady Statistic AP VP Percent: 0.03 %
Brady Statistic AP VS Percent: 43.31 %
Brady Statistic AS VP Percent: 0.04 %
Brady Statistic AS VS Percent: 56.61 %
Brady Statistic RA Percent Paced: 38.78 %
Brady Statistic RV Percent Paced: 0.08 %
Date Time Interrogation Session: 20210526001706
HighPow Impedance: 513 Ohm
HighPow Impedance: 85 Ohm
Implantable Lead Implant Date: 20140129
Implantable Lead Implant Date: 20140129
Implantable Lead Location: 753859
Implantable Lead Location: 753860
Implantable Lead Model: 180
Implantable Lead Model: 5076
Implantable Lead Serial Number: 310373
Implantable Pulse Generator Implant Date: 20140129
Lead Channel Impedance Value: 418 Ohm
Lead Channel Impedance Value: 532 Ohm
Lead Channel Pacing Threshold Amplitude: 0.5 V
Lead Channel Pacing Threshold Amplitude: 0.625 V
Lead Channel Pacing Threshold Pulse Width: 0.4 ms
Lead Channel Pacing Threshold Pulse Width: 0.4 ms
Lead Channel Sensing Intrinsic Amplitude: 0.875 mV
Lead Channel Sensing Intrinsic Amplitude: 0.875 mV
Lead Channel Sensing Intrinsic Amplitude: 7 mV
Lead Channel Sensing Intrinsic Amplitude: 7 mV
Lead Channel Setting Pacing Amplitude: 2 V
Lead Channel Setting Pacing Amplitude: 2.5 V
Lead Channel Setting Pacing Pulse Width: 0.4 ms
Lead Channel Setting Sensing Sensitivity: 0.3 mV

## 2019-07-01 NOTE — Progress Notes (Signed)
Remote ICD transmission.   

## 2019-07-31 ENCOUNTER — Ambulatory Visit (INDEPENDENT_AMBULATORY_CARE_PROVIDER_SITE_OTHER): Payer: Medicare HMO | Admitting: *Deleted

## 2019-07-31 DIAGNOSIS — I472 Ventricular tachycardia: Secondary | ICD-10-CM

## 2019-07-31 DIAGNOSIS — I4729 Other ventricular tachycardia: Secondary | ICD-10-CM

## 2019-07-31 LAB — CUP PACEART REMOTE DEVICE CHECK
Battery Voltage: 2.61 V
Brady Statistic AP VP Percent: 0.05 %
Brady Statistic AP VS Percent: 36.22 %
Brady Statistic AS VP Percent: 0.04 %
Brady Statistic AS VS Percent: 63.7 %
Brady Statistic RA Percent Paced: 33.67 %
Brady Statistic RV Percent Paced: 0.08 %
Date Time Interrogation Session: 20210625043825
HighPow Impedance: 513 Ohm
HighPow Impedance: 80 Ohm
Implantable Lead Implant Date: 20140129
Implantable Lead Implant Date: 20140129
Implantable Lead Location: 753859
Implantable Lead Location: 753860
Implantable Lead Model: 180
Implantable Lead Model: 5076
Implantable Lead Serial Number: 310373
Implantable Pulse Generator Implant Date: 20140129
Lead Channel Impedance Value: 418 Ohm
Lead Channel Impedance Value: 532 Ohm
Lead Channel Pacing Threshold Amplitude: 0.5 V
Lead Channel Pacing Threshold Amplitude: 0.625 V
Lead Channel Pacing Threshold Pulse Width: 0.4 ms
Lead Channel Pacing Threshold Pulse Width: 0.4 ms
Lead Channel Sensing Intrinsic Amplitude: 1.5 mV
Lead Channel Sensing Intrinsic Amplitude: 1.5 mV
Lead Channel Sensing Intrinsic Amplitude: 7.5 mV
Lead Channel Sensing Intrinsic Amplitude: 7.5 mV
Lead Channel Setting Pacing Amplitude: 2 V
Lead Channel Setting Pacing Amplitude: 2.5 V
Lead Channel Setting Pacing Pulse Width: 0.4 ms
Lead Channel Setting Sensing Sensitivity: 0.3 mV

## 2019-08-03 ENCOUNTER — Ambulatory Visit: Payer: Medicare Other

## 2019-08-03 LAB — CUP PACEART REMOTE DEVICE CHECK
Battery Voltage: 2.61 V
Brady Statistic AP VP Percent: 0.05 %
Brady Statistic AP VS Percent: 41.15 %
Brady Statistic AS VP Percent: 0.03 %
Brady Statistic AS VS Percent: 58.77 %
Brady Statistic RA Percent Paced: 38.54 %
Brady Statistic RV Percent Paced: 0.08 %
Date Time Interrogation Session: 20210628033523
HighPow Impedance: 513 Ohm
HighPow Impedance: 75 Ohm
Implantable Lead Implant Date: 20140129
Implantable Lead Implant Date: 20140129
Implantable Lead Location: 753859
Implantable Lead Location: 753860
Implantable Lead Model: 180
Implantable Lead Model: 5076
Implantable Lead Serial Number: 310373
Implantable Pulse Generator Implant Date: 20140129
Lead Channel Impedance Value: 399 Ohm
Lead Channel Impedance Value: 475 Ohm
Lead Channel Pacing Threshold Amplitude: 0.5 V
Lead Channel Pacing Threshold Amplitude: 0.625 V
Lead Channel Pacing Threshold Pulse Width: 0.4 ms
Lead Channel Pacing Threshold Pulse Width: 0.4 ms
Lead Channel Sensing Intrinsic Amplitude: 1 mV
Lead Channel Sensing Intrinsic Amplitude: 1 mV
Lead Channel Sensing Intrinsic Amplitude: 5.875 mV
Lead Channel Sensing Intrinsic Amplitude: 5.875 mV
Lead Channel Setting Pacing Amplitude: 2 V
Lead Channel Setting Pacing Amplitude: 2.5 V
Lead Channel Setting Pacing Pulse Width: 0.4 ms
Lead Channel Setting Sensing Sensitivity: 0.3 mV

## 2019-08-03 NOTE — Progress Notes (Signed)
Remote ICD transmission.   

## 2019-08-12 ENCOUNTER — Telehealth: Payer: Self-pay | Admitting: *Deleted

## 2019-08-12 NOTE — Telephone Encounter (Signed)
RV defib impedance alert received for defib impedance of 101 ohms (alert programmed at 100 ohms). Trend overall stable with only one out of range measurement on 08/11/19 at 15:00. Current measurement 86 ohms. Battery also nearing RRT (2.63V), voltage currently measuring 2.61V.  Spoke with pt. She denies hearing ICD alert tone. Explained findings. Pt agreeable to DC appointment on 08/13/19 at 11:00am for manual testing and RV defib impedance alert adjustment. Advised to disregard tone if she hears it prior to this appointment. Pt verbalizes understanding and agreement with plan.

## 2019-08-13 ENCOUNTER — Ambulatory Visit (INDEPENDENT_AMBULATORY_CARE_PROVIDER_SITE_OTHER): Payer: Medicare HMO | Admitting: Emergency Medicine

## 2019-08-13 ENCOUNTER — Other Ambulatory Visit: Payer: Self-pay

## 2019-08-13 DIAGNOSIS — I472 Ventricular tachycardia: Secondary | ICD-10-CM | POA: Diagnosis not present

## 2019-08-13 DIAGNOSIS — I4729 Other ventricular tachycardia: Secondary | ICD-10-CM

## 2019-08-13 LAB — CUP PACEART INCLINIC DEVICE CHECK
Battery Voltage: 2.6 V
Brady Statistic AP VP Percent: 0.11 %
Brady Statistic AP VS Percent: 43.17 %
Brady Statistic AS VP Percent: 0.04 %
Brady Statistic AS VS Percent: 56.67 %
Brady Statistic RA Percent Paced: 38.85 %
Brady Statistic RV Percent Paced: 0.16 %
Date Time Interrogation Session: 20210708170940
HighPow Impedance: 475 Ohm
HighPow Impedance: 83 Ohm
Implantable Lead Implant Date: 20140129
Implantable Lead Implant Date: 20140129
Implantable Lead Location: 753859
Implantable Lead Location: 753860
Implantable Lead Model: 180
Implantable Lead Model: 5076
Implantable Lead Serial Number: 310373
Implantable Pulse Generator Implant Date: 20140129
Lead Channel Impedance Value: 456 Ohm
Lead Channel Impedance Value: 513 Ohm
Lead Channel Pacing Threshold Amplitude: 0.5 V
Lead Channel Pacing Threshold Amplitude: 0.625 V
Lead Channel Pacing Threshold Pulse Width: 0.4 ms
Lead Channel Pacing Threshold Pulse Width: 0.4 ms
Lead Channel Sensing Intrinsic Amplitude: 1.875 mV
Lead Channel Sensing Intrinsic Amplitude: 2.75 mV
Lead Channel Sensing Intrinsic Amplitude: 6.875 mV
Lead Channel Sensing Intrinsic Amplitude: 7.25 mV
Lead Channel Setting Pacing Amplitude: 2 V
Lead Channel Setting Pacing Amplitude: 2.5 V
Lead Channel Setting Pacing Pulse Width: 0.4 ms
Lead Channel Setting Sensing Sensitivity: 0.3 mV

## 2019-08-13 NOTE — Progress Notes (Signed)
ICD check in clinic with industry.  In-clinic testing being done due to alert for high HV impedence recorded on 08/10/19.  Normal device function. Thresholds and sensing consistent with previous device measurements. AV and RV Impedance trends stable over time. No mode switches. No ventricular arrhythmias. Histogram distribution appropriate for patient and level of activity. Max RV defib lead impedence increased to 130ohms . Device programmed at appropriate safety margins. Device programmed to optimize intrinsic conduction. Battery Voltage 2.6v,  RRT has not be triggered yet. Pt enrolled in remote follow-up. Patient education completed including shock plan. Auditory/vibratory alert demonstrated.

## 2019-08-31 ENCOUNTER — Ambulatory Visit (INDEPENDENT_AMBULATORY_CARE_PROVIDER_SITE_OTHER): Payer: Medicare HMO | Admitting: *Deleted

## 2019-08-31 DIAGNOSIS — I4729 Other ventricular tachycardia: Secondary | ICD-10-CM

## 2019-08-31 DIAGNOSIS — I472 Ventricular tachycardia: Secondary | ICD-10-CM

## 2019-08-31 LAB — CUP PACEART REMOTE DEVICE CHECK
Battery Voltage: 2.6 V
Brady Statistic AP VP Percent: 0.03 %
Brady Statistic AP VS Percent: 36.31 %
Brady Statistic AS VP Percent: 0.03 %
Brady Statistic AS VS Percent: 63.64 %
Brady Statistic RA Percent Paced: 34.87 %
Brady Statistic RV Percent Paced: 0.05 %
Date Time Interrogation Session: 20210726043725
HighPow Impedance: 475 Ohm
HighPow Impedance: 78 Ohm
Implantable Lead Implant Date: 20140129
Implantable Lead Implant Date: 20140129
Implantable Lead Location: 753859
Implantable Lead Location: 753860
Implantable Lead Model: 180
Implantable Lead Model: 5076
Implantable Lead Serial Number: 310373
Implantable Pulse Generator Implant Date: 20140129
Lead Channel Impedance Value: 418 Ohm
Lead Channel Impedance Value: 475 Ohm
Lead Channel Pacing Threshold Amplitude: 0.5 V
Lead Channel Pacing Threshold Amplitude: 0.625 V
Lead Channel Pacing Threshold Pulse Width: 0.4 ms
Lead Channel Pacing Threshold Pulse Width: 0.4 ms
Lead Channel Sensing Intrinsic Amplitude: 1.25 mV
Lead Channel Sensing Intrinsic Amplitude: 1.25 mV
Lead Channel Sensing Intrinsic Amplitude: 6.375 mV
Lead Channel Sensing Intrinsic Amplitude: 6.375 mV
Lead Channel Setting Pacing Amplitude: 2 V
Lead Channel Setting Pacing Amplitude: 2.5 V
Lead Channel Setting Pacing Pulse Width: 0.4 ms
Lead Channel Setting Sensing Sensitivity: 0.3 mV

## 2019-09-01 NOTE — Progress Notes (Signed)
Remote ICD transmission.   

## 2019-09-03 ENCOUNTER — Telehealth: Payer: Self-pay | Admitting: Emergency Medicine

## 2019-09-03 DIAGNOSIS — I428 Other cardiomyopathies: Secondary | ICD-10-CM

## 2019-09-03 DIAGNOSIS — Z01812 Encounter for preprocedural laboratory examination: Secondary | ICD-10-CM

## 2019-09-03 NOTE — Telephone Encounter (Signed)
LM on cell phone per DPR that battery at [facility]. Dr Caryl Comes discussed the gen change with her at her April appointment. Patient was notified that she will receive a call from Dr Olin Pia nurse  To schedule gen change and that there is no urgent need to do this immediately. Left device clinic # if she has questions.

## 2019-09-11 NOTE — Telephone Encounter (Signed)
Spoke with pt re: generator change.  Pt selected Wednesday September 23, 2019 as procedure date.  Pt advised once labs and procedure is scheduled will call back with instructions.  Pt verbalizes understanding and agrees with current plan.

## 2019-09-14 NOTE — Telephone Encounter (Signed)
Spoke with pt and reviewed Device Instruction Letter with pt.  See Letter for complete details.  Copy of letter put on pt's MyChart as well as hard copy, scrub instructions and surgical scrub all placed up front for pt to pick up Friday 09/18/2019 during lab appointment. Pt verbalizes understanding and agrees with current plan.

## 2019-09-17 DIAGNOSIS — H52223 Regular astigmatism, bilateral: Secondary | ICD-10-CM | POA: Diagnosis not present

## 2019-09-17 DIAGNOSIS — Z0100 Encounter for examination of eyes and vision without abnormal findings: Secondary | ICD-10-CM | POA: Diagnosis not present

## 2019-09-18 ENCOUNTER — Other Ambulatory Visit: Payer: Self-pay | Admitting: *Deleted

## 2019-09-18 ENCOUNTER — Other Ambulatory Visit: Payer: Medicare HMO | Admitting: *Deleted

## 2019-09-18 ENCOUNTER — Other Ambulatory Visit: Payer: Self-pay

## 2019-09-18 DIAGNOSIS — I428 Other cardiomyopathies: Secondary | ICD-10-CM | POA: Diagnosis not present

## 2019-09-18 DIAGNOSIS — Z01812 Encounter for preprocedural laboratory examination: Secondary | ICD-10-CM

## 2019-09-18 LAB — SPECIMEN STATUS REPORT

## 2019-09-18 LAB — BASIC METABOLIC PANEL
BUN/Creatinine Ratio: 16 (ref 12–28)
BUN: 17 mg/dL (ref 8–27)
CO2: 22 mmol/L (ref 20–29)
Calcium: 9.7 mg/dL (ref 8.7–10.3)
Chloride: 102 mmol/L (ref 96–106)
Creatinine, Ser: 1.04 mg/dL — ABNORMAL HIGH (ref 0.57–1.00)
GFR calc Af Amer: 62 mL/min/{1.73_m2} (ref >59)
GFR calc non Af Amer: 54 mL/min/{1.73_m2} — ABNORMAL LOW (ref >59)
Glucose: 76 mg/dL (ref 65–99)
Potassium: 4.4 mmol/L (ref 3.5–5.2)
Sodium: 138 mmol/L (ref 134–144)

## 2019-09-18 LAB — CBC
Hematocrit: 38.7 % (ref 34.0–46.6)
Hemoglobin: 13.1 g/dL (ref 11.1–15.9)
MCH: 29.5 pg (ref 26.6–33.0)
MCHC: 33.9 g/dL (ref 31.5–35.7)
MCV: 87 fL (ref 79–97)
Platelets: 293 10*3/uL (ref 150–450)
RBC: 4.44 x10E6/uL (ref 3.77–5.28)
RDW: 13.1 % (ref 11.7–15.4)
WBC: 9.3 10*3/uL (ref 3.4–10.8)

## 2019-09-19 ENCOUNTER — Inpatient Hospital Stay (HOSPITAL_COMMUNITY): Admission: RE | Admit: 2019-09-19 | Payer: Medicare HMO | Source: Ambulatory Visit

## 2019-09-21 ENCOUNTER — Telehealth: Payer: Self-pay | Admitting: Internal Medicine

## 2019-09-21 ENCOUNTER — Other Ambulatory Visit (HOSPITAL_COMMUNITY)
Admission: RE | Admit: 2019-09-21 | Discharge: 2019-09-21 | Disposition: A | Discharge status: Home / Self Care | Payer: Medicare HMO | Source: Ambulatory Visit | Attending: Internal Medicine | Admitting: Internal Medicine

## 2019-09-21 DIAGNOSIS — Z01812 Encounter for preprocedural laboratory examination: Secondary | ICD-10-CM | POA: Insufficient documentation

## 2019-09-21 DIAGNOSIS — Z20822 Contact with and (suspected) exposure to covid-19: Secondary | ICD-10-CM | POA: Insufficient documentation

## 2019-09-21 LAB — SARS CORONAVIRUS 2 (TAT 6-24 HRS): SARS Coronavirus 2: NEGATIVE

## 2019-09-21 NOTE — Telephone Encounter (Signed)
    Pt said she got her lab work last Friday for her upcoming procedure, she stopped buy to the office and picked up her paper work, on the papers it said her covid test is on Monday and when she look at it closer today its for a different pt. She missed her covid test last saturday and would like to r/s.

## 2019-09-21 NOTE — Telephone Encounter (Signed)
Spoke with pt who states she went for covid testing today 09/21/2019 for generator change scheduled for 09/23/2019.

## 2019-09-23 ENCOUNTER — Other Ambulatory Visit: Payer: Self-pay

## 2019-09-23 ENCOUNTER — Ambulatory Visit (HOSPITAL_COMMUNITY)
Admission: RE | Admit: 2019-09-23 | Discharge: 2019-09-23 | Disposition: A | Discharge status: Home / Self Care | Payer: Medicare HMO | Attending: Internal Medicine | Admitting: Internal Medicine

## 2019-09-23 ENCOUNTER — Encounter (HOSPITAL_COMMUNITY)
Admission: RE | Disposition: A | Discharge status: Home / Self Care | Payer: Medicare HMO | Source: Home / Self Care | Attending: Internal Medicine

## 2019-09-23 ENCOUNTER — Encounter (HOSPITAL_COMMUNITY): Payer: Self-pay | Admitting: Internal Medicine

## 2019-09-23 DIAGNOSIS — I480 Paroxysmal atrial fibrillation: Secondary | ICD-10-CM | POA: Insufficient documentation

## 2019-09-23 DIAGNOSIS — I472 Ventricular tachycardia: Secondary | ICD-10-CM | POA: Diagnosis not present

## 2019-09-23 DIAGNOSIS — Z9012 Acquired absence of left breast and nipple: Secondary | ICD-10-CM | POA: Insufficient documentation

## 2019-09-23 DIAGNOSIS — F0390 Unspecified dementia without behavioral disturbance: Secondary | ICD-10-CM | POA: Insufficient documentation

## 2019-09-23 DIAGNOSIS — Z853 Personal history of malignant neoplasm of breast: Secondary | ICD-10-CM | POA: Diagnosis not present

## 2019-09-23 DIAGNOSIS — I10 Essential (primary) hypertension: Secondary | ICD-10-CM | POA: Diagnosis not present

## 2019-09-23 DIAGNOSIS — F419 Anxiety disorder, unspecified: Secondary | ICD-10-CM | POA: Insufficient documentation

## 2019-09-23 DIAGNOSIS — F329 Major depressive disorder, single episode, unspecified: Secondary | ICD-10-CM | POA: Insufficient documentation

## 2019-09-23 DIAGNOSIS — Z885 Allergy status to narcotic agent status: Secondary | ICD-10-CM | POA: Insufficient documentation

## 2019-09-23 DIAGNOSIS — I428 Other cardiomyopathies: Secondary | ICD-10-CM | POA: Insufficient documentation

## 2019-09-23 DIAGNOSIS — Z4502 Encounter for adjustment and management of automatic implantable cardiac defibrillator: Secondary | ICD-10-CM | POA: Insufficient documentation

## 2019-09-23 DIAGNOSIS — Z791 Long term (current) use of non-steroidal anti-inflammatories (NSAID): Secondary | ICD-10-CM | POA: Diagnosis not present

## 2019-09-23 DIAGNOSIS — I452 Bifascicular block: Secondary | ICD-10-CM | POA: Diagnosis not present

## 2019-09-23 DIAGNOSIS — Q211 Atrial septal defect: Secondary | ICD-10-CM | POA: Diagnosis not present

## 2019-09-23 DIAGNOSIS — Z79899 Other long term (current) drug therapy: Secondary | ICD-10-CM | POA: Insufficient documentation

## 2019-09-23 HISTORY — PX: ICD GENERATOR CHANGEOUT: EP1231

## 2019-09-23 SURGERY — ICD GENERATOR CHANGEOUT

## 2019-09-23 MED ORDER — CEFAZOLIN SODIUM-DEXTROSE 2-4 GM/100ML-% IV SOLN
INTRAVENOUS | Status: AC
  Filled 2019-09-23: qty 100

## 2019-09-23 MED ORDER — LIDOCAINE HCL (PF) 1 % IJ SOLN
INTRAMUSCULAR | Status: DC | PRN
  Administered 2019-09-23: 60 mL

## 2019-09-23 MED ORDER — FENTANYL CITRATE (PF) 100 MCG/2ML IJ SOLN
INTRAMUSCULAR | Status: AC
  Filled 2019-09-23: qty 2

## 2019-09-23 MED ORDER — CEFAZOLIN SODIUM-DEXTROSE 2-4 GM/100ML-% IV SOLN
2.0000 g | INTRAVENOUS | Status: AC
Start: 2019-09-23 — End: 2019-09-23
  Administered 2019-09-23: 2 g via INTRAVENOUS

## 2019-09-23 MED ORDER — CHLORHEXIDINE GLUCONATE 4 % EX LIQD
4.0000 | Freq: Once | CUTANEOUS | Status: DC
Start: 2019-09-23 — End: 2019-09-23

## 2019-09-23 MED ORDER — LIDOCAINE HCL (PF) 1 % IJ SOLN
INTRAMUSCULAR | Status: AC
  Filled 2019-09-23: qty 60

## 2019-09-23 MED ORDER — ACETAMINOPHEN 325 MG PO TABS
325.0000 mg | ORAL_TABLET | ORAL | Status: DC | PRN
Start: 2019-09-23 — End: 2019-09-23

## 2019-09-23 MED ORDER — SODIUM CHLORIDE 0.9 % IV SOLN: INTRAVENOUS | Status: DC

## 2019-09-23 MED ORDER — SODIUM CHLORIDE 0.9 % IV SOLN
INTRAVENOUS | Status: AC
  Filled 2019-09-23: qty 2

## 2019-09-23 MED ORDER — MIDAZOLAM HCL 5 MG/5ML IJ SOLN
INTRAMUSCULAR | Status: DC | PRN
  Administered 2019-09-23: 2 mg via INTRAVENOUS
  Administered 2019-09-23 (×3): 1 mg via INTRAVENOUS

## 2019-09-23 MED ORDER — FENTANYL CITRATE (PF) 100 MCG/2ML IJ SOLN
INTRAMUSCULAR | Status: DC | PRN
  Administered 2019-09-23 (×4): 25 ug via INTRAVENOUS

## 2019-09-23 MED ORDER — MIDAZOLAM HCL 5 MG/5ML IJ SOLN
INTRAMUSCULAR | Status: AC
  Filled 2019-09-23: qty 5

## 2019-09-23 MED ORDER — SODIUM CHLORIDE 0.9 % IV SOLN
80.0000 mg | INTRAVENOUS | Status: AC
Start: 2019-09-23 — End: 2019-09-23
  Administered 2019-09-23: 80 mg

## 2019-09-23 SURGICAL SUPPLY — 8 items
CABLE SURGICAL S-101-97-12 (CABLE) ×3 IMPLANT
HEMOSTAT SURGICEL 2X4 FIBR (HEMOSTASIS) ×2 IMPLANT
ICD EVERA XT MRI DF1  DDMB1D1 (ICD Generator) ×3 IMPLANT
ICD EVERA XT MRI DF1 DDMB1D1 (ICD Generator) IMPLANT
PAD PRO RADIOLUCENT 2001M-C (PAD) ×3 IMPLANT
POUCH AIGIS-R ANTIBACT ICD (Mesh General) ×3 IMPLANT
POUCH AIGIS-R ANTIBACT ICD LRG (Mesh General) IMPLANT
TRAY PACEMAKER INSERTION (PACKS) ×3 IMPLANT

## 2019-09-23 NOTE — Interval H&P Note (Signed)
History and Physical Interval Note:  8/18/2021ICD Criteria  Current LVEF:40-45%. Within 12 months prior to implant: Yes   Heart failure history: Yes, Class II  Cardiomyopathy history: No.  Atrial Fibrillation/Atrial Flutter: No.  Ventricular tachycardia history: Yes, Hemodynamic instability present. VT Type: Sustained Ventricular Tachycardia - Polymorphic.  Cardiac arrest history: No.  History of syndromes with risk of sudden death: No.  Previous ICD: Yes, Reason for ICD:  Secondary prevention.  Current ICD indication: Secondary  PPM indication: No.  Class I or II Bradycardia indication present: No  Beta Blocker therapy for 3 or more months: Yes, prescribed.   Ace Inhibitor/ARB therapy for 3 or more months: No, medical reason.   I have seen Michelle Castro is a 69 y.o. femalepre-procedural  for consideration of ICD reimplant for secondary prevention of sudden death.  The patient's chart has been reviewed and they meet criteria for ICD implant.  I have had a thorough discussion with the patient reviewing options.  The patient and their family (if available) have had opportunities to ask questions and have them answered. The patient and I have decided together through the [place] [employer] to reimplant ICD at this time.  Risks, benefits, alternatives to ICD implantation were discussed in detail with the patient today. The patient  understands that the risks include but are not limited to bleeding, infection, pneumothorax, perforation, tamponade, vascular damage, renal failure, MI, stroke, death, inappropriate shocks, and lead dislodgement and   wishes to proceed.   8:45 AM  Michelle Castro  has presented today for surgery, with the diagnosis of ERI.  The various methods of treatment have been discussed with the patient and family. After consideration of risks, benefits and other options for treatment, the patient has consented to  Procedure(s): ICD [place] (N/A) as a surgical intervention.  The patient's history has been reviewed, patient examined, no change in status, stable for surgery.  I have reviewed the patient's chart and labs.  Questions were answered to the patient's satisfaction.     Virl Axe

## 2019-09-23 NOTE — Discharge Instructions (Signed)

## 2019-09-23 NOTE — H&P (Signed)
Patient Care Team: Street, Michelle Mt, MD as PCP - General   HPI  Michelle Castro is a 69 y.o. female admitted for ICD generator replacement.  Initial implant 2014 Device originally implanted for VT-Polymorphic initially attributed to sertraline but then recurred.  Appropriate therapy but none recently  The patient denies chest pain, shortness of breath, nocturnal dyspnea, orthopnea or peripheral edema.  There have been no palpitations, lightheadedness or syncope.      Date Cr K Hgb  2/20 0.98 4.9    11/20  1.1 4.7   8/21 1.04 4.4 13.1   CTA Ca Score 0  11/18  7/17 Echo EF 40-45%  Records and Results Reviewed   Past Medical History:  Diagnosis Date  . 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 Arthritis   . Breast cancer ([place]) 1986   s/p :L mastectomy  . Dementia ([place])   . Depression/ anxiety   . Hypertension   . Long Q-T syndrome    gene testing pending  . PAF (paroxysmal atrial fibrillation) ([place])    not well documented  . PFO (patent foramen ovale)   . Syncope   . Ventricular tachycardia, polymorphic ([place])    long-short    Past Surgical History:  Procedure Laterality Date  . ABDOMINAL HYSTERECTOMY    . AUGMENTATION MAMMAPLASTY    . BREAST BIOPSY Right   . BREAST SURGERY     mastectomy  . IMPLANTABLE CARDIOVERTER DEFIBRILLATOR IMPLANT N/A 03/05/2012   MDT dual chamber ICD implanted for secondary prevention for Long QT syndrome  . MASTECTOMY Left   . TOTAL KNEE ARTHROPLASTY     bilateral    No current facility-administered medications for this encounter.    Allergies  Allergen Reactions  . Codeine Nausea Only      Social History   Tobacco Use  . Smoking status: Never Smoker  . Smokeless tobacco: Never Used  Substance Use Topics  . Alcohol use: No  . Drug use: No     Family History  Problem Relation Age of Onset  . Cancer Father   . Hypertension Father   . Cancer Brother      Current Meds   Medication Sig  . b complex vitamins capsule Take 1 capsule by mouth daily.  Marland Kitchen dicyclomine (BENTYL) 20 MG tablet Take 20 mg by mouth 4 (four) times daily as needed (stomach pain).   . LORazepam (ATIVAN) 1 MG tablet Take 0.5-1 tablets by mouth at bedtime as needed for anxiety or sleep. May take during the day as well  . meloxicam (MOBIC) 7.5 MG tablet Take 7.5 mg by mouth daily.  . Multiple Vitamin (MULTIVITAMIN WITH MINERALS) TABS Take 1 tablet by mouth daily.  . propranolol ER (INDERAL LA) 120 MG 24 hr capsule TAKE TWO CAPSULES (240 MG) IN THE MORNING AND ONE CAPSULE (120 MG) BY MOUTH EVERY EVENING (Patient taking differently: Take 120-240 mg by mouth See admin instructions. Take two capsules (240 mg) in the morning and one capsule (120 mg) by mouth every evening)  . spironolactone (ALDACTONE) 25 MG tablet TAKE 1/2 TABLET BY MOUTH EVERY DAY (Patient taking differently: Take 12.5 mg by mouth daily. )  . venlafaxine XR (EFFEXOR-XR) 37.5 MG 24 hr capsule Take 37.5 mg by mouth daily with breakfast.     Review of Systems negative except from HPI and PMH  Physical Exam There were no vitals taken for this visit.  Well  developed and well nourished in no acute distress HENT normal E scleral and icterus clear Neck Supple JVP flat; carotids brisk and full Clear to ausculation Regular rate and rhythm, no murmurs gallops or rub Soft with active bowel sounds No clubbing cyanosis  Edema Alert and oriented, grossly normal motor and sensory function Skin Warm and Dry    Assessment and  Plan  Long QT syndrome-probable  Ventricular tachycardia-nonsustained-polymorphic  ICD implantation-Medtronic device nearly at ERI   Nonischemic cardiomyopathy  Psychosocial Stress  Right bundle branch block left axis deviation   We have reviewed the benefits and risks of generator replacement.  These include but are not limited to lead fracture and infection.  The patient understands, agrees and is  willing to proceed.

## 2019-09-23 NOTE — Progress Notes (Signed)
Discharge instructions reviewed with patient and family. Verbalized understanding. 

## 2019-10-13 ENCOUNTER — Ambulatory Visit: Payer: Medicare HMO

## 2019-10-22 ENCOUNTER — Ambulatory Visit (INDEPENDENT_AMBULATORY_CARE_PROVIDER_SITE_OTHER): Payer: Medicare HMO | Admitting: Emergency Medicine

## 2019-10-22 ENCOUNTER — Other Ambulatory Visit: Payer: Self-pay

## 2019-10-22 DIAGNOSIS — Z95810 Presence of automatic (implantable) cardiac defibrillator: Secondary | ICD-10-CM | POA: Diagnosis not present

## 2019-10-22 DIAGNOSIS — I428 Other cardiomyopathies: Secondary | ICD-10-CM

## 2019-10-22 LAB — CUP PACEART INCLINIC DEVICE CHECK
Battery Remaining Longevity: 126 mo
Brady Statistic RA Percent Paced: 25 %
Brady Statistic RV Percent Paced: 0.1 %
Date Time Interrogation Session: 20210916130947
Implantable Lead Implant Date: 20140129
Implantable Lead Implant Date: 20140129
Implantable Lead Location: 753859
Implantable Lead Location: 753860
Implantable Lead Model: 180
Implantable Lead Model: 5076
Implantable Lead Serial Number: 310373
Implantable Pulse Generator Implant Date: 20210818
Lead Channel Setting Pacing Amplitude: 2 V
Lead Channel Setting Pacing Amplitude: 3.5 V
Lead Channel Setting Pacing Pulse Width: 0.4 ms
Lead Channel Setting Sensing Sensitivity: 0.3 mV

## 2019-10-22 NOTE — Progress Notes (Signed)
Wound check appointment. Steri-strips removed. Wound without redness or edema. Incision edges approximated, wound well healed. Normal device function. Thresholds, sensing, and impedances consistent with implant measurements. Device programmed at chronic settings due to mature leads. Histogram distribution appropriate for patient and level of activity. No mode switches or ventricular arrhythmias noted. Patient educated about wound care and  shock plan. ROV with Dr Caryl Comes 01/12/20. Enrolled in remote monitoring and next remote transmission 01/07/20.

## 2019-10-27 DIAGNOSIS — M2550 Pain in unspecified joint: Secondary | ICD-10-CM | POA: Diagnosis not present

## 2019-10-27 DIAGNOSIS — M25511 Pain in right shoulder: Secondary | ICD-10-CM | POA: Diagnosis not present

## 2019-10-27 DIAGNOSIS — M19011 Primary osteoarthritis, right shoulder: Secondary | ICD-10-CM | POA: Diagnosis not present

## 2019-10-28 DIAGNOSIS — R05 Cough: Secondary | ICD-10-CM | POA: Diagnosis not present

## 2019-10-28 DIAGNOSIS — T7840XA Allergy, unspecified, initial encounter: Secondary | ICD-10-CM | POA: Diagnosis not present

## 2019-11-04 DIAGNOSIS — M79642 Pain in left hand: Secondary | ICD-10-CM | POA: Diagnosis not present

## 2019-11-04 DIAGNOSIS — M79641 Pain in right hand: Secondary | ICD-10-CM | POA: Diagnosis not present

## 2019-11-06 DIAGNOSIS — I5022 Chronic systolic (congestive) heart failure: Secondary | ICD-10-CM | POA: Diagnosis not present

## 2019-11-06 DIAGNOSIS — J189 Pneumonia, unspecified organism: Secondary | ICD-10-CM | POA: Diagnosis not present

## 2019-12-23 ENCOUNTER — Ambulatory Visit (INDEPENDENT_AMBULATORY_CARE_PROVIDER_SITE_OTHER): Payer: Medicare HMO

## 2019-12-23 DIAGNOSIS — I472 Ventricular tachycardia, unspecified: Secondary | ICD-10-CM

## 2019-12-23 LAB — CUP PACEART REMOTE DEVICE CHECK
Battery Remaining Longevity: 123 mo
Battery Voltage: 3.07 V
Brady Statistic AP VP Percent: 0.03 %
Brady Statistic AP VS Percent: 31.17 %
Brady Statistic AS VP Percent: 0.03 %
Brady Statistic AS VS Percent: 68.77 %
Brady Statistic RA Percent Paced: 30.24 %
Brady Statistic RV Percent Paced: 0.06 %
Date Time Interrogation Session: 20211117033524
HighPow Impedance: 66 Ohm
Implantable Lead Implant Date: 20140129
Implantable Lead Implant Date: 20140129
Implantable Lead Location: 753859
Implantable Lead Location: 753860
Implantable Lead Model: 180
Implantable Lead Model: 5076
Implantable Lead Serial Number: 310373
Implantable Pulse Generator Implant Date: 20210818
Lead Channel Impedance Value: 437 Ohm
Lead Channel Impedance Value: 456 Ohm
Lead Channel Impedance Value: 456 Ohm
Lead Channel Pacing Threshold Amplitude: 0.5 V
Lead Channel Pacing Threshold Amplitude: 0.5 V
Lead Channel Pacing Threshold Pulse Width: 0.4 ms
Lead Channel Pacing Threshold Pulse Width: 0.4 ms
Lead Channel Sensing Intrinsic Amplitude: 1 mV
Lead Channel Sensing Intrinsic Amplitude: 1 mV
Lead Channel Sensing Intrinsic Amplitude: 6.375 mV
Lead Channel Sensing Intrinsic Amplitude: 6.375 mV
Lead Channel Setting Pacing Amplitude: 2 V
Lead Channel Setting Pacing Amplitude: 3.5 V
Lead Channel Setting Pacing Pulse Width: 0.4 ms
Lead Channel Setting Sensing Sensitivity: 0.3 mV

## 2019-12-24 NOTE — Progress Notes (Signed)
Remote ICD transmission.   

## 2019-12-30 ENCOUNTER — Other Ambulatory Visit: Payer: Self-pay

## 2019-12-30 ENCOUNTER — Encounter: Payer: Self-pay | Admitting: Internal Medicine

## 2019-12-30 ENCOUNTER — Ambulatory Visit: Payer: Medicare HMO | Admitting: Internal Medicine

## 2019-12-30 VITALS — BP 126/74 | HR 62 | Ht 67.0 in | Wt 217.2 lb

## 2019-12-30 DIAGNOSIS — I428 Other cardiomyopathies: Secondary | ICD-10-CM | POA: Diagnosis not present

## 2019-12-30 DIAGNOSIS — I472 Ventricular tachycardia: Secondary | ICD-10-CM

## 2019-12-30 DIAGNOSIS — R072 Precordial pain: Secondary | ICD-10-CM

## 2019-12-30 DIAGNOSIS — Z95810 Presence of automatic (implantable) cardiac defibrillator: Secondary | ICD-10-CM

## 2019-12-30 DIAGNOSIS — I4729 Other ventricular tachycardia: Secondary | ICD-10-CM

## 2019-12-30 LAB — CUP PACEART INCLINIC DEVICE CHECK
Battery Remaining Longevity: 125 mo
Battery Voltage: 3.06 V
Brady Statistic AP VP Percent: 0.03 %
Brady Statistic AP VS Percent: 29.56 %
Brady Statistic AS VP Percent: 0.05 %
Brady Statistic AS VS Percent: 70.36 %
Brady Statistic RA Percent Paced: 28.71 %
Brady Statistic RV Percent Paced: 0.08 %
Date Time Interrogation Session: 20211124174048
HighPow Impedance: 78 Ohm
Implantable Lead Implant Date: 20140129
Implantable Lead Implant Date: 20140129
Implantable Lead Location: 753859
Implantable Lead Location: 753860
Implantable Lead Model: 180
Implantable Lead Model: 5076
Implantable Lead Serial Number: 310373
Implantable Pulse Generator Implant Date: 20210818
Lead Channel Impedance Value: 437 Ohm
Lead Channel Impedance Value: 456 Ohm
Lead Channel Impedance Value: 494 Ohm
Lead Channel Pacing Threshold Amplitude: 0.5 V
Lead Channel Pacing Threshold Amplitude: 0.5 V
Lead Channel Pacing Threshold Pulse Width: 0.4 ms
Lead Channel Pacing Threshold Pulse Width: 0.4 ms
Lead Channel Sensing Intrinsic Amplitude: 12.4 mV
Lead Channel Sensing Intrinsic Amplitude: 2.1 mV
Lead Channel Setting Pacing Amplitude: 2 V
Lead Channel Setting Pacing Amplitude: 2 V
Lead Channel Setting Pacing Pulse Width: 0.4 ms
Lead Channel Setting Sensing Sensitivity: 0.3 mV

## 2019-12-30 MED ORDER — METOPROLOL TARTRATE 25 MG PO TABS
ORAL_TABLET | ORAL | 0 refills | Status: DC
Start: 2019-12-30 — End: 2021-03-29

## 2019-12-30 NOTE — Progress Notes (Signed)
Patient Care Team: Street, [name], MD as PCP - General   HPI  Michelle Castro is a 69 y.o. female Seen following the history of atrial fibrillation modest nonischemic cardiomyopathy  Has hx of  polymorphic ventricular tachycardia. QT prolongation was noted with normal electrolytes. Sertraline was thought to be potentially contributing; polymorphic VT persisted despite its discontinuation  she underwent ICD implantation and generator replacement 8/21  Gene testing was apparently started but it was done through her sister;  result were negative  Hx of ICD shock associated of polymorphic ventricular tachycardia/fibrillation which occurred while at rest. She had run out of her propranolol prior to this..  She also was, upon interrogation, noted to have an episode of self terminating polymorphic VT 2/16.    DATE TEST EF   7/17 Echo   40-45 %             Date Cr K Hgb  2/20 0.98 4.9    11/20  1.1 4.7    CTA Ca Score 0  11/18  Complains of exercise intolerance. Heart rate excursion was relatively limited. With reprogramming activation of rate response ambulation associated with chest pain and diaphoresis.    Her daughter died of breast cancer 2018/01/02.  There are 2 children, [name] and [name].  Husband's name is [name].   All are still struggling. The family however is coming to visit in April.      Past Medical History:  Diagnosis Date  . 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 Arthritis   . Breast cancer ([place]) 1986   s/p :L mastectomy  . Dementia ([place])   . Depression/ anxiety   . Hypertension   . Long Q-T syndrome    gene testing pending  . PAF (paroxysmal atrial fibrillation) ([place])    not well documented  . PFO (patent foramen ovale)   . Syncope   . Ventricular tachycardia, polymorphic ([place])    long-short    Past Surgical History:  Procedure Laterality Date  . ABDOMINAL HYSTERECTOMY    . AUGMENTATION MAMMAPLASTY     . BREAST BIOPSY Right   . BREAST SURGERY     mastectomy  . ICD GENERATOR CHANGEOUT N/A 09/23/2019   Procedure: ICD GENERATOR CHANGEOUT;  Surgeon: Deboraha Sprang, MD;  Location: [facility];  Service: Cardiovascular;  Laterality: N/A;  . IMPLANTABLE CARDIOVERTER DEFIBRILLATOR IMPLANT N/A 03/05/2012   MDT dual chamber ICD implanted for secondary prevention for Long QT syndrome  . MASTECTOMY Left   . TOTAL KNEE ARTHROPLASTY     bilateral    Current Outpatient Medications  Medication Sig Dispense Refill  . b complex vitamins capsule Take 1 capsule by mouth daily.    Marland Kitchen dicyclomine (BENTYL) 20 MG tablet Take 20 mg by mouth 4 (four) times daily as needed (stomach pain).     . LORazepam (ATIVAN) 1 MG tablet Take 0.5-1 tablets by mouth at bedtime as needed for anxiety or sleep. May take during the day as well    . Multiple Vitamin (MULTIVITAMIN WITH MINERALS) TABS Take 1 tablet by mouth daily.    . propranolol ER (INDERAL LA) 120 MG 24 hr capsule TAKE TWO CAPSULES (240 MG) IN THE MORNING AND ONE CAPSULE (120 MG) BY MOUTH EVERY EVENING 270 capsule 3  . spironolactone (ALDACTONE) 25 MG tablet TAKE 1/2 TABLET BY MOUTH EVERY DAY 45 tablet 3  . venlafaxine XR (EFFEXOR-XR) 37.5 MG  24 hr capsule Take 37.5 mg by mouth daily with breakfast.    . meloxicam (MOBIC) 15 MG tablet Take 1 tablet by mouth daily.     No current facility-administered medications for this visit.    Allergies  Allergen Reactions  . Codeine Nausea Only    Review of Systems negative except from HPI and PMH  Physical Exam BP 126/74   Pulse 62   Ht 5\' 7"  (1.702 m)   Wt 217 lb 3.2 oz (98.5 kg)   SpO2 95%   BMI 34.02 kg/m  Well developed and well nourished in no acute distress HENT normal Neck supple with JVP-flat Clear Device pocket well healed; without hematoma or erythema.  There is no tethering  Regular rate and rhythm, no  gallop No murmur Abd-soft with active BS No Clubbing cyanosis  edema Skin-warm and  dry A & Oriented  Grossly normal sensory and motor function  ECG AV pacing at 62 Interval 16/14/43 IVCD   Assessment and  Plan  Long QT syndrome-probable  Ventricular tachycardia-nonsustained-polymorphic  ICD implantation-Medtronic device The patient's device was interrogated and the information was fully reviewed.  The device was reprogrammed to inactivate adaptive atrial threshold  Nonischemic cardiomyopathy  Psychosocial Stress  Right bundle branch block left axis deviation  No interval ventricular tachycardia  Device function normal with good healing following device generator replacement  Chronotropic incompetence. With activation of rate response, diaphoresis and chest pain. Not withstanding a calcium score 3 years ago, we will undertake CTA to exclude ischemia in the context of "positive stress test "  Also with a nonischemic myopathy, we will undertake echocardiogram.

## 2019-12-30 NOTE — Patient Instructions (Addendum)
Medication Instructions:  Your physician recommends that you continue on your current medications as directed. Please refer to the Current Medication list given to you today.  *If you need a refill on your cardiac medications before your next appointment, please call your pharmacy*   Lab Work: None today  Your physician recommends that you return for lab work [place]) before your Cardiac CT  If you have labs (blood work) drawn today and your tests are completely normal, you will receive your results only by: Marland Kitchen MyChart Message (if you have MyChart) OR . A paper copy in the mail If you have any lab test that is abnormal or we need to change your treatment, we will call you to review the results.   Testing/Procedures: Your physician has requested that you have an echocardiogram. Echocardiography is a painless test that uses sound waves to create images of your heart. It provides your doctor with information about the size and shape of your heart and how well your heart's chambers and valves are working. This procedure takes approximately one hour. There are no restrictions for this procedure.  Follow-Up: At [facility], you and your health needs are our priority.  As part of our continuing mission to provide you with exceptional heart care, we have created designated Provider Care Teams.  These Care Teams include your primary Cardiologist (physician) and Advanced Practice Providers (APPs -  Physician Assistants and Nurse Practitioners) who all work together to provide you with the care you need, when you need it.  We recommend signing up for the patient portal called "MyChart".  Sign up information is provided on this After Visit Summary.  MyChart is used to connect with patients for Virtual Visits (Telemedicine).  Patients are able to view lab/test results, encounter notes, upcoming appointments, etc.  Non-urgent messages can be sent to your provider as well.   To learn more about what you can  do with MyChart, go to [email or web address].    Your next appointment:   12 month(s)  The format for your next appointment:   In Person  Provider:   Virl Axe, MD   Other Instructions  Your cardiac CT will be scheduled at one of the below locations:   [facility] [address] [place], [place] [address] [phone]  [place] [address] [place], [place] [address] [phone]  If scheduled at [facility], please arrive at the [facility] main entrance of [facility] 30 minutes prior to test start time. Proceed to the [facility] Radiology Department (first floor) to check-in and test prep.  If scheduled at [facility], please arrive 15 mins early for check-in and test prep.  Please follow these instructions carefully (unless otherwise directed):   On the Night Before the Test: . Be sure to Drink plenty of water. . Do not consume any caffeinated/decaffeinated beverages or chocolate 12 hours prior to your test. . Do not take any antihistamines 12 hours prior to your test.   On the Day of the Test: . Drink plenty of water. Do not drink any water within one hour of the test. . Do not eat any food 4 hours prior to the test. . You may take your regular medications prior to the test.  . Take metoprolol (Lopressor) 25 MG two hours prior to test. . HOLD spironolactone morning of the test. . FEMALES- please wear underwire-free bra if available  After the Test: . Drink plenty of water. . After receiving IV contrast, you may experience a mild flushed feeling. This is normal. . On occasion, you may experience a mild rash up to 24 hours after the test. This is not dangerous. If this occurs, you can take Benadryl 25 mg and increase your fluid intake. . If you experience trouble breathing, this can be serious. If it is severe call 911 IMMEDIATELY. If it is  mild, please call our office.   Once we have confirmed authorization from your insurance company, we will call you to set up a date and time for your test. Based on how quickly your insurance processes prior authorizations requests, please allow up to 4 weeks to be contacted for scheduling your Cardiac CT appointment. Be advised that routine Cardiac CT appointments could be scheduled as many as 8 weeks after your provider has ordered it.  For non-scheduling related questions, please contact the cardiac imaging nurse navigator should you have any questions/concerns: Marchia Bond, Cardiac Imaging Nurse Navigator Burley Saver, Interim Cardiac Imaging Nurse [facility] Direct Office Dial: [phone]   For scheduling needs, including cancellations and rescheduling, please call [place], [phone] (temporary number).

## 2020-01-26 ENCOUNTER — Ambulatory Visit (HOSPITAL_COMMUNITY): Payer: Medicare HMO | Attending: Cardiovascular Disease

## 2020-01-26 ENCOUNTER — Other Ambulatory Visit: Payer: Self-pay

## 2020-01-26 DIAGNOSIS — Z95810 Presence of automatic (implantable) cardiac defibrillator: Secondary | ICD-10-CM | POA: Diagnosis not present

## 2020-01-26 DIAGNOSIS — R072 Precordial pain: Secondary | ICD-10-CM | POA: Insufficient documentation

## 2020-01-26 DIAGNOSIS — I472 Ventricular tachycardia: Secondary | ICD-10-CM | POA: Insufficient documentation

## 2020-01-26 DIAGNOSIS — I428 Other cardiomyopathies: Secondary | ICD-10-CM

## 2020-01-26 DIAGNOSIS — I4729 Other ventricular tachycardia: Secondary | ICD-10-CM

## 2020-01-26 LAB — ECHOCARDIOGRAM COMPLETE
Area-P 1/2: 2.55 cm2
S' Lateral: 3.8 cm

## 2020-02-04 DIAGNOSIS — F331 Major depressive disorder, recurrent, moderate: Secondary | ICD-10-CM | POA: Diagnosis not present

## 2020-02-04 DIAGNOSIS — K219 Gastro-esophageal reflux disease without esophagitis: Secondary | ICD-10-CM | POA: Diagnosis not present

## 2020-02-04 DIAGNOSIS — I5022 Chronic systolic (congestive) heart failure: Secondary | ICD-10-CM | POA: Diagnosis not present

## 2020-02-04 DIAGNOSIS — I4581 Long QT syndrome: Secondary | ICD-10-CM | POA: Diagnosis not present

## 2020-02-08 ENCOUNTER — Telehealth: Payer: Self-pay

## 2020-02-08 NOTE — Telephone Encounter (Signed)
Carelink alert received for elevated optivol/ impedence trending down.    Current meds include- Spironolactone 12.5mg  daily.  Spoke with pt, she reports feeling more fatigued lately, denies any increased SOB or new onset cough.    Educated pt on monitoring Sodium intake.  Offered ICM clinic, pt is agreeable to monthly monitoring of fluid levels.

## 2020-02-22 ENCOUNTER — Telehealth: Payer: Self-pay

## 2020-02-22 NOTE — Telephone Encounter (Signed)
Patient referred to [facility] clinic by Trena Platt, RN Device clinic after receiving [facility] Alert for elevated Optivol.   Attempted ICM intro call and left message with number to return call.

## 2020-02-22 NOTE — Telephone Encounter (Signed)
Left message for patient to call back to schedule lab appointment for BMET prior to CT scan on 1/20.

## 2020-02-23 ENCOUNTER — Telehealth (HOSPITAL_COMMUNITY): Payer: Self-pay | Admitting: *Deleted

## 2020-02-23 NOTE — Telephone Encounter (Signed)
Attempted to call patient regarding upcoming cardiac CT appointment. Left message on voicemail with name and callback number  Gaylan Fauver RN Navigator Cardiac Imaging [facility] [phone] Office [phone] Cell  

## 2020-02-25 ENCOUNTER — Ambulatory Visit (HOSPITAL_COMMUNITY)
Admission: RE | Admit: 2020-02-25 | Discharge: 2020-02-25 | Disposition: A | Discharge status: Home / Self Care | Payer: Medicare HMO | Source: Ambulatory Visit | Attending: Internal Medicine | Admitting: Internal Medicine

## 2020-02-25 ENCOUNTER — Other Ambulatory Visit: Payer: Self-pay

## 2020-02-25 DIAGNOSIS — R072 Precordial pain: Secondary | ICD-10-CM | POA: Insufficient documentation

## 2020-02-25 MED ORDER — IOHEXOL 350 MG/ML SOLN
80.0000 mL | Freq: Once | INTRAVENOUS | Status: AC | PRN
  Administered 2020-02-25: 80 mL via INTRAVENOUS

## 2020-02-25 MED ORDER — NITROGLYCERIN 0.4 MG SL SUBL
SUBLINGUAL_TABLET | SUBLINGUAL | Status: AC
  Filled 2020-02-25: qty 2

## 2020-02-25 MED ORDER — NITROGLYCERIN 0.4 MG SL SUBL
0.8000 mg | SUBLINGUAL_TABLET | Freq: Once | SUBLINGUAL | Status: AC
  Administered 2020-02-25: 0.8 mg via SUBLINGUAL

## 2020-02-26 LAB — POCT I-STAT CREATININE: Creatinine, Ser: 1.1 mg/dL — ABNORMAL HIGH (ref 0.44–1.00)

## 2020-03-21 NOTE — Telephone Encounter (Signed)
Attempted ICM intro call and no answer.  

## 2020-03-23 ENCOUNTER — Ambulatory Visit (INDEPENDENT_AMBULATORY_CARE_PROVIDER_SITE_OTHER): Payer: Medicare HMO

## 2020-03-23 DIAGNOSIS — I428 Other cardiomyopathies: Secondary | ICD-10-CM

## 2020-03-23 LAB — CUP PACEART REMOTE DEVICE CHECK
Battery Remaining Longevity: 124 mo
Battery Voltage: 3.04 V
Brady Statistic AP VP Percent: 0.01 %
Brady Statistic AP VS Percent: 18.19 %
Brady Statistic AS VP Percent: 0.03 %
Brady Statistic AS VS Percent: 81.77 %
Brady Statistic RA Percent Paced: 17.96 %
Brady Statistic RV Percent Paced: 0.04 %
Date Time Interrogation Session: 20220216012406
HighPow Impedance: 79 Ohm
Implantable Lead Implant Date: 20140129
Implantable Lead Implant Date: 20140129
Implantable Lead Location: 753859
Implantable Lead Location: 753860
Implantable Lead Model: 180
Implantable Lead Model: 5076
Implantable Lead Serial Number: 310373
Implantable Pulse Generator Implant Date: 20210818
Lead Channel Impedance Value: 437 Ohm
Lead Channel Impedance Value: 513 Ohm
Lead Channel Impedance Value: 532 Ohm
Lead Channel Pacing Threshold Amplitude: 0.5 V
Lead Channel Pacing Threshold Amplitude: 0.625 V
Lead Channel Pacing Threshold Pulse Width: 0.4 ms
Lead Channel Pacing Threshold Pulse Width: 0.4 ms
Lead Channel Sensing Intrinsic Amplitude: 0.375 mV
Lead Channel Sensing Intrinsic Amplitude: 0.375 mV
Lead Channel Sensing Intrinsic Amplitude: 10.125 mV
Lead Channel Sensing Intrinsic Amplitude: 10.125 mV
Lead Channel Setting Pacing Amplitude: 2 V
Lead Channel Setting Pacing Amplitude: 2 V
Lead Channel Setting Pacing Pulse Width: 0.4 ms
Lead Channel Setting Sensing Sensitivity: 0.3 mV

## 2020-03-25 DIAGNOSIS — M19011 Primary osteoarthritis, right shoulder: Secondary | ICD-10-CM | POA: Diagnosis not present

## 2020-03-29 NOTE — Progress Notes (Signed)
Remote ICD transmission.   

## 2020-03-29 NOTE — Telephone Encounter (Signed)
Spoke with patient and ICM intro provided.  She agreed to monthly ICM follow up.  Her EP/Cardiologist is Dr Caryl Comes.  Explained remote transmission has been suggesting possible fluid accumulation over the last month but does last report, 2/16 shows improvement.    She reports her only complaint is an increased tiredness in the last month.  She says she does get SOB when walking a long distance.    Current meds include Spironolactone 12.5 mg daily.  Discussed limiting salt intake and fluid symptoms to report.    She reports she did not receive the 01/25/2021 echo results and requested a call back regarding the results.  Advised will send request to Dr Olin Pia nurse, Rosann Auerbach for follow up.    Copy sent to Dr Caryl Comes as Juluis Rainier.   Provided patient with ICM direct phone number and name.  Encouraged to call for changes in condition.   ICM Remote Transmission scheduled for 04/13/2020.    03/23/2020 Optivol impedance trending slightly below baseline normal.

## 2020-03-30 NOTE — Telephone Encounter (Signed)
Spoke with pt and advised echo and cardiac CT results had been released to MyChart.  Pt advised per Dr Caryl Comes echo showed stable and somewhat improved heart muscle function and her cardiac CT was normal.  Pt verbalizes understanding and thanked RN for the call.

## 2020-03-31 DIAGNOSIS — M19011 Primary osteoarthritis, right shoulder: Secondary | ICD-10-CM | POA: Diagnosis not present

## 2020-04-13 ENCOUNTER — Ambulatory Visit (INDEPENDENT_AMBULATORY_CARE_PROVIDER_SITE_OTHER): Payer: Medicare HMO

## 2020-04-13 DIAGNOSIS — I428 Other cardiomyopathies: Secondary | ICD-10-CM

## 2020-04-13 DIAGNOSIS — Z95810 Presence of automatic (implantable) cardiac defibrillator: Secondary | ICD-10-CM | POA: Diagnosis not present

## 2020-04-14 DIAGNOSIS — F331 Major depressive disorder, recurrent, moderate: Secondary | ICD-10-CM | POA: Diagnosis not present

## 2020-04-14 DIAGNOSIS — I5022 Chronic systolic (congestive) heart failure: Secondary | ICD-10-CM | POA: Diagnosis not present

## 2020-04-14 DIAGNOSIS — L570 Actinic keratosis: Secondary | ICD-10-CM | POA: Diagnosis not present

## 2020-04-14 DIAGNOSIS — N393 Stress incontinence (female) (male): Secondary | ICD-10-CM | POA: Diagnosis not present

## 2020-04-14 DIAGNOSIS — B029 Zoster without complications: Secondary | ICD-10-CM | POA: Diagnosis not present

## 2020-04-14 DIAGNOSIS — K582 Mixed irritable bowel syndrome: Secondary | ICD-10-CM | POA: Diagnosis not present

## 2020-04-14 DIAGNOSIS — Z95810 Presence of automatic (implantable) cardiac defibrillator: Secondary | ICD-10-CM | POA: Diagnosis not present

## 2020-04-14 DIAGNOSIS — F419 Anxiety disorder, unspecified: Secondary | ICD-10-CM | POA: Diagnosis not present

## 2020-04-14 DIAGNOSIS — Z6835 Body mass index (BMI) 35.0-35.9, adult: Secondary | ICD-10-CM | POA: Diagnosis not present

## 2020-04-15 NOTE — Progress Notes (Signed)
EPIC Encounter for ICM Monitoring  Patient Name: Michelle Castro is a 70 y.o. female Date: 04/15/2020 Primary Care Physican: Street, [name], MD Primary Cardiologist: Caryl Comes Electrophysiologist: Caryl Comes 04/15/2020 Weight: 220 lbs        1st ICM Remote Transmission.  Heart Failure questions reviewed.  Pt asymptomatic and feels fine at this time. 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   Optivol thoracic impedance normal.   Prescribed:  Spironolactone 25 mg take 0.5 tablet (12.5 mg total) by mouth daily.  Recommendations: Reinforced limiting salt intake to < 2000 mg daily and fluid intake to 64 oz daily.  Encouraged to call if experiencing fluid symptoms.  Follow-up plan: ICM clinic phone appointment on 05/16/2020.   91 day device clinic remote transmission 06/22/2020.    EP/Cardiology Office Visits:  Recall 12/24/2020 with Dr. Caryl Comes.    Copy of ICM check sent to Dr. Caryl Comes.   3 month ICM trend: 04/14/2020.    1 Year ICM trend:       Rosalene Billings, RN 04/15/2020 9:41 AM

## 2020-04-16 LAB — CUP PACEART REMOTE DEVICE CHECK
Battery Remaining Longevity: 123 mo
Battery Voltage: 3.04 V
Brady Statistic AP VP Percent: 0.02 %
Brady Statistic AP VS Percent: 33.62 %
Brady Statistic AS VP Percent: 0.05 %
Brady Statistic AS VS Percent: 66.31 %
Brady Statistic RA Percent Paced: 31.47 %
Brady Statistic RV Percent Paced: 0.06 %
Date Time Interrogation Session: 20220310132406
HighPow Impedance: 85 Ohm
Implantable Lead Implant Date: 20140129
Implantable Lead Implant Date: 20140129
Implantable Lead Location: 753859
Implantable Lead Location: 753860
Implantable Lead Model: 180
Implantable Lead Model: 5076
Implantable Lead Serial Number: 310373
Implantable Pulse Generator Implant Date: 20210818
Lead Channel Impedance Value: 456 Ohm
Lead Channel Impedance Value: 513 Ohm
Lead Channel Impedance Value: 570 Ohm
Lead Channel Pacing Threshold Amplitude: 0.5 V
Lead Channel Pacing Threshold Amplitude: 0.5 V
Lead Channel Pacing Threshold Pulse Width: 0.4 ms
Lead Channel Pacing Threshold Pulse Width: 0.4 ms
Lead Channel Sensing Intrinsic Amplitude: 1.375 mV
Lead Channel Sensing Intrinsic Amplitude: 1.375 mV
Lead Channel Sensing Intrinsic Amplitude: 6.75 mV
Lead Channel Sensing Intrinsic Amplitude: 6.75 mV
Lead Channel Setting Pacing Amplitude: 2 V
Lead Channel Setting Pacing Amplitude: 2 V
Lead Channel Setting Pacing Pulse Width: 0.4 ms
Lead Channel Setting Sensing Sensitivity: 0.3 mV

## 2020-04-19 ENCOUNTER — Other Ambulatory Visit: Payer: Self-pay | Admitting: Family Medicine

## 2020-04-19 DIAGNOSIS — Z1231 Encounter for screening mammogram for malignant neoplasm of breast: Secondary | ICD-10-CM

## 2020-05-16 ENCOUNTER — Telehealth: Payer: Self-pay

## 2020-05-16 ENCOUNTER — Ambulatory Visit (INDEPENDENT_AMBULATORY_CARE_PROVIDER_SITE_OTHER): Payer: Medicare HMO

## 2020-05-16 DIAGNOSIS — I428 Other cardiomyopathies: Secondary | ICD-10-CM | POA: Diagnosis not present

## 2020-05-16 DIAGNOSIS — Z95810 Presence of automatic (implantable) cardiac defibrillator: Secondary | ICD-10-CM | POA: Diagnosis not present

## 2020-05-16 NOTE — Progress Notes (Signed)
EPIC Encounter for ICM Monitoring  Patient Name: Michelle Castro is a 70 y.o. female Date: 05/16/2020 Primary Care Physican: Street, [name], MD Primary Cardiologist: Caryl Comes Electrophysiologist: Caryl Comes 04/15/2020 Weight: 220 lbs                                                            Attempted call to patient and unable to reach.  Left message to return call. Transmission reviewed. 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   Optivol thoracic impedance trending slightly below baseline normal.   Prescribed:  Spironolactone 25 mg take 0.5 tablet (12.5 mg total) by mouth daily.  Recommendations: Unable to reach.    Follow-up plan: ICM clinic phone appointment on 06/30/2020.   91 day device clinic remote transmission 06/22/2020.    EP/Cardiology Office Visits:  Recall 12/24/2020 with Dr. Caryl Comes.    Copy of ICM check sent to Dr. Caryl Comes.   3 month ICM trend: 05/16/2020.    1 Year ICM trend:       Rosalene Billings, RN 05/16/2020 9:50 AM

## 2020-05-16 NOTE — Telephone Encounter (Signed)
Remote ICM transmission received.  Attempted call to patient regarding ICM remote transmission and left message per DPR to return call.   

## 2020-05-17 NOTE — Progress Notes (Signed)
Spoke with patient.  She is currently at the beach on vacation and eating foods that are higher in salt than she normally eats.  She has no fluid symptoms and feels fine.

## 2020-05-17 NOTE — Progress Notes (Signed)
Attempted ICM return call per voice mail request.

## 2020-05-17 NOTE — Progress Notes (Signed)
Attempted return call to patient and unable to reach.  Left detailed message per DPR regarding transmission. Transmission reviewed.  

## 2020-05-24 DIAGNOSIS — N8110 Cystocele, unspecified: Secondary | ICD-10-CM | POA: Diagnosis not present

## 2020-05-24 DIAGNOSIS — N3946 Mixed incontinence: Secondary | ICD-10-CM | POA: Diagnosis not present

## 2020-05-24 DIAGNOSIS — N816 Rectocele: Secondary | ICD-10-CM | POA: Diagnosis not present

## 2020-06-02 DIAGNOSIS — I5022 Chronic systolic (congestive) heart failure: Secondary | ICD-10-CM | POA: Diagnosis not present

## 2020-06-02 DIAGNOSIS — Z95810 Presence of automatic (implantable) cardiac defibrillator: Secondary | ICD-10-CM | POA: Diagnosis not present

## 2020-06-02 DIAGNOSIS — F419 Anxiety disorder, unspecified: Secondary | ICD-10-CM | POA: Diagnosis not present

## 2020-06-02 DIAGNOSIS — K582 Mixed irritable bowel syndrome: Secondary | ICD-10-CM | POA: Diagnosis not present

## 2020-06-02 DIAGNOSIS — Z0000 Encounter for general adult medical examination without abnormal findings: Secondary | ICD-10-CM | POA: Diagnosis not present

## 2020-06-02 DIAGNOSIS — Z79899 Other long term (current) drug therapy: Secondary | ICD-10-CM | POA: Diagnosis not present

## 2020-06-02 DIAGNOSIS — R7301 Impaired fasting glucose: Secondary | ICD-10-CM | POA: Diagnosis not present

## 2020-06-02 DIAGNOSIS — F331 Major depressive disorder, recurrent, moderate: Secondary | ICD-10-CM | POA: Diagnosis not present

## 2020-06-02 DIAGNOSIS — E785 Hyperlipidemia, unspecified: Secondary | ICD-10-CM | POA: Diagnosis not present

## 2020-06-10 ENCOUNTER — Ambulatory Visit: Payer: Medicare HMO

## 2020-06-22 ENCOUNTER — Ambulatory Visit (INDEPENDENT_AMBULATORY_CARE_PROVIDER_SITE_OTHER): Payer: Medicare HMO

## 2020-06-22 DIAGNOSIS — I428 Other cardiomyopathies: Secondary | ICD-10-CM

## 2020-06-27 ENCOUNTER — Ambulatory Visit (INDEPENDENT_AMBULATORY_CARE_PROVIDER_SITE_OTHER): Payer: Medicare HMO

## 2020-06-27 DIAGNOSIS — Z95810 Presence of automatic (implantable) cardiac defibrillator: Secondary | ICD-10-CM

## 2020-06-27 DIAGNOSIS — I428 Other cardiomyopathies: Secondary | ICD-10-CM | POA: Diagnosis not present

## 2020-06-28 NOTE — Progress Notes (Signed)
EPIC Encounter for ICM Monitoring  Patient Name: Michelle Castro is a 70 y.o. female Date: 06/28/2020 Primary Care Physican: Street, [name], MD Primary Cardiologist:Klein Electrophysiologist:Klein 06/28/2020 Weight:211lbs    Spoke with patient and reports feeling well at this time.  Denies fluid symptoms. She tries to stay on low salt diet but she usually busy taking care of family members so it makes it difficult to restrict salt intake.  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  Optivol thoracic impedance at baseline but possible fluid accumulation from 5/13-5/20.   Prescribed: Spironolactone 25 mg take 0.5 tablet (12.5 mg total) by mouth daily.  Recommendations: Recommendation to limit salt intake to 2000 mg daily and fluid intake to 64 oz daily.  Encouraged to call if experiencing any fluid symptoms.   Follow-up plan: ICM clinic phone appointment on6/27/2022. 91 day device clinic remote transmission 06/22/2020.   EP/Cardiology Office Visits:Recall 12/24/2020 with Dr.Klein.   Copy of ICM check sent to [place].   3 month ICM trend: 06/24/2020.    1 Year ICM trend:       Rosalene Billings, RN 06/28/2020 3:38 PM

## 2020-07-02 LAB — CUP PACEART REMOTE DEVICE CHECK
Battery Remaining Longevity: 121 mo
Battery Voltage: 3.03 V
Brady Statistic AP VP Percent: 0.02 %
Brady Statistic AP VS Percent: 23.04 %
Brady Statistic AS VP Percent: 0.04 %
Brady Statistic AS VS Percent: 76.9 %
Brady Statistic RA Percent Paced: 21.85 %
Brady Statistic RV Percent Paced: 0.06 %
Date Time Interrogation Session: 20220520223823
HighPow Impedance: 74 Ohm
Implantable Lead Implant Date: 20140129
Implantable Lead Implant Date: 20140129
Implantable Lead Location: 753859
Implantable Lead Location: 753860
Implantable Lead Model: 180
Implantable Lead Model: 5076
Implantable Lead Serial Number: 310373
Implantable Pulse Generator Implant Date: 20210818
Lead Channel Impedance Value: 399 Ohm
Lead Channel Impedance Value: 513 Ohm
Lead Channel Impedance Value: 532 Ohm
Lead Channel Pacing Threshold Amplitude: 0.5 V
Lead Channel Pacing Threshold Amplitude: 0.625 V
Lead Channel Pacing Threshold Pulse Width: 0.4 ms
Lead Channel Pacing Threshold Pulse Width: 0.4 ms
Lead Channel Sensing Intrinsic Amplitude: 1.25 mV
Lead Channel Sensing Intrinsic Amplitude: 1.25 mV
Lead Channel Sensing Intrinsic Amplitude: 6.5 mV
Lead Channel Sensing Intrinsic Amplitude: 6.5 mV
Lead Channel Setting Pacing Amplitude: 2 V
Lead Channel Setting Pacing Amplitude: 2 V
Lead Channel Setting Pacing Pulse Width: 0.4 ms
Lead Channel Setting Sensing Sensitivity: 0.3 mV

## 2020-07-06 ENCOUNTER — Other Ambulatory Visit: Payer: Self-pay | Admitting: Internal Medicine

## 2020-07-14 NOTE — Progress Notes (Signed)
Remote ICD transmission.   

## 2020-07-28 ENCOUNTER — Ambulatory Visit: Payer: Medicare HMO

## 2020-08-01 ENCOUNTER — Ambulatory Visit (INDEPENDENT_AMBULATORY_CARE_PROVIDER_SITE_OTHER): Payer: Medicare HMO

## 2020-08-01 DIAGNOSIS — I428 Other cardiomyopathies: Secondary | ICD-10-CM

## 2020-08-01 DIAGNOSIS — Z95810 Presence of automatic (implantable) cardiac defibrillator: Secondary | ICD-10-CM

## 2020-08-03 NOTE — Progress Notes (Signed)
EPIC Encounter for ICM Monitoring  Patient Name: Rhianon Zabawa is a 70 y.o. female Date: 08/03/2020 Primary Care Physican: Street, [name], MD Primary Cardiologist: Caryl Comes Electrophysiologist: Caryl Comes 06/28/2020 Weight: 211 lbs                                                            Spoke with patient and heart failure questions reviewed.  Pt asymptomatic for fluid accumulation and feeing well.  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  She may be drinking more than 64 oz fluid daily   Optivol thoracic impedance suggesting possible fluid accumulation intermittently since 07/01/2020.   Prescribed:  Spironolactone 25 mg take 0.5 tablet (12.5 mg total) by mouth daily.   Recommendations:  Recommendation to limit salt intake to 2000 mg daily and fluid intake to 64 oz daily.  Encouraged to call if experiencing any fluid symptoms.    Follow-up plan: ICM clinic phone appointment on 09/05/2020.   91 day device clinic remote transmission 09/21/2020.     EP/Cardiology Office Visits:  Recall 12/24/2020 with Dr. Caryl Comes.     Copy of ICM check sent to Dr. Caryl Comes.    3 month ICM trend: 08/01/2020.    1 Year ICM trend:       Rosalene Billings, RN 08/03/2020 9:10 AM

## 2020-08-06 ENCOUNTER — Other Ambulatory Visit: Payer: Self-pay | Admitting: Internal Medicine

## 2020-08-11 ENCOUNTER — Other Ambulatory Visit: Payer: Self-pay | Admitting: *Deleted

## 2020-08-11 MED ORDER — PROPRANOLOL HCL ER 120 MG PO CP24: ORAL_CAPSULE | ORAL | 3 refills | Status: DC

## 2020-08-20 ENCOUNTER — Other Ambulatory Visit: Payer: Self-pay | Admitting: Internal Medicine

## 2020-08-22 NOTE — Telephone Encounter (Signed)
Outpatient Medication Detail   Disp Refills Start End   propranolol ER (INDERAL LA) 120 MG 24 hr capsule 270 capsule 3 08/11/2020    Sig: Take 2 capsules by mouth in the a.m., take 1 capsule by mouth every evening.   Sent to pharmacy as: propranolol ER (INDERAL LA) 120 MG 24 hr capsule   E-Prescribing Status: Receipt confirmed by pharmacy (08/11/2020  7:02 AM EDT)     Pharmacy  [facility] - [address], [address] [place]

## 2020-09-05 ENCOUNTER — Ambulatory Visit (INDEPENDENT_AMBULATORY_CARE_PROVIDER_SITE_OTHER): Payer: Medicare HMO

## 2020-09-05 DIAGNOSIS — Z95810 Presence of automatic (implantable) cardiac defibrillator: Secondary | ICD-10-CM

## 2020-09-05 DIAGNOSIS — I428 Other cardiomyopathies: Secondary | ICD-10-CM

## 2020-09-06 ENCOUNTER — Ambulatory Visit
Admission: RE | Admit: 2020-09-06 | Discharge: 2020-09-06 | Disposition: A | Discharge status: Home / Self Care | Payer: Medicare HMO | Source: Ambulatory Visit | Attending: Family Medicine | Admitting: Family Medicine

## 2020-09-06 ENCOUNTER — Telehealth: Payer: Self-pay

## 2020-09-06 ENCOUNTER — Other Ambulatory Visit: Payer: Self-pay

## 2020-09-06 DIAGNOSIS — Z1231 Encounter for screening mammogram for malignant neoplasm of breast: Secondary | ICD-10-CM | POA: Diagnosis not present

## 2020-09-06 NOTE — Telephone Encounter (Signed)
Remote ICM transmission received.  Attempted call to patient regarding ICM remote transmission and left detailed message per DPR.  Advised to return call for any fluid symptoms or questions. Next ICM remote transmission scheduled 10/11/2020.

## 2020-09-06 NOTE — Progress Notes (Signed)
EPIC Encounter for ICM Monitoring  Patient Name: Michelle Castro is a 70 y.o. female Date: 09/06/2020 Primary Care Physican: Street, [name], MD Primary Cardiologist: Caryl Comes Electrophysiologist: Caryl Comes 06/28/2020 Weight: 211 lbs                                                            Attempted call to patient and unable to reach.  Left detailed message per DPR regarding transmission. Transmission reviewed.  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     Optivol thoracic impedance suggesting normal fluid levels.   Prescribed:  Spironolactone 25 mg take 0.5 tablet (12.5 mg total) by mouth daily.   Recommendations:  Left voice mail with ICM number and encouraged to call if experiencing any fluid symptoms.   Follow-up plan: ICM clinic phone appointment on 10/11/2020.   91 day device clinic remote transmission 09/21/2020.     EP/Cardiology Office Visits:  Recall 12/24/2020 with Dr. Caryl Comes.     Copy of ICM check sent to Dr. Caryl Comes.     3 month ICM trend: 09/05/2020.    1 Year ICM trend:       Rosalene Billings, RN 09/06/2020 10:44 AM

## 2020-09-21 ENCOUNTER — Ambulatory Visit (INDEPENDENT_AMBULATORY_CARE_PROVIDER_SITE_OTHER): Payer: Medicare HMO

## 2020-09-21 DIAGNOSIS — I428 Other cardiomyopathies: Secondary | ICD-10-CM | POA: Diagnosis not present

## 2020-09-22 LAB — CUP PACEART REMOTE DEVICE CHECK
Battery Remaining Longevity: 118 mo
Battery Voltage: 3.02 V
Brady Statistic AP VP Percent: 0 %
Brady Statistic AP VS Percent: 26.11 %
Brady Statistic AS VP Percent: 0.02 %
Brady Statistic AS VS Percent: 73.86 %
Brady Statistic RA Percent Paced: 25.93 %
Brady Statistic RV Percent Paced: 0.03 %
Date Time Interrogation Session: 20220817012304
HighPow Impedance: 72 Ohm
Implantable Lead Implant Date: 20140129
Implantable Lead Implant Date: 20140129
Implantable Lead Location: 753859
Implantable Lead Location: 753860
Implantable Lead Model: 180
Implantable Lead Model: 5076
Implantable Lead Serial Number: 310373
Implantable Pulse Generator Implant Date: 20210818
Lead Channel Impedance Value: 399 Ohm
Lead Channel Impedance Value: 513 Ohm
Lead Channel Impedance Value: 513 Ohm
Lead Channel Pacing Threshold Amplitude: 0.5 V
Lead Channel Pacing Threshold Amplitude: 0.5 V
Lead Channel Pacing Threshold Pulse Width: 0.4 ms
Lead Channel Pacing Threshold Pulse Width: 0.4 ms
Lead Channel Sensing Intrinsic Amplitude: 1.25 mV
Lead Channel Sensing Intrinsic Amplitude: 1.25 mV
Lead Channel Sensing Intrinsic Amplitude: 9 mV
Lead Channel Sensing Intrinsic Amplitude: 9 mV
Lead Channel Setting Pacing Amplitude: 2 V
Lead Channel Setting Pacing Amplitude: 2 V
Lead Channel Setting Pacing Pulse Width: 0.4 ms
Lead Channel Setting Sensing Sensitivity: 0.3 mV

## 2020-10-11 ENCOUNTER — Telehealth: Payer: Self-pay

## 2020-10-11 ENCOUNTER — Ambulatory Visit (INDEPENDENT_AMBULATORY_CARE_PROVIDER_SITE_OTHER): Payer: Medicare HMO

## 2020-10-11 DIAGNOSIS — I428 Other cardiomyopathies: Secondary | ICD-10-CM | POA: Diagnosis not present

## 2020-10-11 DIAGNOSIS — Z95810 Presence of automatic (implantable) cardiac defibrillator: Secondary | ICD-10-CM | POA: Diagnosis not present

## 2020-10-11 NOTE — Telephone Encounter (Signed)
Remote ICM transmission received.  Attempted call to patient regarding ICM remote transmission and left detailed message per DPR to return call.  Advised to return call for any fluid symptoms or questions. Next remote transmission scheduled 10/17/2020.

## 2020-10-11 NOTE — Progress Notes (Signed)
Remote ICD transmission.   

## 2020-10-11 NOTE — Progress Notes (Signed)
EPIC Encounter for ICM Monitoring  Patient Name: Michelle Castro is a 70 y.o. female Date: 10/11/2020 Primary Care Physican: Street, [name], MD Primary Cardiologist: Caryl Comes Electrophysiologist: Caryl Comes 06/28/2020 Weight: 211 lbs                                                            Attempted call to patient and unable to reach.  Left detailed message per DPR regarding transmission. Transmission reviewed.  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     Optivol thoracic impedance suggesting possible fluid accumulation sine 8/31.   Prescribed:  Spironolactone 25 mg take 0.5 tablet (12.5 mg total) by mouth daily.   Recommendations:  Left voice mail with ICM number and encouraged to call if experiencing any fluid symptoms.   Follow-up plan: ICM clinic phone appointment on 10/17/2020 (manual) to recheck fluid levels.   91 day device clinic remote transmission 12/21/2020.     EP/Cardiology Office Visits:  Recall 12/24/2020 with Dr. Caryl Comes.     Copy of ICM check sent to Dr. Caryl Comes.   3 month ICM trend: 10/11/2020.    1 Year ICM trend:       Rosalene Billings, RN 10/11/2020 3:18 PM

## 2020-10-19 ENCOUNTER — Telehealth: Payer: Self-pay

## 2020-10-19 DIAGNOSIS — J22 Unspecified acute lower respiratory infection: Secondary | ICD-10-CM | POA: Diagnosis not present

## 2020-10-19 DIAGNOSIS — Z20828 Contact with and (suspected) exposure to other viral communicable diseases: Secondary | ICD-10-CM | POA: Diagnosis not present

## 2020-10-19 NOTE — Telephone Encounter (Signed)
I spoke with the patient and she agreed to send missed ICM transmission today.

## 2020-10-21 NOTE — Progress Notes (Signed)
No ICM remote transmission received for 10/17/2020 and next ICM transmission scheduled for 11/21/2020.

## 2020-11-21 ENCOUNTER — Ambulatory Visit (INDEPENDENT_AMBULATORY_CARE_PROVIDER_SITE_OTHER): Payer: Medicare HMO

## 2020-11-21 DIAGNOSIS — Z95810 Presence of automatic (implantable) cardiac defibrillator: Secondary | ICD-10-CM | POA: Diagnosis not present

## 2020-11-21 DIAGNOSIS — I428 Other cardiomyopathies: Secondary | ICD-10-CM

## 2020-11-22 DIAGNOSIS — R0981 Nasal congestion: Secondary | ICD-10-CM | POA: Diagnosis not present

## 2020-11-22 DIAGNOSIS — R509 Fever, unspecified: Secondary | ICD-10-CM | POA: Diagnosis not present

## 2020-11-22 DIAGNOSIS — R519 Headache, unspecified: Secondary | ICD-10-CM | POA: Diagnosis not present

## 2020-11-22 DIAGNOSIS — J020 Streptococcal pharyngitis: Secondary | ICD-10-CM | POA: Diagnosis not present

## 2020-11-23 NOTE — Progress Notes (Signed)
EPIC Encounter for ICM Monitoring  Patient Name: Michelle Castro is a 70 y.o. female Date: 11/23/2020 Primary Care Physican: Street, [name], MD Primary Cardiologist: Caryl Comes Electrophysiologist: Caryl Comes 06/28/2020 Weight: 211 lbs  Time in AT/AF <0.1 hr/day (<0.1%)                                                            Transmission reviewed.      Optivol thoracic impedance suggesting normal fluid levels.   Prescribed:  Spironolactone 25 mg take 0.5 tablet (12.5 mg total) by mouth daily.   Recommendations:  No changes.   Follow-up plan: ICM clinic phone appointment on 12/26/2020.   91 day device clinic remote transmission 12/21/2020.     EP/Cardiology Office Visits:  Recall 12/24/2020 with Dr. Caryl Comes.     Copy of ICM check sent to Dr. Caryl Comes.    3 month ICM trend: 11/21/2020.    1 Year ICM trend:       Rosalene Billings, RN 11/23/2020 4:08 PM

## 2020-12-09 DIAGNOSIS — N8110 Cystocele, unspecified: Secondary | ICD-10-CM | POA: Diagnosis not present

## 2020-12-09 DIAGNOSIS — N816 Rectocele: Secondary | ICD-10-CM | POA: Diagnosis not present

## 2020-12-09 DIAGNOSIS — N3941 Urge incontinence: Secondary | ICD-10-CM | POA: Diagnosis not present

## 2020-12-21 ENCOUNTER — Ambulatory Visit (INDEPENDENT_AMBULATORY_CARE_PROVIDER_SITE_OTHER): Payer: Medicare HMO

## 2020-12-21 DIAGNOSIS — I428 Other cardiomyopathies: Secondary | ICD-10-CM | POA: Diagnosis not present

## 2020-12-21 LAB — CUP PACEART REMOTE DEVICE CHECK
Battery Remaining Longevity: 115 mo
Battery Voltage: 3.02 V
Brady Statistic AP VP Percent: 0.01 %
Brady Statistic AP VS Percent: 25.21 %
Brady Statistic AS VP Percent: 0.03 %
Brady Statistic AS VS Percent: 74.75 %
Brady Statistic RA Percent Paced: 24.24 %
Brady Statistic RV Percent Paced: 0.04 %
Date Time Interrogation Session: 20221116044223
HighPow Impedance: 79 Ohm
Implantable Lead Implant Date: 20140129
Implantable Lead Implant Date: 20140129
Implantable Lead Location: 753859
Implantable Lead Location: 753860
Implantable Lead Model: 180
Implantable Lead Model: 5076
Implantable Lead Serial Number: 310373
Implantable Pulse Generator Implant Date: 20210818
Lead Channel Impedance Value: 380 Ohm
Lead Channel Impedance Value: 532 Ohm
Lead Channel Impedance Value: 570 Ohm
Lead Channel Pacing Threshold Amplitude: 0.5 V
Lead Channel Pacing Threshold Amplitude: 0.875 V
Lead Channel Pacing Threshold Pulse Width: 0.4 ms
Lead Channel Pacing Threshold Pulse Width: 0.4 ms
Lead Channel Sensing Intrinsic Amplitude: 1.25 mV
Lead Channel Sensing Intrinsic Amplitude: 1.25 mV
Lead Channel Sensing Intrinsic Amplitude: 7.375 mV
Lead Channel Sensing Intrinsic Amplitude: 7.375 mV
Lead Channel Setting Pacing Amplitude: 2 V
Lead Channel Setting Pacing Amplitude: 2 V
Lead Channel Setting Pacing Pulse Width: 0.4 ms
Lead Channel Setting Sensing Sensitivity: 0.3 mV

## 2020-12-26 ENCOUNTER — Ambulatory Visit (INDEPENDENT_AMBULATORY_CARE_PROVIDER_SITE_OTHER): Payer: Medicare HMO

## 2020-12-26 DIAGNOSIS — I428 Other cardiomyopathies: Secondary | ICD-10-CM

## 2020-12-26 DIAGNOSIS — Z95810 Presence of automatic (implantable) cardiac defibrillator: Secondary | ICD-10-CM | POA: Diagnosis not present

## 2020-12-27 ENCOUNTER — Telehealth: Payer: Self-pay

## 2020-12-27 NOTE — Progress Notes (Signed)
EPIC Encounter for ICM Monitoring  Patient Name: Michelle Castro is a 70 y.o. female Date: 12/27/2020 Primary Care Physican: Street, [name], MD Primary Cardiologist: Caryl Comes Electrophysiologist: Caryl Comes 06/28/2020 Weight: 211 lbs   Time in AT/AF  <0.1 hr/day (<0.1%)                                                            Attempted call to patient and unable to reach.  Left detailed message per DPR regarding transmission. Transmission reviewed.    Optivol thoracic impedance suggesting normal fluid levels but was suggesting possible fluid accumulation from 11/5-11/16.   Prescribed:  Spironolactone 25 mg take 0.5 tablet (12.5 mg total) by mouth daily.   Recommendations:  Left voice mail with ICM number and encouraged to call if experiencing any fluid symptoms.   Follow-up plan: ICM clinic phone appointment on 02/07/2021.   91 day device clinic remote transmission 03/22/2021.     EP/Cardiology Office Visits:  Recall 12/24/2020 with Dr. Caryl Comes.     Copy of ICM check sent to Dr. Caryl Comes.    3 month ICM trend: 12/26/2020.    12-14 Month ICM trend:       Rosalene Billings, RN 12/27/2020 9:54 AM

## 2020-12-27 NOTE — Telephone Encounter (Signed)
Remote ICM transmission received.  Attempted call to patient regarding ICM remote transmission and left detailed message per DPR.  Advised to return call for any fluid symptoms or questions. Next ICM remote transmission scheduled 02/07/2021.

## 2020-12-28 NOTE — Progress Notes (Signed)
Remote ICD transmission.   

## 2021-02-07 ENCOUNTER — Ambulatory Visit (INDEPENDENT_AMBULATORY_CARE_PROVIDER_SITE_OTHER): Payer: Medicare HMO

## 2021-02-07 DIAGNOSIS — I428 Other cardiomyopathies: Secondary | ICD-10-CM | POA: Diagnosis not present

## 2021-02-07 DIAGNOSIS — Z95810 Presence of automatic (implantable) cardiac defibrillator: Secondary | ICD-10-CM | POA: Diagnosis not present

## 2021-02-10 ENCOUNTER — Telehealth: Payer: Self-pay

## 2021-02-10 NOTE — Progress Notes (Signed)
EPIC Encounter for ICM Monitoring  Patient Name: Michelle Castro is a 71 y.o. female Date: 02/10/2021 Primary Care Physican: Street, [name], MD Primary Cardiologist: Caryl Comes Electrophysiologist: Caryl Comes 12/09/2020 Office Weight: 218 lbs   Time in AT/AF  0.0 hr/day (0.0%)                                                            Attempted call to patient and unable to reach.  Left detailed message per DPR regarding transmission. Transmission reviewed.    Optivol thoracic impedance suggesting possible fluid accumulation starting 12/25 and returned to normal 02/07/21.   Prescribed:  Spironolactone 25 mg take 0.5 tablet (12.5 mg total) by mouth daily.   Recommendations:  Left voice mail with ICM number and encouraged to call if experiencing any fluid symptoms.   Follow-up plan: ICM clinic phone appointment on 03/13/2021.   91 day device clinic remote transmission 03/22/2021.     EP/Cardiology Office Visits:  Recall 12/24/2020 with Dr. Caryl Comes.     Copy of ICM check sent to Dr. Caryl Comes.    3 month ICM trend: 02/07/2021.    12-14 Month ICM trend:     Rosalene Billings, RN 02/10/2021 3:48 PM

## 2021-02-10 NOTE — Telephone Encounter (Signed)
Remote ICM transmission received.  Attempted call to patient regarding ICM remote transmission and left detailed message per DPR.  Advised to return call for any fluid symptoms or questions. Next ICM remote transmission scheduled 03/13/2021.   ° °

## 2021-02-16 DIAGNOSIS — M2011 Hallux valgus (acquired), right foot: Secondary | ICD-10-CM | POA: Diagnosis not present

## 2021-03-03 ENCOUNTER — Telehealth: Payer: Self-pay | Admitting: *Deleted

## 2021-03-03 NOTE — Telephone Encounter (Signed)
° °  Pre-operative Risk Assessment    Patient Name: Michelle Castro  DOB: 03-Nov-1950 MRN: [id number]  [place], RN FROM DR. TILLES OFFICE CALLED ASKING ABOUT PRE OP CLEARANCE WITH DR. Caryl Comes. I INFORMED MARIE THAT I HAD NOT RECEIVED A CLEARANCE REQUEST. MARIE WAS ABLE TO PROVIDE ME WITH THE NEEDED INFORMATION.    PT LAST SEEN 2021; PT WILL NEED APPT WITH DR. Caryl Comes OR EP APP; WILL SEND MESSAGE TO EP SCHEDULER TO REACH OUT TO THE PT WITH APPT FOR PRE OP CLEARANCE. I WILL SEND FYI TO SURGEON'S OFFICE PT WILL NEED APPT Request for Surgical Clearance    Procedure:   BUNIONECTOMY   Date of Surgery:  Clearance TBD                                 Surgeon:  DR. Clide Dales Surgeon's Group or Practice Name: [facility] Phone number:  [phone] Fax number:  [phone] ATTN: MARIE   Type of Clearance Requested:   - Medical    Type of Anesthesia:   CHOICE   Additional requests/questions:    Jiles Prows   03/03/2021, 3:48 PM

## 2021-03-08 NOTE — Telephone Encounter (Signed)
Pt has been scheduled with Tommye Standard, [facility] 03/29/21. I have added pre op clearance need to appt notes. I will update the requesting office the pt has appt 03/29/21. I will forward clearance notes to [facility] for upcoming appt.

## 2021-03-13 ENCOUNTER — Telehealth: Payer: Self-pay

## 2021-03-13 ENCOUNTER — Ambulatory Visit (INDEPENDENT_AMBULATORY_CARE_PROVIDER_SITE_OTHER): Payer: Medicare HMO

## 2021-03-13 DIAGNOSIS — Z95810 Presence of automatic (implantable) cardiac defibrillator: Secondary | ICD-10-CM

## 2021-03-13 DIAGNOSIS — I428 Other cardiomyopathies: Secondary | ICD-10-CM

## 2021-03-13 NOTE — Telephone Encounter (Signed)
Patient called in wanting a nurse to go over her last transmission as she is not feeling well. Patient states her heart has been racing and she feels like she is going to faint

## 2021-03-13 NOTE — Progress Notes (Signed)
EPIC Encounter for ICM Monitoring  Patient Name: Michelle Castro is a 71 y.o. female Date: 03/13/2021 Primary Care Physican: Street, [name], MD Primary Cardiologist: Caryl Comes Electrophysiologist: Caryl Comes 12/09/2020 Office Weight: 218 lbs   Time in AT/AF  0.0 hr/day (0.0%)  Monitored VT (150-222 bpm) 1                                                            Transmission reviewed.    Optivol thoracic impedance suggesting normal fluid levels.  Trena Platt, RN Device clinic attempted to call patient today, 2/6 regarding monitored VT episode.   Prescribed:  Spironolactone 25 mg take 0.5 tablet (12.5 mg total) by mouth daily.   Recommendations:  No changes.   Follow-up plan: ICM clinic phone appointment on 04/17/2021.   91 day device clinic remote transmission 03/22/2021.     EP/Cardiology Office Visits:  2/22/203 with Tommye Standard, PA.  Recall 12/24/2020 with Dr. Caryl Comes.     Copy of ICM check sent to Dr. Caryl Comes.     3 month ICM trend: 03/13/2021.    12-14 Month ICM trend:     Rosalene Billings, RN 03/13/2021 4:14 PM

## 2021-03-13 NOTE — Telephone Encounter (Signed)
Returned patients phone call.   Reports Friday night she was laying in bed watching tv, rapid onset of palpitations, fatigue and chest discomfort. Resolved within a few minutes. Reports compliance with medications on file. Shock plan reviewed. Advised I will forward to Dr. Quentin Ore for review and recommendation.   Has upcoming apt with EP APP 03/29/21.

## 2021-03-13 NOTE — Telephone Encounter (Signed)
Attempted to return patient phone call, no answer.  LVM with DC Hours and # to return call.   Manual transmission reviewed.  Noted increase in RV pacing to 100% starting in January.  Also patient has had 1 monitored VT episode on 2/3 Average A/V rate 92/175  Device programmed for VF only zone >222bpm, all therapies off below that.    Patient does have scheduled appt with RU, PA on 03/29/21.

## 2021-03-16 NOTE — Telephone Encounter (Signed)
Patient called and apt. Made in-clinic 03/17/21. Advised location, date and time of apt with verbal understanding. Patient also aware of the possibility extended wait time considering no EP provider in office but Dr. Quentin Ore has agreed to come into the clinic to see patient d/t urgency of event.

## 2021-03-17 ENCOUNTER — Encounter: Payer: Self-pay | Admitting: Cardiology

## 2021-03-17 ENCOUNTER — Ambulatory Visit: Payer: Medicare HMO | Admitting: Cardiology

## 2021-03-17 ENCOUNTER — Other Ambulatory Visit: Payer: Self-pay

## 2021-03-17 VITALS — BP 128/90 | HR 64 | Ht 67.0 in | Wt 215.2 lb

## 2021-03-17 DIAGNOSIS — Z79899 Other long term (current) drug therapy: Secondary | ICD-10-CM

## 2021-03-17 DIAGNOSIS — I428 Other cardiomyopathies: Secondary | ICD-10-CM

## 2021-03-17 DIAGNOSIS — I472 Ventricular tachycardia, unspecified: Secondary | ICD-10-CM | POA: Diagnosis not present

## 2021-03-17 NOTE — Progress Notes (Signed)
Electrophysiology Office Follow up Visit Note:    Date:  03/17/2021   ID:  Michelle Castro, DOB 08-Feb-1950, MRN [id number]  PCP:  Street, [name], MD   [facility] Electrophysiologist:  Dr Caryl Comes.   Interval History:    Michelle Castro is a 71 y.o. female who presents for a follow up visit. They were last seen in clinic 12/30/2019 by Dr Caryl Comes. She carries a diagnosis of probable LQTS with PMVT now s/p ICD.  She called the office feeling lightheaded and dizzy. Device interrogation shows sustained VT episode (monomorphic) in the monitor zone. Episode lasted 44 seconds and was highly symptomatic.  She presents today for follow up. She tells me there are no recent illness or changes to medications. No missed doses of medications.     Past Medical History:  Diagnosis Date   Anomalous coronary artery origin    RCA come off the left coronary cusp   Arthritis    Breast cancer ([place]) 1986   s/p :L mastectomy   Dementia ([place])    Depression/ anxiety    Hypertension    Long Q-T syndrome    gene testing pending   PAF (paroxysmal atrial fibrillation) ([place])    not well documented   PFO (patent foramen ovale)    Syncope    Ventricular tachycardia, polymorphic    long-short    Past Surgical History:  Procedure Laterality Date   ABDOMINAL HYSTERECTOMY     AUGMENTATION MAMMAPLASTY     BREAST BIOPSY Right    BREAST SURGERY     mastectomy   ICD GENERATOR CHANGEOUT N/A 09/23/2019   Procedure: ICD GENERATOR CHANGEOUT;  Surgeon: Deboraha Sprang, MD;  Location: [facility];  Service: Cardiovascular;  Laterality: N/A;   IMPLANTABLE CARDIOVERTER DEFIBRILLATOR IMPLANT N/A 03/05/2012   MDT dual chamber ICD implanted for secondary prevention for Long QT syndrome   MASTECTOMY Left    TOTAL KNEE ARTHROPLASTY     bilateral    Current Medications: Current Meds  Medication Sig   b complex vitamins capsule Take 1 capsule by mouth daily.   dicyclomine (BENTYL) 20 MG tablet Take 20 mg by  mouth 4 (four) times daily as needed (stomach pain).    LORazepam (ATIVAN) 1 MG tablet Take 0.5-1 tablets by mouth at bedtime as needed for anxiety or sleep. May take during the day as well   meloxicam (MOBIC) 15 MG tablet Take 1 tablet by mouth daily.   Multiple Vitamin (MULTIVITAMIN WITH MINERALS) TABS Take 1 tablet by mouth daily.   propranolol ER (INDERAL LA) 120 MG 24 hr capsule Take 2 capsules by mouth in the a.m., take 1 capsule by mouth every evening.   spironolactone (ALDACTONE) 25 MG tablet TAKE 1/2 TABLET EVERY DAY   tolterodine (DETROL LA) 4 MG 24 hr capsule Take 4 mg by mouth daily.   valACYclovir (VALTREX) 500 MG tablet Take 500 mg by mouth daily.   venlafaxine XR (EFFEXOR-XR) 37.5 MG 24 hr capsule Take 37.5 mg by mouth daily with breakfast.     Allergies:   Codeine   Social History   Socioeconomic History   Marital status: Divorced    Spouse name: Not on file   Number of children: Not on file   Years of education: Not on file   Highest education level: Not on file  Occupational History   Not on file  Tobacco Use   Smoking status: Never   Smokeless tobacco: Never  Substance and Sexual Activity  Alcohol use: No   Drug use: No   Sexual activity: Not on file  Other Topics Concern   Not on file  Social History Narrative   Not on file   Social Determinants of Health   Financial Resource Strain: Not on file  Food Insecurity: Not on file  Transportation Needs: Not on file  Physical Activity: Not on file  Stress: Not on file  Social Connections: Not on file     Family History: The patient's family history includes Cancer in her brother and father; Hypertension in her father.  ROS:   Please see the history of present illness.    All other systems reviewed and are negative.  EKGs/Labs/Other Studies Reviewed:    The following studies were reviewed today:  03/17/2021 in clinic device interrogation personally reviewed 9.23yr longevity Lead parameters  stable Monitored VT episode 03/10/2020 lasting 44 seconds. This episode was monomorphic VT. Median ventricular CL during episode was 351ms (200 bpm). Max rate during episode was 200 bpm. Increase in V pacing burden to 100% over the past 2 months Optivol OK Today I added VT zone started at 188bpm (312ms). 9 ATP schemes followed by shocks.  EKG:  The ekg ordered today demonstrates sinus rhythm with V pacing. PR 257ms.  Recent Labs: No results found for requested labs within last 8760 hours.  Recent Lipid Panel No results found for: CHOL, TRIG, HDL, CHOLHDL, VLDL, LDLCALC, LDLDIRECT  Physical Exam:    VS:  BP 128/90    Pulse 64    Ht 5\' 7"  (1.702 m)    Wt 215 lb 3.2 oz (97.6 kg)    SpO2 96%    BMI 33.71 kg/m     Wt Readings from Last 3 Encounters:  03/17/21 215 lb 3.2 oz (97.6 kg)  12/30/19 217 lb 3.2 oz (98.5 kg)  09/23/19 220 lb (99.8 kg)     GEN:  Well nourished, well developed in no acute distress HEENT: Normal NECK: No JVD; No carotid bruits LYMPHATICS: No lymphadenopathy CARDIAC: RRR, no murmurs, rubs, gallops RESPIRATORY:  Clear to auscultation without rales, wheezing or rhonchi  ABDOMEN: Soft, non-tender, non-distended MUSCULOSKELETAL:  No edema; No deformity  SKIN: Warm and dry NEUROLOGIC:  Alert and oriented x 3 PSYCHIATRIC:  Normal affect        ASSESSMENT:    1. VT (ventricular tachycardia)   2. Medication management    PLAN:    In order of problems listed above:  #VT Unclear trigger. This episode of VT was monomorphic which is different than her previous PMVT episodes. She has been taking her medications and no new medications, etc. The only change recently is a new RV pacing burden.   Today I have reprogrammed her ICD to include VT therapies starting slightly slower than her documented VT CL. I will also get some blood work today to check electrolytes and BNP.  She has scheduled follow up in less than 2 weeks with Renee. At that appointment, I would like  to try to reprogram her AV delays further to minimize ventricular pacing.   I will also order an echo to reassess her LV function. Could we be capturing the early signs of worsening LV function (?pacing induced cardiomyopathy) with electrical instability?  F/u 2 weeks with Renee.  Total time spent with patient today 60 minutes. This includes reviewing records, evaluating the patient and coordinating care.   Medication Adjustments/Labs and Tests Ordered: Current medicines are reviewed at length with the patient today.  Concerns  regarding medicines are outlined above.  Orders Placed This Encounter  Procedures   Basic metabolic panel   Pro b natriuretic peptide (BNP)   Magnesium   EKG 12-Lead   No orders of the defined types were placed in this encounter.    Signed, Lars Mage, MD, [facility], [facility] 03/17/2021 9:00 PM    Electrophysiology [facility]

## 2021-03-17 NOTE — Patient Instructions (Addendum)
Medication Instructions:  Your physician recommends that you continue on your current medications as directed. Please refer to the Current Medication list given to you today.  *If you need a refill on your cardiac medications before your next appointment, please call your pharmacy*   Lab Work: Today: BMET, BNP, Magnesium level If you have labs (blood work) drawn today and your tests are completely normal, you will receive your results only by: [place] (if you have MyChart) OR A paper copy in the mail If you have any lab test that is abnormal or we need to change your treatment, we will call you to review the results.   Testing/Procedures: None ordered   Follow-Up: At [facility], you and your health needs are our priority.  As part of our continuing mission to provide you with exceptional heart care, we have created designated Provider Care Teams.  These Care Teams include your primary Cardiologist (physician) and Advanced Practice Providers (APPs -  Physician Assistants and Nurse Practitioners) who all work together to provide you with the care you need, when you need it.  Your next appointment:   Keep   scheduled follow up on 03/29/21  The format for your next appointment:   In Person  Provider:   Tommye Standard, PA-C    Thank you for choosing [facility]!!

## 2021-03-18 LAB — BASIC METABOLIC PANEL
BUN/Creatinine Ratio: 14 (ref 12–28)
BUN: 16 mg/dL (ref 8–27)
CO2: 22 mmol/L (ref 20–29)
Calcium: 9.7 mg/dL (ref 8.7–10.3)
Chloride: 100 mmol/L (ref 96–106)
Creatinine, Ser: 1.14 mg/dL — ABNORMAL HIGH (ref 0.57–1.00)
Glucose: 98 mg/dL (ref 70–99)
Potassium: 4.5 mmol/L (ref 3.5–5.2)
Sodium: 137 mmol/L (ref 134–144)
eGFR: 51 mL/min/{1.73_m2} — ABNORMAL LOW (ref >59)

## 2021-03-18 LAB — PRO B NATRIURETIC PEPTIDE: NT-Pro BNP: 814 pg/mL — ABNORMAL HIGH (ref 0–301)

## 2021-03-18 LAB — MAGNESIUM: Magnesium: 2.2 mg/dL (ref 1.6–2.3)

## 2021-03-20 ENCOUNTER — Telehealth: Payer: Self-pay | Admitting: *Deleted

## 2021-03-20 DIAGNOSIS — I428 Other cardiomyopathies: Secondary | ICD-10-CM

## 2021-03-20 NOTE — Telephone Encounter (Signed)
-----   Message from Vickie Epley, MD sent at 03/17/2021  9:09 PM EST ----- Ardath Lepak/Jenny, Can you order an echo for Ms Canepa? Dx: cardiomyopathy  Renee, You are seeing this patient in < 2 weeks. At your appointment, need to see if you can reprogram AV delays to minimize ventricular pacing.  If not and LV function is still down on repeat echo, need to consider upgrade to biV pacing.  Richardson Landry, See clinic note.

## 2021-03-20 NOTE — Telephone Encounter (Signed)
Left detailed message okay per DPR about Echo order and office to call for schedule. Advised to call with any questions.

## 2021-03-21 DIAGNOSIS — M1811 Unilateral primary osteoarthritis of first carpometacarpal joint, right hand: Secondary | ICD-10-CM | POA: Diagnosis not present

## 2021-03-21 DIAGNOSIS — M19041 Primary osteoarthritis, right hand: Secondary | ICD-10-CM | POA: Diagnosis not present

## 2021-03-22 ENCOUNTER — Ambulatory Visit (INDEPENDENT_AMBULATORY_CARE_PROVIDER_SITE_OTHER): Payer: Medicare HMO

## 2021-03-22 DIAGNOSIS — I428 Other cardiomyopathies: Secondary | ICD-10-CM | POA: Diagnosis not present

## 2021-03-22 LAB — CUP PACEART REMOTE DEVICE CHECK
Battery Remaining Longevity: 109 mo
Battery Voltage: 3 V
Brady Statistic AP VP Percent: 20.83 %
Brady Statistic AP VS Percent: 0.02 %
Brady Statistic AS VP Percent: 77.32 %
Brady Statistic AS VS Percent: 1.84 %
Brady Statistic RA Percent Paced: 20.36 %
Brady Statistic RV Percent Paced: 96.69 %
Date Time Interrogation Session: 20230215012404
HighPow Impedance: 69 Ohm
Implantable Lead Implant Date: 20140129
Implantable Lead Implant Date: 20140129
Implantable Lead Location: 753859
Implantable Lead Location: 753860
Implantable Lead Model: 180
Implantable Lead Model: 5076
Implantable Lead Serial Number: 310373
Implantable Pulse Generator Implant Date: 20210818
Lead Channel Impedance Value: 399 Ohm
Lead Channel Impedance Value: 513 Ohm
Lead Channel Impedance Value: 513 Ohm
Lead Channel Pacing Threshold Amplitude: 0.5 V
Lead Channel Pacing Threshold Amplitude: 0.5 V
Lead Channel Pacing Threshold Pulse Width: 0.4 ms
Lead Channel Pacing Threshold Pulse Width: 0.4 ms
Lead Channel Sensing Intrinsic Amplitude: 1.375 mV
Lead Channel Sensing Intrinsic Amplitude: 1.375 mV
Lead Channel Sensing Intrinsic Amplitude: 10 mV
Lead Channel Sensing Intrinsic Amplitude: 10 mV
Lead Channel Setting Pacing Amplitude: 2 V
Lead Channel Setting Pacing Amplitude: 2 V
Lead Channel Setting Pacing Pulse Width: 0.4 ms
Lead Channel Setting Sensing Sensitivity: 0.3 mV

## 2021-03-23 ENCOUNTER — Ambulatory Visit (INDEPENDENT_AMBULATORY_CARE_PROVIDER_SITE_OTHER): Payer: Medicare HMO

## 2021-03-23 ENCOUNTER — Other Ambulatory Visit: Payer: Self-pay

## 2021-03-23 DIAGNOSIS — I428 Other cardiomyopathies: Secondary | ICD-10-CM | POA: Diagnosis not present

## 2021-03-23 LAB — ECHOCARDIOGRAM COMPLETE
Area-P 1/2: 2.87 cm2
S' Lateral: 2.6 cm

## 2021-03-26 NOTE — Progress Notes (Signed)
Cardiology Office Note Date:  03/26/2021  Patient ID:  Michelle, Castro 18-Sep-1950, MRN [id number] PCP:  Emmaline Kluver, MD  Electrophysiologist: Dr. Caryl Comes    Chief Complaint:  device programming, pre-op  History of Present Illness: Michelle Castro is a 71 y.o. female with history of  VT/VF with long QT, HTN, breast cancer (L mastectomy), known anomalous take off of the RCA from [facility], Afib, NICM, RBBB  She comes in today to be seen for Dr. Caryl Comes, last seen by him Nov 2021. Discusses development of CP, diaphoresis with rate response, planned to get coronary CTa and echo  Coronary CT score of zero (report below), echo with LVEF 45-50%, grade I DD, WMA c/w LBBB/pacing  She was seen as an urgent clinic visit by Dr. Quentin Ore 03/17/21 with symptoms of dizziness, devic remote noted a VT episode in the monitor zone, 44 seconds with significant symptoms. Unclear trigger, reported compliance with medicines, no new meds, or or changes. Noting though an increased RV pacing burden. VT zone adjusted and added therapies in this zone as well. Planned to get labs/lytes, and echo To keep her appointment as scheduled with recommendations to try and program to reduce RV pacing if able.  Echo 03/23/21 remained essentially unchanged from Jan 2022, LVEF 45-50% with no WMA Labs looked OK, though BNP elevated 814  TODAY She is doing well, no recurrent symptoms of her VT She denies any CP, SOB, DOE No near syncope or syncope outside of the VT event   She mentions the day she had VT she thinks she had really reached a emotional/physical/mental limits with some significant personal stressors. Explains that she is caring mostly for her grandchildren, with their parents wrapped up in drugs, one in prison, she is also cares for her elderly aunt who is blind. This can be and got very overwhelming for her that day particularly and thinks that is why she had the tachycardia   Device information MDT dual  chamber ICD implanted 03/05/2012, gen change 09/23/2019 + hx of appropriate therapies 2016, had appropriate tx for VT, ran out of her inderal   Past Medical History:  Diagnosis Date   Anomalous coronary artery origin    RCA come off the left coronary cusp   Arthritis    Breast cancer ([place]) 1986   s/p :L mastectomy   Dementia ([place])    Depression/ anxiety    Hypertension    Long Q-T syndrome    gene testing pending   PAF (paroxysmal atrial fibrillation) ([place])    not well documented   PFO (patent foramen ovale)    Syncope    Ventricular tachycardia, polymorphic    long-short    Past Surgical History:  Procedure Laterality Date   ABDOMINAL HYSTERECTOMY     AUGMENTATION MAMMAPLASTY     BREAST BIOPSY Right    BREAST SURGERY     mastectomy   ICD GENERATOR CHANGEOUT N/A 09/23/2019   Procedure: ICD [place];  Surgeon: Deboraha Sprang, MD;  Location: [facility];  Service: Cardiovascular;  Laterality: N/A;   IMPLANTABLE CARDIOVERTER DEFIBRILLATOR IMPLANT N/A 03/05/2012   MDT dual chamber ICD implanted for secondary prevention for Long QT syndrome   MASTECTOMY Left    TOTAL KNEE ARTHROPLASTY     bilateral    Current Outpatient Medications  Medication Sig Dispense Refill   b complex vitamins capsule Take 1 capsule by mouth daily.     dicyclomine (BENTYL) 20 MG tablet Take 20 mg by  mouth 4 (four) times daily as needed (stomach pain).      LORazepam (ATIVAN) 1 MG tablet Take 0.5-1 tablets by mouth at bedtime as needed for anxiety or sleep. May take during the day as well     meloxicam (MOBIC) 15 MG tablet Take 1 tablet by mouth daily.     metoprolol tartrate (LOPRESSOR) 25 MG tablet Take 1 tablet by mouth 2 hours prior to Cardiac CT (Patient not taking: Reported on 03/17/2021) 1 tablet 0   Multiple Vitamin (MULTIVITAMIN WITH MINERALS) TABS Take 1 tablet by mouth daily.     propranolol ER (INDERAL LA) 120 MG 24 hr capsule Take 2 capsules by mouth in the a.m., take 1  capsule by mouth every evening. 270 capsule 3   spironolactone (ALDACTONE) 25 MG tablet TAKE 1/2 TABLET EVERY DAY 45 tablet 3   tolterodine (DETROL LA) 4 MG 24 hr capsule Take 4 mg by mouth daily.     valACYclovir (VALTREX) 500 MG tablet Take 500 mg by mouth daily.     venlafaxine XR (EFFEXOR-XR) 37.5 MG 24 hr capsule Take 37.5 mg by mouth daily with breakfast.     No current facility-administered medications for this visit.    Allergies:   Codeine   Social History:  The patient  reports that she has never smoked. She has never used smokeless tobacco. She reports that she does not drink alcohol and does not use drugs.   Family History:  The patient's family history includes Cancer in her brother and father; Hypertension in her father.  ROS:  Please see the history of present illness.    All other systems are reviewed and otherwise negative.   PHYSICAL EXAM:  VS:  There were no vitals taken for this visit. BMI: There is no height or weight on file to calculate BMI. Well nourished, well developed, in no acute distress HEENT: normocephalic, atraumatic Neck: no JVD, carotid bruits or masses Cardiac:  RRR; no significant murmurs, no rubs, or gallops Lungs:  CTA b/l, no wheezing, rhonchi or rales Abd: soft, nontender MS: no deformity or atrophy Ext: no edema Skin: warm and dry, no rash Neuro:  No gross deficits appreciated Psych: euthymic mood, full affect  ICD site is stable, no tethering or discomfort   EKG:  done today and reviewed by myself AV paced, QTc 429 (QRS 139)  Device interrogation done today and reviewed by myself:  Battery and lead measurements are good Presenting is AS/VS with a PR of 225ms Her AV paced AV delat on her EKG is the same She is programmed MVP VP% since last 68.1% OptiVol looks great  I pushed out her  PAV to 227ms  SAV 282ms   03/23/21: TTE IMPRESSIONS   1. Left ventricular ejection fraction, by estimation, is 45 to 50%. The  left ventricle  has mildly decreased function. The left ventricle has no  regional wall motion abnormalities. Left ventricular diastolic parameters  are consistent with Grade I  diastolic dysfunction (impaired relaxation).   2. Right ventricular systolic function is normal. The right ventricular  size is normal.   3. Left atrial size was mildly dilated.   4. The mitral valve is normal in structure. Mild to moderate mitral valve  regurgitation. No evidence of mitral stenosis.   5. The aortic valve is normal in structure. Aortic valve regurgitation is  not visualized. No aortic stenosis is present.   6. The inferior vena cava is normal in size with greater than 50%  respiratory variability, suggesting right atrial pressure of 3 mmHg.    01/26/20, TTE IMPRESSIONS   1. Left ventricular ejection fraction, by estimation, is 45 to 50%. The  left ventricle has mildly decreased function. The left ventricle has no  regional wall motion abnormalities. Left ventricular diastolic parameters  are consistent with Grade I  diastolic dysfunction (impaired relaxation).   2. Right ventricular systolic function is normal. The right ventricular  size is normal. There is normal pulmonary artery systolic pressure. The  estimated right ventricular systolic pressure is 92.3 mmHg.   3. Left atrial size was mildly dilated.   4. The mitral valve is grossly normal. Mild mitral valve regurgitation.  No evidence of mitral stenosis.   5. The aortic valve is tricuspid. Aortic valve regurgitation is not  visualized. No aortic stenosis is present.   6. The inferior vena cava is normal in size with greater than 50%  respiratory variability, suggesting right atrial pressure of 3 mmHg.   Comparison(s): No significant change from prior study. EF similar ~45-50%.  WMA related to pacing/LBBB.   02/25/20: coronary CT IMPRESSION: 1. No evidence of CAD, CADRADS = 0. There is a small portion of the proximal-mid RCA that has interference  from the device lead and cannot be evaluated.   2. Coronary calcium score of 0. This was 0 percentile for age and sex matched control.   3. Normal coronary origin with right dominance.   Recent Labs: 03/17/2021: BUN 16; Creatinine, Ser 1.14; Magnesium 2.2; NT-Pro BNP 814; Potassium 4.5; Sodium 137  No results found for requested labs within last 8760 hours.   Estimated Creatinine Clearance: 53.5 mL/min (A) (by C-G formula based on SCr of 1.14 mg/dL (H)).   Wt Readings from Last 3 Encounters:  03/17/21 215 lb 3.2 oz (97.6 kg)  12/30/19 217 lb 3.2 oz (98.5 kg)  09/23/19 220 lb (99.8 kg)     Other studies reviewed: Additional studies/records reviewed today include: summarized above  ASSESSMENT AND PLAN:  ICD Intact function Programming as above  VT, long QT None further QT looks ok On inderal  HTN No changes today  4. NICM OptiVol looks good No symptoms or exam findings of volume OL  Disposition: F/u with [place] in a few weeks, would like to make sure her rhythm has settled prior to pursuing foot surgery  Current medicines are reviewed at length with the patient today.  The patient did not have any concerns regarding medicines.  Venetia Night, PA-C 03/26/2021 11:56 AM     [facility] [address] [place] [address] [phone] (office)  [phone] (fax)

## 2021-03-27 NOTE — Progress Notes (Signed)
Remote ICD transmission.   

## 2021-03-28 DIAGNOSIS — M1811 Unilateral primary osteoarthritis of first carpometacarpal joint, right hand: Secondary | ICD-10-CM | POA: Diagnosis not present

## 2021-03-29 ENCOUNTER — Other Ambulatory Visit: Payer: Self-pay

## 2021-03-29 ENCOUNTER — Encounter: Payer: Self-pay | Admitting: Physician Assistant

## 2021-03-29 ENCOUNTER — Ambulatory Visit: Payer: Medicare HMO | Admitting: Physician Assistant

## 2021-03-29 VITALS — BP 140/72 | HR 63 | Ht 67.0 in | Wt 211.6 lb

## 2021-03-29 DIAGNOSIS — Z79899 Other long term (current) drug therapy: Secondary | ICD-10-CM

## 2021-03-29 DIAGNOSIS — I472 Ventricular tachycardia, unspecified: Secondary | ICD-10-CM | POA: Diagnosis not present

## 2021-03-29 DIAGNOSIS — Z95810 Presence of automatic (implantable) cardiac defibrillator: Secondary | ICD-10-CM

## 2021-03-29 DIAGNOSIS — I428 Other cardiomyopathies: Secondary | ICD-10-CM

## 2021-03-29 DIAGNOSIS — Z01818 Encounter for other preprocedural examination: Secondary | ICD-10-CM | POA: Diagnosis not present

## 2021-03-29 DIAGNOSIS — I10 Essential (primary) hypertension: Secondary | ICD-10-CM | POA: Diagnosis not present

## 2021-03-29 DIAGNOSIS — I4581 Long QT syndrome: Secondary | ICD-10-CM | POA: Diagnosis not present

## 2021-03-29 LAB — CUP PACEART INCLINIC DEVICE CHECK
Battery Remaining Longevity: 110 mo
Battery Voltage: 3 V
Brady Statistic AP VP Percent: 13.14 %
Brady Statistic AP VS Percent: 12.54 %
Brady Statistic AS VP Percent: 56.7 %
Brady Statistic AS VS Percent: 17.61 %
Brady Statistic RA Percent Paced: 24.37 %
Brady Statistic RV Percent Paced: 68.09 %
Date Time Interrogation Session: 20230222130927
HighPow Impedance: 90 Ohm
Implantable Lead Implant Date: 20140129
Implantable Lead Implant Date: 20140129
Implantable Lead Location: 753859
Implantable Lead Location: 753860
Implantable Lead Model: 180
Implantable Lead Model: 5076
Implantable Lead Serial Number: 310373
Implantable Pulse Generator Implant Date: 20210818
Lead Channel Impedance Value: 494 Ohm
Lead Channel Impedance Value: 532 Ohm
Lead Channel Impedance Value: 532 Ohm
Lead Channel Pacing Threshold Amplitude: 0.5 V
Lead Channel Pacing Threshold Amplitude: 0.625 V
Lead Channel Pacing Threshold Pulse Width: 0.4 ms
Lead Channel Pacing Threshold Pulse Width: 0.4 ms
Lead Channel Sensing Intrinsic Amplitude: 2.25 mV
Lead Channel Sensing Intrinsic Amplitude: 3.375 mV
Lead Channel Sensing Intrinsic Amplitude: 8.25 mV
Lead Channel Sensing Intrinsic Amplitude: 9.375 mV
Lead Channel Setting Pacing Amplitude: 2 V
Lead Channel Setting Pacing Amplitude: 2 V
Lead Channel Setting Pacing Pulse Width: 0.4 ms
Lead Channel Setting Sensing Sensitivity: 0.3 mV

## 2021-03-29 NOTE — Patient Instructions (Addendum)
Medication Instructions:   Your physician recommends that you continue on your current medications as directed. Please refer to the Current Medication list given to you today.  *If you need a refill on your cardiac medications before your next appointment, please call your pharmacy*   Lab Work: [place]   If you have labs (blood work) drawn today and your tests are completely normal, you will receive your results only by: [place] (if you have MyChart) OR A paper copy in the mail If you have any lab test that is abnormal or we need to change your treatment, we will call you to review the results.   Testing/Procedures: NONE ORDERED  TODAY    Follow-Up: At [facility], you and your health needs are our priority.  As part of our continuing mission to provide you with exceptional heart care, we have created designated Provider Care Teams.  These Care Teams include your primary Cardiologist (physician) and Advanced Practice Providers (APPs -  Physician Assistants and Nurse Practitioners) who all work together to provide you with the care you need, when you need it.  We recommend signing up for the patient portal called "MyChart".  Sign up information is provided on this After Visit Summary.  MyChart is used to connect with patients for Virtual Visits (Telemedicine).  Patients are able to view lab/test results, encounter notes, upcoming appointments, etc.  Non-urgent messages can be sent to your provider as well.   To learn more about what you can do with MyChart, go to [email or web address].    Your next appointment:   2 -4 week(s) ( CONTACT [facility] FOR EP SCHEDULING ISSUES )   The format for your next appointment:   In Person  Provider:   You will see one of the following Advanced Practice Providers on your designated Care Team:   Tommye Standard, [place] Legrand Como "Oda Kilts, [place]  {  Other Instructions:

## 2021-04-17 ENCOUNTER — Other Ambulatory Visit: Payer: Self-pay | Admitting: Cardiology

## 2021-04-17 ENCOUNTER — Ambulatory Visit (INDEPENDENT_AMBULATORY_CARE_PROVIDER_SITE_OTHER): Payer: Medicare HMO

## 2021-04-17 DIAGNOSIS — Z95810 Presence of automatic (implantable) cardiac defibrillator: Secondary | ICD-10-CM | POA: Diagnosis not present

## 2021-04-17 DIAGNOSIS — I428 Other cardiomyopathies: Secondary | ICD-10-CM | POA: Diagnosis not present

## 2021-04-19 ENCOUNTER — Telehealth: Payer: Self-pay

## 2021-04-19 NOTE — Telephone Encounter (Signed)
Remote ICM transmission received.  Attempted call to patient regarding ICM remote transmission and left detailed message per DPR.  Advised to return call for any fluid symptoms or questions. Next ICM remote transmission scheduled 05/22/2021.   ? ?

## 2021-04-19 NOTE — Progress Notes (Signed)
EPIC Encounter for ICM Monitoring ? ?Patient Name: Michelle Castro is a 71 y.o. female ?Date: 04/19/2021 ?Primary Care Physican: Street, [name], MD ?Primary Cardiologist: Caryl Comes ?Electrophysiologist: Caryl Comes ?12/09/2020 Office Weight: 218 lbs ?  ?Time in AT/AF  0.0 hr/day (0.0%) ?                                                          ?  ?Attempted call to patient and unable to reach.  Left detailed message per DPR regarding transmission. Transmission reviewed.  ?  ?Optivol thoracic impedance suggesting normal fluid levels.   ?  ?Prescribed:  ?Spironolactone 25 mg take 0.5 tablet (12.5 mg total) by mouth daily. ?  ?Recommendations:  Left voice mail with ICM number and encouraged to call if experiencing any fluid symptoms. ?  ?Follow-up plan: ICM clinic phone appointment on 05/22/2021.   91 day device clinic remote transmission 06/21/2021.   ?  ?EP/Cardiology Office Visits:  3/22/203 with Tommye Standard, PA.  ?  ?Copy of ICM check sent to Dr. Caryl Comes.    ? ?3 month ICM trend: 04/18/2021. ? ? ? ?12-14 Month ICM trend:  ? ? ? ?Rosalene Billings, RN ?04/19/2021 ?2:07 PM ? ?

## 2021-04-23 NOTE — Progress Notes (Signed)
? ?Cardiology Office Note ?Date:  04/23/2021  ?Patient ID:  Michelle Castro, DOB [birth year], MRN [id number] ?PCP:  Street, [name], MD  ?Electrophysiologist: Dr. Caryl Comes ? ?  ?Chief Complaint:   planned f/u ? ?History of Present Illness: ?Michelle Castro is a 71 y.o. female with history of  VT/VF with long QT, HTN, breast cancer (L mastectomy), known anomalous take off of the RCA from [facility], Afib, NICM, RBBB ? ?She comes in today to be seen for Dr. Caryl Comes, last seen by him Nov 2021. ?Discusses development of CP, diaphoresis with rate response, planned to get coronary CTa and echo ? ?Coronary CT score of zero (report below), echo with LVEF 45-50%, grade I DD, WMA c/w LBBB/pacing ? ?She was seen as an urgent clinic visit by Dr. Quentin Ore 03/17/21 with symptoms of dizziness, devic remote noted a VT episode in the monitor zone, 44 seconds with significant symptoms. ?Unclear trigger, reported compliance with medicines, no new meds, or or changes. ?Noting though an increased RV pacing burden. ?VT zone adjusted and added therapies in this zone as well. ?Planned to get labs/lytes, and echo ?To keep her appointment as scheduled with recommendations to try and program to reduce RV pacing if able. ? ?Echo 03/23/21 remained essentially unchanged from Jan 2022, LVEF 45-50% with no WMA ?Labs looked OK, though BNP elevated 814 ? ?I saw her 03/29/21 ?She is doing well, no recurrent symptoms of her VT ?She denies any CP, SOB, DOE ?No near syncope or syncope outside of the VT event  ?She mentions the day she had VT she thinks she had really reached a emotional/physical/mental limits with some significant personal stressors. ?Explains that she is caring mostly for her grandchildren, with their parents wrapped up in drugs, one in prison, she is also cares for her elderly aunt who is blind. ?This can be and got very overwhelming for her that day particularly and thinks that is why she had the tachycardia ? ?Her VP% was up and moved out her AV  delays ?Had not had any further VT ? ? ?TODAY ?Pending bunion surgery ?RCRI is zero, 0.4%, perhaps one with mild CM, though no CHF, would be 0.9% ? ?She has decided to hold off on the foot surgery for now. ? ?All in all doing well still. ?Occasional awareness of her heart beat, particularly when walking. ?No CP, no symptoms of her VT ?No near syncope or syncope. ?No SOB ? ? ?Device information ?MDT dual chamber ICD implanted 03/05/2012, gen change 09/23/2019 ?+ hx of appropriate therapies ?2016, had appropriate tx for VT, ran out of her inderal ?Feb 2023, VT in monitor zone ? ? ?Past Medical History:  ?Diagnosis Date  ? Anomalous coronary artery origin   ? RCA come off the left coronary cusp  ? Arthritis   ? Breast cancer ([place]) 1986  ? s/p :L mastectomy  ? Dementia ([place])   ? Depression/ anxiety   ? Hypertension   ? Long Q-T syndrome   ? gene testing pending  ? PAF (paroxysmal atrial fibrillation) ([place])   ? not well documented  ? PFO (patent foramen ovale)   ? Syncope   ? Ventricular tachycardia, polymorphic   ? long-short  ? ? ?Past Surgical History:  ?Procedure Laterality Date  ? ABDOMINAL HYSTERECTOMY    ? AUGMENTATION MAMMAPLASTY    ? BREAST BIOPSY Right   ? BREAST SURGERY    ? mastectomy  ? ICD GENERATOR CHANGEOUT N/A 09/23/2019  ? Procedure: ICD GENERATOR CHANGEOUT;  Surgeon:  Deboraha Sprang, MD;  Location: [facility];  Service: Cardiovascular;  Laterality: N/A;  ? IMPLANTABLE CARDIOVERTER DEFIBRILLATOR IMPLANT N/A 03/05/2012  ? MDT dual chamber ICD implanted for secondary prevention for Long QT syndrome  ? MASTECTOMY Left   ? TOTAL KNEE ARTHROPLASTY    ? bilateral  ? ? ?Current Outpatient Medications  ?Medication Sig Dispense Refill  ? b complex vitamins capsule Take 1 capsule by mouth daily.    ? dicyclomine (BENTYL) 20 MG tablet Take 20 mg by mouth 4 (four) times daily as needed (stomach pain).     ? LORazepam (ATIVAN) 1 MG tablet Take 0.5-1 tablets by mouth at bedtime as needed for anxiety or sleep. May  take during the day as well    ? meloxicam (MOBIC) 15 MG tablet Take 1 tablet by mouth daily.    ? Multiple Vitamin (MULTIVITAMIN WITH MINERALS) TABS Take 1 tablet by mouth daily.    ? propranolol ER (INDERAL LA) 120 MG 24 hr capsule Take 2 capsules by mouth in the a.m., take 1 capsule by mouth every evening. 270 capsule 3  ? spironolactone (ALDACTONE) 25 MG tablet TAKE 1/2 TABLET EVERY DAY 45 tablet 3  ? tolterodine (DETROL LA) 4 MG 24 hr capsule Take 4 mg by mouth daily.    ? valACYclovir (VALTREX) 500 MG tablet Take 500 mg by mouth daily.    ? venlafaxine XR (EFFEXOR-XR) 37.5 MG 24 hr capsule Take 37.5 mg by mouth daily with breakfast.    ? ?No current facility-administered medications for this visit.  ? ? ?Allergies:   Codeine  ? ?Social History:  The patient  reports that she has never smoked. She has never used smokeless tobacco. She reports that she does not drink alcohol and does not use drugs.  ? ?Family History:  The patient's family history includes Cancer in her brother and father; Hypertension in her father. ? ?ROS:  Please see the history of present illness.    ?All other systems are reviewed and otherwise negative.  ? ?PHYSICAL EXAM:  ?VS:  There were no vitals taken for this visit. BMI: There is no height or weight on file to calculate BMI. ?Well nourished, well developed, in no acute distress ?HEENT: normocephalic, atraumatic ?Neck: no JVD, carotid bruits or masses ?Cardiac:  RRR; no significant murmurs, no rubs, or gallops ?Lungs:  CTA b/l, no wheezing, rhonchi or rales ?Abd: soft, nontender ?MS: no deformity or atrophy ?Ext: no edema ?Skin: warm and dry, no rash ?Neuro:  No gross deficits appreciated ?Psych: euthymic mood, full affect ? ?ICD site is stable, no tethering or discomfort ? ? ?EKG:  done today and reviewed by myself ?SR/Vpaced 67bpm, QTc 475m (not accounting for paced QRS 1728m ? ?Device interrogation done today and reviewed by myself:  ?Battery and lead measurements are good ?No  VT ?No VS episodes ?VP 71.4% ?With industry, moved PAV to 350, SAV to 320, required reduction of sensor rate to 115 does not have rate response on) ?Base HR rarely >80 ?I noted that with this she had PACs, ectopy and unusual pacing behavior ?Moved PAV to 320 and SAV to 290 ? ? ?03/23/21: TTE ?IMPRESSIONS  ? 1. Left ventricular ejection fraction, by estimation, is 45 to 50%. The  ?left ventricle has mildly decreased function. The left ventricle has no  ?regional wall motion abnormalities. Left ventricular diastolic parameters  ?are consistent with Grade I  ?diastolic dysfunction (impaired relaxation).  ? 2. Right ventricular systolic function  is normal. The right ventricular  ?size is normal.  ? 3. Left atrial size was mildly dilated.  ? 4. The mitral valve is normal in structure. Mild to moderate mitral valve  ?regurgitation. No evidence of mitral stenosis.  ? 5. The aortic valve is normal in structure. Aortic valve regurgitation is  ?not visualized. No aortic stenosis is present.  ? 6. The inferior vena cava is normal in size with greater than 50%  ?respiratory variability, suggesting right atrial pressure of 3 mmHg.  ? ? ?01/26/20, TTE ?IMPRESSIONS  ? 1. Left ventricular ejection fraction, by estimation, is 45 to 50%. The  ?left ventricle has mildly decreased function. The left ventricle has no  ?regional wall motion abnormalities. Left ventricular diastolic parameters  ?are consistent with Grade I  ?diastolic dysfunction (impaired relaxation).  ? 2. Right ventricular systolic function is normal. The right ventricular  ?size is normal. There is normal pulmonary artery systolic pressure. The  ?estimated right ventricular systolic pressure is 37.3 mmHg.  ? 3. Left atrial size was mildly dilated.  ? 4. The mitral valve is grossly normal. Mild mitral valve regurgitation.  ?No evidence of mitral stenosis.  ? 5. The aortic valve is tricuspid. Aortic valve regurgitation is not  ?visualized. No aortic stenosis is present.   ? 6. The inferior vena cava is normal in size with greater than 50%  ?respiratory variability, suggesting right atrial pressure of 3 mmHg.  ? ?Comparison(s): No significant change from prior study. EF simi

## 2021-04-26 ENCOUNTER — Encounter: Payer: Self-pay | Admitting: Physician Assistant

## 2021-04-26 ENCOUNTER — Ambulatory Visit: Payer: Medicare HMO | Admitting: Physician Assistant

## 2021-04-26 ENCOUNTER — Other Ambulatory Visit: Payer: Self-pay

## 2021-04-26 VITALS — BP 116/62 | HR 74 | Ht 67.0 in | Wt 210.0 lb

## 2021-04-26 DIAGNOSIS — I428 Other cardiomyopathies: Secondary | ICD-10-CM | POA: Diagnosis not present

## 2021-04-26 DIAGNOSIS — Z95810 Presence of automatic (implantable) cardiac defibrillator: Secondary | ICD-10-CM

## 2021-04-26 DIAGNOSIS — I472 Ventricular tachycardia, unspecified: Secondary | ICD-10-CM

## 2021-04-26 DIAGNOSIS — I10 Essential (primary) hypertension: Secondary | ICD-10-CM

## 2021-04-26 DIAGNOSIS — I4581 Long QT syndrome: Secondary | ICD-10-CM

## 2021-04-26 LAB — CUP PACEART INCLINIC DEVICE CHECK
Battery Remaining Longevity: 108 mo
Battery Voltage: 3.01 V
Brady Statistic AP VP Percent: 18.31 %
Brady Statistic AP VS Percent: 12.32 %
Brady Statistic AS VP Percent: 55.29 %
Brady Statistic AS VS Percent: 14.08 %
Brady Statistic RA Percent Paced: 29.02 %
Brady Statistic RV Percent Paced: 71.36 %
Date Time Interrogation Session: 20230322134149
HighPow Impedance: 87 Ohm
Implantable Lead Implant Date: 20140129
Implantable Lead Implant Date: 20140129
Implantable Lead Location: 753859
Implantable Lead Location: 753860
Implantable Lead Model: 180
Implantable Lead Model: 5076
Implantable Lead Serial Number: 310373
Implantable Pulse Generator Implant Date: 20210818
Lead Channel Impedance Value: 437 Ohm
Lead Channel Impedance Value: 532 Ohm
Lead Channel Impedance Value: 570 Ohm
Lead Channel Pacing Threshold Amplitude: 0.5 V
Lead Channel Pacing Threshold Amplitude: 0.625 V
Lead Channel Pacing Threshold Pulse Width: 0.4 ms
Lead Channel Pacing Threshold Pulse Width: 0.4 ms
Lead Channel Sensing Intrinsic Amplitude: 1.5 mV
Lead Channel Sensing Intrinsic Amplitude: 10.125 mV
Lead Channel Sensing Intrinsic Amplitude: 3.625 mV
Lead Channel Sensing Intrinsic Amplitude: 8.375 mV
Lead Channel Setting Pacing Amplitude: 2 V
Lead Channel Setting Pacing Amplitude: 2 V
Lead Channel Setting Pacing Pulse Width: 0.4 ms
Lead Channel Setting Sensing Sensitivity: 0.3 mV

## 2021-04-26 NOTE — Patient Instructions (Addendum)
Medication Instructions:  ? ?Your physician recommends that you continue on your current medications as directed. Please refer to the Current Medication list given to you today. ? ?*If you need a refill on your cardiac medications before your next appointment, please call your pharmacy* ? ? ?Lab Work: [place] ? ? ?If you have labs (blood work) drawn today and your tests are completely normal, you will receive your results only by: ?MyChart Message (if you have MyChart) OR ?A paper copy in the mail ?If you have any lab test that is abnormal or we need to change your treatment, we will call you to review the results. ? ? ?Testing/Procedures: NONE ORDERED  TODAY ? ? ? ? ?Follow-Up: ?At [facility], you and your health needs are our priority.  As part of our continuing mission to provide you with exceptional heart care, we have created designated Provider Care Teams.  These Care Teams include your primary Cardiologist (physician) and Advanced Practice Providers (APPs -  Physician Assistants and Nurse Practitioners) who all work together to provide you with the care you need, when you need it. ? ?We recommend signing up for the patient portal called "MyChart".  Sign up information is provided on this After Visit Summary.  MyChart is used to connect with patients for Virtual Visits (Telemedicine).  Patients are able to view lab/test results, encounter notes, upcoming appointments, etc.  Non-urgent messages can be sent to your provider as well.   ?To learn more about what you can do with MyChart, go to [email or web address].   ? ?Your next appointment:   ?4 month(s) ( CONTACT [facility] FOR EP SCHEDULING ISSUES ) ? ? ?The format for your next appointment:   ?In Person ? ?Provider:   ?You may see Dr. Caryl Comes  or one of the following Advanced Practice Providers on your designated Care Team:   ?Tommye Standard, PA-C ? ? ? ?Other Instructions ? ?

## 2021-04-27 NOTE — Addendum Note (Signed)
Addended by: Claude Manges on: 04/27/2021 02:38 PM ? ? Modules accepted: Orders ? ?

## 2021-04-28 ENCOUNTER — Telehealth: Payer: Self-pay | Admitting: Physician Assistant

## 2021-04-28 NOTE — Telephone Encounter (Signed)
New message ? ? ?Pt said she had PPM adjustments with Renee the other day and is still having real bad SOB. She is unsure if it's from the adjustment but would like to discuss.  ?

## 2021-04-28 NOTE — Telephone Encounter (Signed)
Spoke to the patient. ?She mentions that she had some SOB at the time of our visit, that she thought was her allergies, yesterday seemed to notice more SOB particularly when walking and feeling like her legs were weak, feeling better when seated. ?No CP, but perhaps an awareness of her heart beat, not fast, not irregular. ?NO lightheadedness, no near syncope or syncope. ?She was at her daughter's house who is a MA, checked her pulse was 90, she has not checked her BP ?At rest she feels OK ?She wonders if it may be anxiety as well. ?She was planning on going home soon and will senda transmission that I will follow up on. ?I discussed that I would not have ex[ected her to have any symptoms with the change I made at her visit to her device, though we could program her back to her previousl settings if needed. ? ?Will await her transmission ?She was given ER precautions if any escalation or ongoing symptoms ? ?Tommye Standard, PA-C ?

## 2021-04-28 NOTE — Telephone Encounter (Signed)
Spoke with patient and patient is currently still having shortness of breath. Patient  has been having on off episodes since yesterday. She was in the store shopping and notice she was out of breath. Patient had to stop and take deep breaths to catch a breath. Patient stated she remember she came to office visit and her device was looked at and the thought came to her  mind possibly could have been that but not for sure. Patient is currently not having chest pain or dizziness. Patient stated she didn't feel at this time she needed EMS.Patient states she feels better when she sits down and catches her breath. Patient feels it could be allergies or anxiety. Patient while on phone stated she went outside to get a breath of air and was still feeling okay.Patient is currently staying  at a family member house in [facility] which is 45 minutes away from her home. Patient was told that was a good idea so that this matter can assist with to see what could be reccommended.      ?

## 2021-04-28 NOTE — Telephone Encounter (Signed)
Attempted to reach patient about shortness of breath. Patient still not answering ?

## 2021-04-28 NOTE — Telephone Encounter (Signed)
No transmission received yet  ?Called left a message to follow up with the patient ?Left a message for the patient that we had not yet received the transmission ?If she was feeling poorly and felt she needed to be seen, to go seek attention. ? ?Tommye Standard, PA-C ? ?

## 2021-04-30 ENCOUNTER — Telehealth: Payer: Self-pay | Admitting: Physician Assistant

## 2021-04-30 NOTE — Telephone Encounter (Signed)
Spoke to the patient ?She is feeling much better. ?Was getting an error with her transmitter and unable to get a transmission through. ?I told her I would ask the device clinic staff to reach out to her tomorrow. ? ?Tommye Standard, PA-C ?

## 2021-05-02 ENCOUNTER — Telehealth: Payer: Self-pay

## 2021-05-02 DIAGNOSIS — R1031 Right lower quadrant pain: Secondary | ICD-10-CM | POA: Diagnosis not present

## 2021-05-02 NOTE — Telephone Encounter (Signed)
I spoke with the patient and called [employer] tech support. They are sending the patient a new handheld in 7-10 business days. Pt states she felt fine.  ?

## 2021-05-02 NOTE — Telephone Encounter (Signed)
-----   Message from Simone Curia, RN sent at 05/01/2021  1:42 PM EDT ----- ?Could someone please have this patient send a transmission? ? ?Thanks, ?Leigh ?----- Message ----- ?From: Baldwin Jamaica, PA-C ?Sent: 04/28/2021   7:50 PM EDT ?To: Claude Manges, CMA, [facility] ? ?No transmission as of 7:48PM. ? ?I looked at 6pm, called her left a message that we had not gotten her transmission, advised if she was feeling poorly to seek attention. ? ?Please follow up with her  ? ?Thanks ? ?renee ? ? ?

## 2021-05-10 NOTE — Telephone Encounter (Signed)
LMOvm to follow up to see if the patient received new handheld. ?

## 2021-05-10 NOTE — Telephone Encounter (Signed)
Pt states she received her new handheld and agreed to send a manual transmission when she get home. I told her when I see it, I will let Renee know. She states she has been feeling much better.  ?

## 2021-05-11 NOTE — Telephone Encounter (Signed)
Transmission received 05/10/2021. ?

## 2021-05-22 ENCOUNTER — Ambulatory Visit (INDEPENDENT_AMBULATORY_CARE_PROVIDER_SITE_OTHER): Payer: Medicare HMO

## 2021-05-22 DIAGNOSIS — Z95810 Presence of automatic (implantable) cardiac defibrillator: Secondary | ICD-10-CM | POA: Diagnosis not present

## 2021-05-22 DIAGNOSIS — I428 Other cardiomyopathies: Secondary | ICD-10-CM | POA: Diagnosis not present

## 2021-05-23 ENCOUNTER — Telehealth: Payer: Self-pay

## 2021-05-23 NOTE — Progress Notes (Signed)
EPIC Encounter for ICM Monitoring ? ?Patient Name: Michelle Castro is a 71 y.o. female ?Date: 05/23/2021 ?Primary Care Physican: Street, [name], MD ?Primary Cardiologist: Caryl Comes ?Electrophysiologist: Caryl Comes ?04/26/2021 Office Weight: 210 lbs ?  ?Time in AT/AF  0.0 hr/day (0.0%) ?                                                          ?  ?Attempted call to patient and unable to reach.  Left detailed message per DPR regarding transmission. Transmission reviewed.  ?  ?Optivol thoracic impedance suggesting possible fluid accumulation starting 3/27 with a few days at baseline.  Fluid index crossed normal threshold 4/13. ?  ?Prescribed:  ?Spironolactone 25 mg take 0.5 tablet (12.5 mg total) by mouth daily. ?  ?Labs: ?03/17/2021 Creatinine 1.14, BUN 16, Potassium 4.5, Sodium 137, GFR 51 ? ?Recommendations:  Left voice mail with ICM number and encouraged to call if experiencing any fluid symptoms. ?  ?Follow-up plan: ICM clinic phone appointment on 05/29/2021 to recheck fluid levels.   91 day device clinic remote transmission 06/21/2021.   ?  ?EP/Cardiology Office Visits:  08/30/2021 with Dr Caryl Comes.  ?  ?Copy of ICM check sent to Dr. Caryl Comes.    ? ?3 month ICM trend: 05/22/2021. ? ? ? ?12-14 Month ICM trend:  ? ? ? ?Rosalene Billings, RN ?05/23/2021 ?1:28 PM ? ?

## 2021-05-23 NOTE — Telephone Encounter (Signed)
Remote ICM transmission received.  Attempted call to patient regarding ICM remote transmission and left detailed message per DPR.  Advised to return call for any fluid symptoms or questions. Next ICM remote transmission scheduled 05/29/2021.   ? ?

## 2021-05-29 ENCOUNTER — Ambulatory Visit (INDEPENDENT_AMBULATORY_CARE_PROVIDER_SITE_OTHER): Payer: Medicare HMO

## 2021-05-29 DIAGNOSIS — Z95810 Presence of automatic (implantable) cardiac defibrillator: Secondary | ICD-10-CM

## 2021-05-29 DIAGNOSIS — I428 Other cardiomyopathies: Secondary | ICD-10-CM

## 2021-05-29 NOTE — Progress Notes (Signed)
EPIC Encounter for ICM Monitoring ? ?Patient Name: Michelle Castro is a 71 y.o. female ?Date: 05/29/2021 ?Primary Care Physican: Street, [name], MD ?Primary Cardiologist: Caryl Comes ?Electrophysiologist: Caryl Comes ?04/26/2021 Office Weight: 210 lbs ?  ?Time in AT/AF  0.0 hr/day (0.0%) ?                                                          ?  ?Attempted call to patient and unable to reach.  Left detailed message per DPR regarding transmission. Transmission reviewed.  ?  ?Optivol thoracic impedance suggesting possible fluid accumulation starting 3/27 with a few days at baseline.  Fluid index crossed normal threshold 4/13. ?  ?Prescribed:  ?Spironolactone 25 mg take 0.5 tablet (12.5 mg total) by mouth daily. ?  ?Labs: ?03/17/2021 Creatinine 1.14, BUN 16, Potassium 4.5, Sodium 137, GFR 51 ?  ?Recommendations:  Left voice mail with ICM number and encouraged to call if experiencing any fluid symptoms. ?  ?Follow-up plan: ICM clinic phone appointment on 06/12/2021 to recheck fluid levels.   91 day device clinic remote transmission 06/21/2021.   ?  ?EP/Cardiology Office Visits:  08/30/2021 with Dr Caryl Comes.  ?  ?Copy of ICM check sent to Dr. Caryl Comes.   ? ?3 month ICM trend: 05/29/2021. ? ? ? ?12-14 Month ICM trend:  ? ? ? ?Rosalene Billings, RN ?05/29/2021 ?9:18 AM ? ?

## 2021-05-30 ENCOUNTER — Telehealth: Payer: Self-pay

## 2021-05-30 NOTE — Telephone Encounter (Signed)
Remote ICM transmission received.  Attempted call to patient regarding ICM remote transmission and left detailed message per DPR to return call.  Advised to return call for any fluid symptoms or questions.      

## 2021-06-12 ENCOUNTER — Ambulatory Visit (INDEPENDENT_AMBULATORY_CARE_PROVIDER_SITE_OTHER): Payer: Medicare HMO

## 2021-06-12 DIAGNOSIS — Z95810 Presence of automatic (implantable) cardiac defibrillator: Secondary | ICD-10-CM

## 2021-06-12 DIAGNOSIS — I428 Other cardiomyopathies: Secondary | ICD-10-CM

## 2021-06-14 NOTE — Progress Notes (Signed)
EPIC Encounter for ICM Monitoring ? ?Patient Name: Michelle Castro is a 71 y.o. female ?Date: 06/14/2021 ?Primary Care Physican: Street, [name], MD ?Primary Cardiologist: Caryl Comes ?Electrophysiologist: Caryl Comes ?04/26/2021 Office Weight: 210 lbs ?  ?Time in AT/AF  0.0 hr/day (0.0%) ?                                                          ?  ?Transmission reviewed.  ?  ?Optivol thoracic impedance suggesting fluid levels returned to normal. ?  ?Prescribed:  ?Spironolactone 25 mg take 0.5 tablet (12.5 mg total) by mouth daily. ?  ?Labs: ?03/17/2021 Creatinine 1.14, BUN 16, Potassium 4.5, Sodium 137, GFR 51 ?  ?Recommendations: No Changes. ?  ?Follow-up plan: ICM clinic phone appointment on 07/04/2021.   91 day device clinic remote transmission 06/21/2021.   ?  ?EP/Cardiology Office Visits:  08/30/2021 with Dr Caryl Comes.  ?  ?Copy of ICM check sent to Dr. Caryl Comes.   ? ?3 month ICM trend: 06/12/2021. ? ? ? ?12-14 Month ICM trend:  ? ? ? ?Rosalene Billings, RN ?06/14/2021 ?2:14 PM ? ?

## 2021-06-21 ENCOUNTER — Ambulatory Visit (INDEPENDENT_AMBULATORY_CARE_PROVIDER_SITE_OTHER): Payer: Medicare HMO

## 2021-06-21 DIAGNOSIS — I428 Other cardiomyopathies: Secondary | ICD-10-CM

## 2021-06-21 DIAGNOSIS — I472 Ventricular tachycardia, unspecified: Secondary | ICD-10-CM

## 2021-06-22 LAB — CUP PACEART REMOTE DEVICE CHECK
Battery Remaining Longevity: 105 mo
Battery Voltage: 3.01 V
Brady Statistic AP VP Percent: 0.04 %
Brady Statistic AP VS Percent: 29.75 %
Brady Statistic AS VP Percent: 0.46 %
Brady Statistic AS VS Percent: 69.75 %
Brady Statistic RA Percent Paced: 28.35 %
Brady Statistic RV Percent Paced: 0.51 %
Date Time Interrogation Session: 20230517001805
HighPow Impedance: 76 Ohm
Implantable Lead Implant Date: 20140129
Implantable Lead Implant Date: 20140129
Implantable Lead Location: 753859
Implantable Lead Location: 753860
Implantable Lead Model: 180
Implantable Lead Model: 5076
Implantable Lead Serial Number: 310373
Implantable Pulse Generator Implant Date: 20210818
Lead Channel Impedance Value: 380 Ohm
Lead Channel Impedance Value: 513 Ohm
Lead Channel Impedance Value: 513 Ohm
Lead Channel Pacing Threshold Amplitude: 0.5 V
Lead Channel Pacing Threshold Amplitude: 1 V
Lead Channel Pacing Threshold Pulse Width: 0.4 ms
Lead Channel Pacing Threshold Pulse Width: 0.4 ms
Lead Channel Sensing Intrinsic Amplitude: 1.5 mV
Lead Channel Sensing Intrinsic Amplitude: 1.5 mV
Lead Channel Sensing Intrinsic Amplitude: 7.5 mV
Lead Channel Sensing Intrinsic Amplitude: 7.5 mV
Lead Channel Setting Pacing Amplitude: 2 V
Lead Channel Setting Pacing Amplitude: 2 V
Lead Channel Setting Pacing Pulse Width: 0.4 ms
Lead Channel Setting Sensing Sensitivity: 0.3 mV

## 2021-07-04 ENCOUNTER — Telehealth: Payer: Self-pay

## 2021-07-04 ENCOUNTER — Ambulatory Visit (INDEPENDENT_AMBULATORY_CARE_PROVIDER_SITE_OTHER): Payer: Medicare HMO

## 2021-07-04 DIAGNOSIS — I428 Other cardiomyopathies: Secondary | ICD-10-CM | POA: Diagnosis not present

## 2021-07-04 DIAGNOSIS — Z95810 Presence of automatic (implantable) cardiac defibrillator: Secondary | ICD-10-CM

## 2021-07-04 NOTE — Telephone Encounter (Signed)
Remote ICM transmission received.  Attempted call to patient regarding ICM remote transmission and left detailed message per DPR.  Advised to return call for any fluid symptoms or questions. Next ICM remote transmission scheduled 07/11/2021.

## 2021-07-04 NOTE — Progress Notes (Signed)
EPIC Encounter for ICM Monitoring  Patient Name: Aaleah Hirsch is a 71 y.o. female Date: 07/04/2021 Primary Care Physican: Street, [name], MD Primary Cardiologist: Caryl Comes Electrophysiologist: Caryl Comes 04/26/2021 Office Weight: 210 lbs   Time in AT/AF  0.0 hr/day (0.0%)                                                             Attempted call to patient and unable to reach.  Left detailed message per DPR regarding transmission. Transmission reviewed.    Optivol thoracic impedance suggesting possible fluid accumulation starting 5/23.   Prescribed:  Spironolactone 25 mg take 0.5 tablet (12.5 mg total) by mouth daily.   Labs: 03/17/2021 Creatinine 1.14, BUN 16, Potassium 4.5, Sodium 137, GFR 51   Recommendations: Left voice mail with ICM number and encouraged to call if experiencing any fluid symptoms.   Follow-up plan: ICM clinic phone appointment on 07/11/2021 to recheck fluid levels.   91 day device clinic remote transmission 09/20/2021.     EP/Cardiology Office Visits:  08/30/2021 with Dr Caryl Comes.    Copy of ICM check sent to Dr. Caryl Comes.    3 month ICM trend: 07/04/2021.    12-14 Month ICM trend:     Rosalene Billings, RN 07/04/2021 10:36 AM

## 2021-07-04 NOTE — Progress Notes (Signed)
Remote ICD transmission.   

## 2021-07-12 NOTE — Progress Notes (Signed)
No ICM remote transmission received for 07/11/2021 and next ICM transmission scheduled for 07/24/2021.

## 2021-07-26 ENCOUNTER — Telehealth: Payer: Self-pay

## 2021-07-26 NOTE — Telephone Encounter (Signed)
I spoke with patient about missed ICM transmission. Michelle Castro states she is visiting her daughter and forgot her monitor. I told her Margarita Grizzle will reschedule her appointment.

## 2021-07-27 NOTE — Progress Notes (Signed)
No ICM remote transmission received for 07/24/2021 and next ICM transmission scheduled for 08/09/2021.

## 2021-08-09 ENCOUNTER — Ambulatory Visit (INDEPENDENT_AMBULATORY_CARE_PROVIDER_SITE_OTHER): Payer: Medicare HMO

## 2021-08-09 DIAGNOSIS — I428 Other cardiomyopathies: Secondary | ICD-10-CM

## 2021-08-09 DIAGNOSIS — Z95810 Presence of automatic (implantable) cardiac defibrillator: Secondary | ICD-10-CM | POA: Diagnosis not present

## 2021-08-09 NOTE — Progress Notes (Signed)
EPIC Encounter for ICM Monitoring  Patient Name: Michelle Castro is a 71 y.o. female Date: 08/09/2021 Primary Care Physican: Street, [name], MD Primary Cardiologist: Caryl Comes Electrophysiologist: Caryl Comes 04/26/2021 Office Weight: 210 lbs   Time in AT/AF  0.0 hr/day (0.0%)                                                             Transmission reviewed.    Optivol thoracic impedance normal but was suggesting possible fluid accumulation intermittently within the last month.   Prescribed:  Spironolactone 25 mg take 0.5 tablet (12.5 mg total) by mouth daily.   Labs: 03/17/2021 Creatinine 1.14, BUN 16, Potassium 4.5, Sodium 137, GFR 51   Recommendations: No changes.   Follow-up plan: ICM clinic phone appointment on 09/11/2021.   91 day device clinic remote transmission 09/20/2021.     EP/Cardiology Office Visits:  08/30/2021 with Dr Caryl Comes.    Copy of ICM check sent to Dr. Caryl Comes.     3 month ICM trend: 08/09/2021.    12-14 Month ICM trend:     Rosalene Billings, RN 08/09/2021 1:35 PM

## 2021-08-21 ENCOUNTER — Other Ambulatory Visit: Payer: Self-pay | Admitting: Internal Medicine

## 2021-08-30 ENCOUNTER — Encounter: Payer: Self-pay | Admitting: Internal Medicine

## 2021-08-30 ENCOUNTER — Ambulatory Visit (INDEPENDENT_AMBULATORY_CARE_PROVIDER_SITE_OTHER): Payer: Medicare HMO | Admitting: Internal Medicine

## 2021-08-30 VITALS — BP 112/74 | HR 62 | Ht 67.0 in | Wt 218.6 lb

## 2021-08-30 DIAGNOSIS — I428 Other cardiomyopathies: Secondary | ICD-10-CM | POA: Diagnosis not present

## 2021-08-30 DIAGNOSIS — I4581 Long QT syndrome: Secondary | ICD-10-CM

## 2021-08-30 DIAGNOSIS — I4729 Other ventricular tachycardia: Secondary | ICD-10-CM | POA: Diagnosis not present

## 2021-08-30 DIAGNOSIS — Z95810 Presence of automatic (implantable) cardiac defibrillator: Secondary | ICD-10-CM

## 2021-08-30 DIAGNOSIS — R072 Precordial pain: Secondary | ICD-10-CM

## 2021-08-30 LAB — CUP PACEART INCLINIC DEVICE CHECK
Battery Remaining Longevity: 103 mo
Battery Voltage: 2.98 V
Brady Statistic AP VP Percent: 11.64 %
Brady Statistic AP VS Percent: 24.15 %
Brady Statistic AS VP Percent: 22.07 %
Brady Statistic AS VS Percent: 42.14 %
Brady Statistic RA Percent Paced: 34.42 %
Brady Statistic RV Percent Paced: 33.42 %
Date Time Interrogation Session: 20230726172732
HighPow Impedance: 75 Ohm
Implantable Lead Implant Date: 20140129
Implantable Lead Implant Date: 20140129
Implantable Lead Location: 753859
Implantable Lead Location: 753860
Implantable Lead Model: 180
Implantable Lead Model: 5076
Implantable Lead Serial Number: 310373
Implantable Pulse Generator Implant Date: 20210818
Lead Channel Impedance Value: 437 Ohm
Lead Channel Impedance Value: 513 Ohm
Lead Channel Impedance Value: 532 Ohm
Lead Channel Pacing Threshold Amplitude: 0.5 V
Lead Channel Pacing Threshold Amplitude: 0.75 V
Lead Channel Pacing Threshold Pulse Width: 0.4 ms
Lead Channel Pacing Threshold Pulse Width: 0.4 ms
Lead Channel Sensing Intrinsic Amplitude: 1.25 mV
Lead Channel Sensing Intrinsic Amplitude: 2.875 mV
Lead Channel Sensing Intrinsic Amplitude: 7.375 mV
Lead Channel Sensing Intrinsic Amplitude: 9.125 mV
Lead Channel Setting Pacing Amplitude: 2 V
Lead Channel Setting Pacing Amplitude: 2 V
Lead Channel Setting Pacing Pulse Width: 0.4 ms
Lead Channel Setting Sensing Sensitivity: 0.3 mV

## 2021-08-30 MED ORDER — EMPAGLIFLOZIN 10 MG PO TABS: 10.0000 mg | ORAL_TABLET | Freq: Every day | ORAL | 11 refills | Status: DC

## 2021-08-30 NOTE — Patient Instructions (Addendum)
Medication Instructions:  Your physician has recommended you make the following change in your medication:   ** Begin Jardiance '10mg'$  - 1 tablet by mouth daily before breakfast.  *If you need a refill on your cardiac medications before your next appointment, please call your pharmacy*   Lab Work: CBC and Lipid Panel If you have labs (blood work) drawn today and your tests are completely normal, you will receive your results only by: [place] (if you have MyChart) OR A paper copy in the mail If you have any lab test that is abnormal or we need to change your treatment, we will call you to review the results.   Testing/Procedures: Your physician has requested that you have cardiac CT. Cardiac computed tomography (CT) is a painless test that uses an x-ray machine to take clear, detailed pictures of your heart. For further information please visit [email or web address]. Please follow instruction sheet as given.     Follow-Up: At [facility], you and your health needs are our priority.  As part of our continuing mission to provide you with exceptional heart care, we have created designated Provider Care Teams.  These Care Teams include your primary Cardiologist (physician) and Advanced Practice Providers (APPs -  Physician Assistants and Nurse Practitioners) who all work together to provide you with the care you need, when you need it.  We recommend signing up for the patient portal called "MyChart".  Sign up information is provided on this After Visit Summary.  MyChart is used to connect with patients for Virtual Visits (Telemedicine).  Patients are able to view lab/test results, encounter notes, upcoming appointments, etc.  Non-urgent messages can be sent to your provider as well.   To learn more about what you can do with MyChart, go to [email or web address].    Your next appointment:   3 months with Newt Lukes   Other Instructions   Your cardiac CT will be scheduled at  one of the below locations:   [facility] [address] [place], [place] [address] [phone]  If scheduled at [facility], please arrive at the [facility] and Children's Entrance (Entrance C2) of [facility] 30 minutes prior to test start time. You can use the FREE valet parking offered at entrance C (encouraged to control the heart rate for the test)  Proceed to the [facility] Radiology Department (first floor) to check-in and test prep.  All radiology patients and guests should use entrance C2 at [facility], accessed from [facility], even though the hospital's physical address listed is [address].     Please follow these instructions carefully (unless otherwise directed):  Hold all erectile dysfunction medications at least 3 days (72 hrs) prior to test.  On the Night Before the Test: Be sure to Drink plenty of water. Do not consume any caffeinated/decaffeinated beverages or chocolate 12 hours prior to your test. Do not take any antihistamines 12 hours prior to your test.   On the Day of the Test: Drink plenty of water until 1 hour prior to the test. Do not eat any food 4 hours prior to the test. You may take your regular medications prior to the test.  Take metoprolol (Lopressor) two hours prior to test. HOLD Furosemide/Hydrochlorothiazide morning of the test. FEMALES- please wear underwire-free bra if available, avoid dresses & tight clothing       After the Test: Drink plenty of water. After receiving IV contrast, you may experience a mild  flushed feeling. This is normal. On occasion, you may experience a mild rash up to 24 hours after the test. This is not dangerous. If this occurs, you can take Benadryl 25 mg and increase your fluid intake. If you experience trouble breathing, this can be serious. If it is severe call 911 IMMEDIATELY. If it is mild, please call our office. If you take any of these  medications: Glipizide/Metformin, Avandament, Glucavance, please do not take 48 hours after completing test unless otherwise instructed.  We will call to schedule your test 2-4 weeks out understanding that some insurance companies will need an authorization prior to the service being performed.   For non-scheduling related questions, please contact the cardiac imaging nurse navigator should you have any questions/concerns: Marchia Bond, Cardiac Imaging Nurse Navigator Gordy Clement, Cardiac Imaging Nurse Navigator [facility] Direct Office Dial: [phone]   For scheduling needs, including cancellations and rescheduling, please call [place], [phone].   Important Information About Sugar

## 2021-08-30 NOTE — Progress Notes (Signed)
Patient Care Team: Street, Michelle Mt, MD as PCP - General   HPI  Michelle Castro is a 71 y.o. female Seen following the history of atrial fibrillation modest nonischemic cardiomyopathy  Has hx of  polymorphic ventricular tachycardia. QT prolongation was noted with normal electrolytes. Sertraline was thought to be potentially contributing; polymorphic VT persisted despite its discontinuation  she underwent ICD implantation and generator replacement 8/21  Gene testing was apparently started but it was done through her sister;  result were negative  Hx of ICD shock associated of polymorphic ventricular tachycardia/fibrillation which occurred while at rest. She had run out of her propranolol prior to this..  She also was, upon interrogation, noted to have an episode of self terminating polymorphic VT 2/16.   2/23 developed monomorphic ventricular tachycardia and her device was reprogrammed to minimize ventricular pacing  74>.33%   The patient denies nocturnal dyspnea, orthopnea or peripheral edema.  There have been no palpitations, lightheadedness or syncope.  Complains of dyspnea on exertion frequently accompanying chest discomfort which she describes as a pressure unassociated with radiation but accompanied by diaphoresis.  Duration typically minutes relieved by rest.  Increasingly problematic over recent months.Marland Kitchen   DATE TEST EF   7/17 Echo   40-45 %   11/18 CT  CaScore 0  2/23 Echo  45-50%        Date Cr K Hgb  2/20 0.98 4.9    11/20 1.1 4.7 13.1(8/21)   2/23 1.14 4.5          Her daughter died of breast cancer 25-Dec-2017.  There are 2 children, Michelle Castro and Michelle Castro.  Husband's name is Michelle Castro.   All are still struggling.  The children visited the summer 2022    Past Medical History:  Diagnosis Date   Anomalous coronary artery origin    RCA come off the left coronary cusp   Arthritis    Breast cancer ([place]) 1986   s/p :L mastectomy   Dementia ([place])    Depression/  anxiety    Hypertension    Long Q-T syndrome    gene testing pending   PAF (paroxysmal atrial fibrillation) ([place])    not well documented   PFO (patent foramen ovale)    Syncope    Ventricular tachycardia, polymorphic ([place])    long-short    Past Surgical History:  Procedure Laterality Date   ABDOMINAL HYSTERECTOMY     AUGMENTATION MAMMAPLASTY     BREAST BIOPSY Right    BREAST SURGERY     mastectomy   ICD GENERATOR CHANGEOUT N/A 09/23/2019   Procedure: ICD [employer];  Surgeon: Deboraha Sprang, MD;  Location: [facility];  Service: Cardiovascular;  Laterality: N/A;   IMPLANTABLE CARDIOVERTER DEFIBRILLATOR IMPLANT N/A 03/05/2012   MDT dual chamber ICD implanted for secondary prevention for Long QT syndrome   MASTECTOMY Left    TOTAL KNEE ARTHROPLASTY     bilateral    Current Outpatient Medications  Medication Sig Dispense Refill   b complex vitamins capsule Take 1 capsule by mouth daily.     dicyclomine (BENTYL) 20 MG tablet Take 20 mg by mouth 4 (four) times daily as needed (stomach pain).      LORazepam (ATIVAN) 1 MG tablet Take 0.5-1 tablets by mouth at bedtime as needed for anxiety or sleep. May take during the day as well     meloxicam (MOBIC) 15 MG tablet Take 1 tablet by mouth daily.  Multiple Vitamin (MULTIVITAMIN WITH MINERALS) TABS Take 1 tablet by mouth daily.     propranolol ER (INDERAL LA) 120 MG 24 hr capsule TAKE TWO CAPSULES (240 MG) IN THE MORNING AND ONE CAPSULE (120 MG) BY MOUTH EVERY EVENING 270 capsule 3   spironolactone (ALDACTONE) 25 MG tablet TAKE 1/2 TABLET EVERY DAY 45 tablet 3   tolterodine (DETROL LA) 4 MG 24 hr capsule Take 4 mg by mouth daily.     valACYclovir (VALTREX) 500 MG tablet Take 500 mg by mouth daily.     venlafaxine XR (EFFEXOR-XR) 37.5 MG 24 hr capsule Take 37.5 mg by mouth daily with breakfast.     No current facility-administered medications for this visit.    Allergies  Allergen Reactions   Codeine Nausea Only     Review of Systems negative except from HPI and PMH  Physical Exam BP 112/74   Pulse 62   Ht '5\' 7"'$  (1.702 m)   Wt 218 lb 9.6 oz (99.2 kg)   BMI 34.24 kg/m  Well developed and well nourished in no acute distress HENT normal Neck supple with JVP-flat Clear Device pocket well healed; without hematoma or erythema.  There is no tethering  Regular rate and rhythm, no  murmur Abd-soft with active BS No Clubbing cyanosis  edema Skin-warm and dry A & Oriented  Grossly normal sensory and motor function  ECG sinus rhythm at 62 15/15/45 Right bundle branch block left axis deviation   Assessment and  Plan  Long QT syndrome-probable  Ventricular tachycardia-nonsustained-polymorphic  Ventricular tachycardia-monomorphic-sustained  ICD implantation-[employer] device    Nonischemic cardiomyopathy  Psychosocial Stress  Right bundle branch block left axis deviation  Sustained monomorphic ventricular tachycardia in 2/23, this is obviously not associated with her long QT syndrome presumed diagnosis which was supported by her prior history of polymorphic VT for which she had therapy.  With her history of exercise intolerance and chest discomfort, raises the possibility about whether there is been interval development of coronary disease since calcium score 2018.  We will undertake CTA.  No significant wall motion abnormality differences were appreciated on the echo 2023.  There is some degree of changes in thoracic impedance.  We will keep an eye on this with a repeat transmission in about 3 weeks.  We will hold off on diuretics.  Encouraged her to decrease sodium.  We will check her hemoglobin given her symptoms and the use of nonsteroidal. While taking Mobic we will hold off on aspirin

## 2021-08-31 LAB — CBC
Hematocrit: 39.8 % (ref 34.0–46.6)
Hemoglobin: 13.3 g/dL (ref 11.1–15.9)
MCH: 29.7 pg (ref 26.6–33.0)
MCHC: 33.4 g/dL (ref 31.5–35.7)
MCV: 89 fL (ref 79–97)
Platelets: 305 10*3/uL (ref 150–450)
RBC: 4.48 x10E6/uL (ref 3.77–5.28)
RDW: 12.5 % (ref 11.7–15.4)
WBC: 9.1 10*3/uL (ref 3.4–10.8)

## 2021-08-31 LAB — LIPID PANEL
Chol/HDL Ratio: 5.3 ratio — ABNORMAL HIGH (ref 0.0–4.4)
Cholesterol, Total: 208 mg/dL — ABNORMAL HIGH (ref 100–199)
HDL: 39 mg/dL — ABNORMAL LOW (ref >39)
LDL Chol Calc (NIH): 136 mg/dL — ABNORMAL HIGH (ref 0–99)
Triglycerides: 181 mg/dL — ABNORMAL HIGH (ref 0–149)
VLDL Cholesterol Cal: 33 mg/dL (ref 5–40)

## 2021-09-11 ENCOUNTER — Ambulatory Visit (INDEPENDENT_AMBULATORY_CARE_PROVIDER_SITE_OTHER): Payer: Medicare HMO

## 2021-09-11 DIAGNOSIS — I428 Other cardiomyopathies: Secondary | ICD-10-CM

## 2021-09-11 DIAGNOSIS — Z95810 Presence of automatic (implantable) cardiac defibrillator: Secondary | ICD-10-CM

## 2021-09-12 DIAGNOSIS — B029 Zoster without complications: Secondary | ICD-10-CM | POA: Diagnosis not present

## 2021-09-13 ENCOUNTER — Telehealth: Payer: Self-pay

## 2021-09-13 NOTE — Progress Notes (Signed)
EPIC Encounter for ICM Monitoring  Patient Name: Michelle Castro is a 71 y.o. female Date: 09/13/2021 Primary Care Physican: Street, [name], MD Primary Cardiologist: Caryl Comes Electrophysiologist: Caryl Comes 04/26/2021 Office Weight: 210 lbs   Time in AT/AF  0.0 hr/day (0.0%)                                                             Attempted call to patient and unable to reach.  Left detailed message per DPR regarding transmission. Transmission reviewed.    Optivol thoracic impedance normal but was suggesting intermittent days with possible fluid accumulation.   Prescribed:  Spironolactone 25 mg take 0.5 tablet (12.5 mg total) by mouth daily.   Labs: 03/17/2021 Creatinine 1.14, BUN 16, Potassium 4.5, Sodium 137, GFR 51   Recommendations:  Left voice mail with ICM number and encouraged to call if experiencing any fluid symptoms.   Follow-up plan: ICM clinic phone appointment on 10/16/2021.   91 day device clinic remote transmission 12/20/2021.     EP/Cardiology Office Visits:  12/04/2021 with Tommye Standard, PA.    Copy of ICM check sent to Dr. Caryl Comes.     3 month ICM trend: 09/11/2021.    12-14 Month ICM trend:     Rosalene Billings, RN 09/13/2021 10:25 AM

## 2021-09-13 NOTE — Telephone Encounter (Signed)
Remote ICM transmission received.  Attempted call to patient regarding ICM remote transmission and left detailed message per DPR.  Advised to return call for any fluid symptoms or questions. Next ICM remote transmission scheduled 10/16/2021.    

## 2021-09-18 ENCOUNTER — Encounter (HOSPITAL_COMMUNITY): Payer: Self-pay

## 2021-09-18 ENCOUNTER — Telehealth (HOSPITAL_COMMUNITY): Payer: Self-pay | Admitting: *Deleted

## 2021-09-18 NOTE — Telephone Encounter (Signed)
Attempted to call patient regarding upcoming cardiac CT appointment. °Left message on voicemail with name and callback number ° °Debar Plate RN Navigator Cardiac Imaging °[facility] °[phone] Office °[phone] Cell ° °

## 2021-09-20 ENCOUNTER — Ambulatory Visit (HOSPITAL_COMMUNITY)
Admission: RE | Admit: 2021-09-20 | Discharge: 2021-09-20 | Disposition: A | Discharge status: Home / Self Care | Payer: Medicare HMO | Source: Ambulatory Visit | Attending: Internal Medicine | Admitting: Internal Medicine

## 2021-09-20 ENCOUNTER — Ambulatory Visit (INDEPENDENT_AMBULATORY_CARE_PROVIDER_SITE_OTHER): Payer: Medicare HMO

## 2021-09-20 DIAGNOSIS — Z95810 Presence of automatic (implantable) cardiac defibrillator: Secondary | ICD-10-CM | POA: Insufficient documentation

## 2021-09-20 DIAGNOSIS — I428 Other cardiomyopathies: Secondary | ICD-10-CM

## 2021-09-20 DIAGNOSIS — R072 Precordial pain: Secondary | ICD-10-CM | POA: Insufficient documentation

## 2021-09-20 LAB — CUP PACEART REMOTE DEVICE CHECK
Battery Remaining Longevity: 103 mo
Battery Voltage: 3.01 V
Brady Statistic AP VP Percent: 3.5 %
Brady Statistic AP VS Percent: 32.58 %
Brady Statistic AS VP Percent: 31.84 %
Brady Statistic AS VS Percent: 32.07 %
Brady Statistic RA Percent Paced: 34.97 %
Brady Statistic RV Percent Paced: 35.08 %
Date Time Interrogation Session: 20230816001606
HighPow Impedance: 84 Ohm
Implantable Lead Implant Date: 20140129
Implantable Lead Implant Date: 20140129
Implantable Lead Location: 753859
Implantable Lead Location: 753860
Implantable Lead Model: 180
Implantable Lead Model: 5076
Implantable Lead Serial Number: 310373
Implantable Pulse Generator Implant Date: 20210818
Lead Channel Impedance Value: 437 Ohm
Lead Channel Impedance Value: 589 Ohm
Lead Channel Impedance Value: 589 Ohm
Lead Channel Pacing Threshold Amplitude: 0.5 V
Lead Channel Pacing Threshold Amplitude: 0.75 V
Lead Channel Pacing Threshold Pulse Width: 0.4 ms
Lead Channel Pacing Threshold Pulse Width: 0.4 ms
Lead Channel Sensing Intrinsic Amplitude: 1.625 mV
Lead Channel Sensing Intrinsic Amplitude: 1.625 mV
Lead Channel Sensing Intrinsic Amplitude: 8.125 mV
Lead Channel Sensing Intrinsic Amplitude: 8.125 mV
Lead Channel Setting Pacing Amplitude: 2 V
Lead Channel Setting Pacing Amplitude: 2 V
Lead Channel Setting Pacing Pulse Width: 0.4 ms
Lead Channel Setting Sensing Sensitivity: 0.3 mV

## 2021-09-20 LAB — POCT I-STAT CREATININE: Creatinine, Ser: 1.2 mg/dL — ABNORMAL HIGH (ref 0.44–1.00)

## 2021-09-20 MED ORDER — NITROGLYCERIN 0.4 MG SL SUBL
0.8000 mg | SUBLINGUAL_TABLET | Freq: Once | SUBLINGUAL | Status: AC
  Administered 2021-09-20: 0.8 mg via SUBLINGUAL

## 2021-09-20 MED ORDER — IOHEXOL 350 MG/ML SOLN
95.0000 mL | Freq: Once | INTRAVENOUS | Status: AC | PRN
  Administered 2021-09-20: 95 mL via INTRAVENOUS

## 2021-09-20 MED ORDER — NITROGLYCERIN 0.4 MG SL SUBL
SUBLINGUAL_TABLET | SUBLINGUAL | Status: AC
  Filled 2021-09-20: qty 2

## 2021-09-24 DIAGNOSIS — K122 Cellulitis and abscess of mouth: Secondary | ICD-10-CM | POA: Diagnosis not present

## 2021-10-16 ENCOUNTER — Ambulatory Visit (INDEPENDENT_AMBULATORY_CARE_PROVIDER_SITE_OTHER): Payer: Medicare HMO

## 2021-10-16 DIAGNOSIS — I428 Other cardiomyopathies: Secondary | ICD-10-CM | POA: Diagnosis not present

## 2021-10-16 DIAGNOSIS — Z95810 Presence of automatic (implantable) cardiac defibrillator: Secondary | ICD-10-CM

## 2021-10-18 DIAGNOSIS — M1811 Unilateral primary osteoarthritis of first carpometacarpal joint, right hand: Secondary | ICD-10-CM | POA: Diagnosis not present

## 2021-10-18 NOTE — Progress Notes (Signed)
EPIC Encounter for ICM Monitoring  Patient Name: Michelle Castro is a 71 y.o. female Date: 10/18/2021 Primary Care Physican: Street, [name], MD Primary Cardiologist: Caryl Comes Electrophysiologist: Caryl Comes 04/26/2021 Office Weight: 210 lbs   Time in AT/AF  0.0 hr/day (0.0%)                                                             Transmission reviewed.    Optivol thoracic impedance normal but was suggesting intermittent days with possible fluid accumulation.   Prescribed:  Spironolactone 25 mg take 0.5 tablet (12.5 mg total) by mouth daily.   Labs: 03/17/2021 Creatinine 1.14, BUN 16, Potassium 4.5, Sodium 137, GFR 51   Recommendations:  No changes.   Follow-up plan: ICM clinic phone appointment on 11/20/2021.   91 day device clinic remote transmission 12/20/2021.     EP/Cardiology Office Visits:  12/04/2021 with Tommye Standard, PA.    Copy of ICM check sent to Dr. Caryl Comes.   3 month ICM trend: 10/16/2021.    12-14 Month ICM trend:     Rosalene Billings, RN 10/18/2021 12:40 PM

## 2021-10-19 NOTE — Progress Notes (Signed)
Remote ICD transmission.   

## 2021-10-20 DIAGNOSIS — M19011 Primary osteoarthritis, right shoulder: Secondary | ICD-10-CM | POA: Diagnosis not present

## 2021-11-01 DIAGNOSIS — M5416 Radiculopathy, lumbar region: Secondary | ICD-10-CM | POA: Diagnosis not present

## 2021-11-14 DIAGNOSIS — M2011 Hallux valgus (acquired), right foot: Secondary | ICD-10-CM | POA: Diagnosis not present

## 2021-11-14 DIAGNOSIS — M2021 Hallux rigidus, right foot: Secondary | ICD-10-CM | POA: Diagnosis not present

## 2021-11-15 ENCOUNTER — Encounter: Payer: Self-pay | Admitting: Internal Medicine

## 2021-11-22 ENCOUNTER — Ambulatory Visit (INDEPENDENT_AMBULATORY_CARE_PROVIDER_SITE_OTHER): Payer: Medicare HMO

## 2021-11-22 DIAGNOSIS — Z95810 Presence of automatic (implantable) cardiac defibrillator: Secondary | ICD-10-CM

## 2021-11-22 DIAGNOSIS — I428 Other cardiomyopathies: Secondary | ICD-10-CM | POA: Diagnosis not present

## 2021-11-22 NOTE — Progress Notes (Signed)
EPIC Encounter for ICM Monitoring  Patient Name: Michelle Castro is a 71 y.o. female Date: 11/22/2021 Primary Care Physican: Street, [name], MD Primary Cardiologist: Caryl Comes Electrophysiologist: Caryl Comes 04/26/2021 Office Weight: 210 lbs   Time in AT/AF  0.0 hr/day (0.0%)                                                             Transmission reviewed.    Optivol thoracic impedance normal but was suggesting intermittent days with possible fluid accumulation.   Prescribed:  Spironolactone 25 mg take 0.5 tablet (12.5 mg total) by mouth daily.   Labs: 03/17/2021 Creatinine 1.14, BUN 16, Potassium 4.5, Sodium 137, GFR 51   Recommendations:  No changes.   Follow-up plan: ICM clinic phone appointment on 01/01/2022.   91 day device clinic remote transmission 12/20/2021.     EP/Cardiology Office Visits:  12/04/2021 with Tommye Standard, PA.    Copy of ICM check sent to Dr. Caryl Comes.   3 month ICM trend: 11/20/2021.    12-14 Month ICM trend:     Rosalene Billings, RN 11/22/2021 3:51 PM

## 2021-11-29 DIAGNOSIS — M5416 Radiculopathy, lumbar region: Secondary | ICD-10-CM | POA: Diagnosis not present

## 2021-12-03 NOTE — Progress Notes (Unsigned)
Cardiology Office Note Date:  12/03/2021  Patient ID:  Michelle Castro, Michelle Castro February 26, 1950, MRN [id number] PCP:  Emmaline Kluver, MD  Electrophysiologist: Dr. Caryl Comes    Chief Complaint:   planned f/u  History of Present Illness: Michelle Castro is a 71 y.o. female with history of  VT/VF with long QT, HTN, breast cancer (L mastectomy), known anomalous take off of the RCA from [facility], Afib, NICM, RBBB  She comes in today to be seen for Dr. Caryl Comes, last seen by him Nov 2021. Discusses development of CP, diaphoresis with rate response, planned to get coronary CTa and echo  Coronary CT score of zero (report below), echo with LVEF 45-50%, grade I DD, WMA c/w LBBB/pacing  She was seen as an urgent clinic visit by Dr. Quentin Ore 03/17/21 with symptoms of dizziness, devic remote noted a VT episode in the monitor zone, 44 seconds with significant symptoms. Unclear trigger, reported compliance with medicines, no new meds, or or changes. Noting though an increased RV pacing burden. VT zone adjusted and added therapies in this zone as well. Planned to get labs/lytes, and echo To keep her appointment as scheduled with recommendations to try and program to reduce RV pacing if able.  Echo 03/23/21 remained essentially unchanged from Jan 2022, LVEF 45-50% with no WMA Labs looked OK, though BNP elevated 814  I saw her 03/29/21 She is doing well, no recurrent symptoms of her VT She denies any CP, SOB, DOE No near syncope or syncope outside of the VT event  She mentions the day she had VT she thinks she had really reached a emotional/physical/mental limits with some significant personal stressors. Explains that she is caring mostly for her grandchildren, with their parents wrapped up in drugs, one in prison, she is also cares for her elderly aunt who is blind. This can be and got very overwhelming for her that day particularly and thinks that is why she had the tachycardia  Her VP% was up and moved out her AV  delays Had not had any further VT   I saw her 04/26/21 Pending bunion surgery RCRI is zero, 0.4%, perhaps one with mild CM, though no CHF, would be 0.9% She has decided to hold off on the foot surgery for now. All in all doing well still. Occasional awareness of her heart beat, particularly when walking. No CP, no symptoms of her VT No near syncope or syncope. No SOB Noted VP 71% and AV delays were extended to try and reduce VP% though this caused unusual pacing behavior and AV delays further adjusted to PAV to 320 and SAV to 290  Ditropan in d/w RPH low likely hood of QT prolongation, advised to inquire anyway if there may be an alternative for her She had been on effexor for years  She saw Dr. Caryl Comes 08/30/21, at this visit she c/o CP/SOB with increasing frequency. Planned for coronary CTa OptiVol up some, advised to reduce sodium Noted use of Mobic, planned o check CBC H/K were OK Ca++ score of zero, no CAD  TODAY She feels very well. She is not having any ongoing symptoms She is active, taking line dancing classes and loves them No difficulties with her ADLs, laundry, stairs, groceries, hours chores. NO CP, palpitations, SOB No dizziness, near syncope or syncope.  She is pending bunion surgery soon   Device information MDT dual chamber ICD implanted 03/05/2012, gen change 09/23/2019 + hx of appropriate therapies 2016, had appropriate tx for VT, ran out of her  inderal Feb 2023, VT in monitor zone   Past Medical History:  Diagnosis Date   Anomalous coronary artery origin    RCA come off the left coronary cusp   Arthritis    Breast cancer ([place]) 1986   s/p :L mastectomy   Dementia ([place])    Depression/ anxiety    Hypertension    Long Q-T syndrome    gene testing pending   PAF (paroxysmal atrial fibrillation) ([place])    not well documented   PFO (patent foramen ovale)    Syncope    Ventricular tachycardia, polymorphic ([place])    long-short    Past Surgical History:   Procedure Laterality Date   ABDOMINAL HYSTERECTOMY     AUGMENTATION MAMMAPLASTY     BREAST BIOPSY Right    BREAST SURGERY     mastectomy   ICD GENERATOR CHANGEOUT N/A 09/23/2019   Procedure: ICD Frio;  Surgeon: Deboraha Sprang, MD;  Location: [facility] [facility];  Service: Cardiovascular;  Laterality: N/A;   IMPLANTABLE CARDIOVERTER DEFIBRILLATOR IMPLANT N/A 03/05/2012   MDT dual chamber ICD implanted for secondary prevention for Long QT syndrome   MASTECTOMY Left    TOTAL KNEE ARTHROPLASTY     bilateral    Current Outpatient Medications  Medication Sig Dispense Refill   b complex vitamins capsule Take 1 capsule by mouth daily.     dicyclomine (BENTYL) 20 MG tablet Take 20 mg by mouth 4 (four) times daily as needed (stomach pain).      empagliflozin (JARDIANCE) 10 MG TABS tablet Take 1 tablet (10 mg total) by mouth daily before breakfast. 30 tablet 11   LORazepam (ATIVAN) 1 MG tablet Take 0.5-1 tablets by mouth at bedtime as needed for anxiety or sleep. May take during the day as well     meloxicam (MOBIC) 15 MG tablet Take 1 tablet by mouth daily.     Multiple Vitamin (MULTIVITAMIN WITH MINERALS) TABS Take 1 tablet by mouth daily.     propranolol ER (INDERAL LA) 120 MG 24 hr capsule TAKE TWO CAPSULES (240 MG) IN THE MORNING AND ONE CAPSULE (120 MG) BY MOUTH EVERY EVENING 270 capsule 3   spironolactone (ALDACTONE) 25 MG tablet TAKE 1/2 TABLET EVERY DAY 45 tablet 3   tolterodine (DETROL LA) 4 MG 24 hr capsule Take 4 mg by mouth daily.     valACYclovir (VALTREX) 500 MG tablet Take 500 mg by mouth daily.     venlafaxine XR (EFFEXOR-XR) 37.5 MG 24 hr capsule Take 37.5 mg by mouth daily with breakfast.     No current facility-administered medications for this visit.    Allergies:   Codeine   Social History:  The patient  reports that she has never smoked. She has never used smokeless tobacco. She reports that she does not drink alcohol and does not use drugs.   Family  History:  The patient's family history includes Cancer in her brother and father; Hypertension in her father.  ROS:  Please see the history of present illness.    All other systems are reviewed and otherwise negative.   PHYSICAL EXAM:  VS:  There were no vitals taken for this visit. BMI: There is no height or weight on file to calculate BMI. Well nourished, well developed, in no acute distress HEENT: normocephalic, atraumatic Neck: no JVD, carotid bruits or masses Cardiac:  RRR; no significant murmurs, no rubs, or gallops Lungs:  CTA b/l, no wheezing, rhonchi or rales Abd: soft, nontender MS: no  deformity or atrophy Ext: no edema Skin: warm and dry, no rash Neuro:  No gross deficits appreciated Psych: euthymic mood, full affect  ICD site is stable, no tethering or discomfort   EKG:  done today and reviewed by myself SR 63bpm, PVC, QTc 474m, RBBB, her last EKG was A paced, otherwise appears unchanged   Device interrogation done today and reviewed by myself:  Battery and lead measurements are good No AFib NSVT, markers with only a couple EGMs, look MM No sustained VT 23.7% AP 44% VP  09/20/21 coronary CT IMPRESSION: 1. Coronary calcium score of 0. This was 1st percentile for age, sex, and race matched control. 2. Normal coronary origin with right dominance. 3. CAD-RADS 0. No evidence of CAD (0%). Consider non-atherosclerotic causes of chest pain. 4.  No significant changes from 02/25/2020 study.  03/23/21: TTE IMPRESSIONS   1. Left ventricular ejection fraction, by estimation, is 45 to 50%. The  left ventricle has mildly decreased function. The left ventricle has no  regional wall motion abnormalities. Left ventricular diastolic parameters  are consistent with Grade I  diastolic dysfunction (impaired relaxation).   2. Right ventricular systolic function is normal. The right ventricular  size is normal.   3. Left atrial size was mildly dilated.   4. The mitral valve is  normal in structure. Mild to moderate mitral valve  regurgitation. No evidence of mitral stenosis.   5. The aortic valve is normal in structure. Aortic valve regurgitation is  not visualized. No aortic stenosis is present.   6. The inferior vena cava is normal in size with greater than 50%  respiratory variability, suggesting right atrial pressure of 3 mmHg.    01/26/20, TTE IMPRESSIONS   1. Left ventricular ejection fraction, by estimation, is 45 to 50%. The  left ventricle has mildly decreased function. The left ventricle has no  regional wall motion abnormalities. Left ventricular diastolic parameters  are consistent with Grade I  diastolic dysfunction (impaired relaxation).   2. Right ventricular systolic function is normal. The right ventricular  size is normal. There is normal pulmonary artery systolic pressure. The  estimated right ventricular systolic pressure is 141.9mmHg.   3. Left atrial size was mildly dilated.   4. The mitral valve is grossly normal. Mild mitral valve regurgitation.  No evidence of mitral stenosis.   5. The aortic valve is tricuspid. Aortic valve regurgitation is not  visualized. No aortic stenosis is present.   6. The inferior vena cava is normal in size with greater than 50%  respiratory variability, suggesting right atrial pressure of 3 mmHg.   Comparison(s): No significant change from prior study. EF similar ~45-50%.  WMA related to pacing/LBBB.   02/25/20: coronary CT IMPRESSION: 1. No evidence of CAD, CADRADS = 0. There is a small portion of the proximal-mid RCA that has interference from the device lead and cannot be evaluated.   2. Coronary calcium score of 0. This was 0 percentile for age and sex matched control.   3. Normal coronary origin with right dominance.   Recent Labs: 03/17/2021: BUN 16; Magnesium 2.2; NT-Pro BNP 814; Potassium 4.5; Sodium 137 08/30/2021: Hemoglobin 13.3; Platelets 305 09/20/2021: Creatinine, Ser 1.20   08/30/2021: Chol/HDL Ratio 5.3; Cholesterol, Total 208; HDL 39; LDL Chol Calc (NIH) 136; Triglycerides 181   CrCl cannot be calculated (Patient's most recent lab result is older than the maximum 21 days allowed.).   Wt Readings from Last 3 Encounters:  08/30/21 218 lb 9.6 oz (  99.2 kg)  04/26/21 210 lb (95.3 kg)  03/29/21 211 lb 9.6 oz (96 kg)     Other studies reviewed: Additional studies/records reviewed today include: summarized above  ASSESSMENT AND PLAN:  ICD Intact function No programming changes made   VT, long QT QT looks ok On inderal She is on Detrol LAand is working well for her urinary incontinance I reached out to [facility], not a strong offender of QT She has been on effexor for years  She is instructed to make sure her surgeon/anesthesia team is aware of her long QT history and device. Low cardiac risk surgery, discussed that she is felt to be an acceptable cardiac risk for foot surgery, but not zero. She understands, and reports that she has nmade all of her doctor s aware of her device and hx  HTN No changes today  4. NICM No symptoms or exam findings of volume OL  5. AFib is mentioned in her hx Zero burden She reports historically having been on a blood thinner, but stopped years ago.     Disposition: remotes as usual, back in cl inic in 28mo sooner if needed  Current medicines are reviewed at length with the patient today.  The patient did not have any concerns regarding medicines.  SVenetia Night PA-C 12/03/2021 12:27 PM     [facility] [phone](office)  [phone](fax)

## 2021-12-04 ENCOUNTER — Encounter: Payer: Self-pay | Admitting: Physician Assistant

## 2021-12-04 ENCOUNTER — Ambulatory Visit: Payer: Medicare HMO | Attending: Physician Assistant | Admitting: Physician Assistant

## 2021-12-04 VITALS — BP 130/88 | HR 63 | Ht 67.0 in | Wt 215.0 lb

## 2021-12-04 DIAGNOSIS — Z79899 Other long term (current) drug therapy: Secondary | ICD-10-CM | POA: Diagnosis not present

## 2021-12-04 DIAGNOSIS — I472 Ventricular tachycardia, unspecified: Secondary | ICD-10-CM | POA: Diagnosis not present

## 2021-12-04 DIAGNOSIS — I10 Essential (primary) hypertension: Secondary | ICD-10-CM

## 2021-12-04 DIAGNOSIS — I4581 Long QT syndrome: Secondary | ICD-10-CM | POA: Diagnosis not present

## 2021-12-04 DIAGNOSIS — I428 Other cardiomyopathies: Secondary | ICD-10-CM

## 2021-12-04 DIAGNOSIS — Z95810 Presence of automatic (implantable) cardiac defibrillator: Secondary | ICD-10-CM | POA: Diagnosis not present

## 2021-12-04 LAB — CUP PACEART INCLINIC DEVICE CHECK
Battery Remaining Longevity: 99 mo
Battery Voltage: 3 V
Brady Statistic AP VP Percent: 13.14 %
Brady Statistic AP VS Percent: 20.6 %
Brady Statistic AS VP Percent: 31.78 %
Brady Statistic AS VS Percent: 34.48 %
Brady Statistic RA Percent Paced: 32.27 %
Brady Statistic RV Percent Paced: 44.36 %
Date Time Interrogation Session: 20231030181708
HighPow Impedance: 82 Ohm
Implantable Lead Connection Status: 753985
Implantable Lead Connection Status: 753985
Implantable Lead Implant Date: 20140129
Implantable Lead Implant Date: 20140129
Implantable Lead Location: 753859
Implantable Lead Location: 753860
Implantable Lead Model: 180
Implantable Lead Model: 5076
Implantable Lead Serial Number: 310373
Implantable Pulse Generator Implant Date: 20210818
Lead Channel Impedance Value: 437 Ohm
Lead Channel Impedance Value: 494 Ohm
Lead Channel Impedance Value: 513 Ohm
Lead Channel Pacing Threshold Amplitude: 0.5 V
Lead Channel Pacing Threshold Amplitude: 0.875 V
Lead Channel Pacing Threshold Pulse Width: 0.4 ms
Lead Channel Pacing Threshold Pulse Width: 0.4 ms
Lead Channel Sensing Intrinsic Amplitude: 1.625 mV
Lead Channel Sensing Intrinsic Amplitude: 3.5 mV
Lead Channel Sensing Intrinsic Amplitude: 7.25 mV
Lead Channel Sensing Intrinsic Amplitude: 9.75 mV
Lead Channel Setting Pacing Amplitude: 2 V
Lead Channel Setting Pacing Amplitude: 2 V
Lead Channel Setting Pacing Pulse Width: 0.4 ms
Lead Channel Setting Sensing Sensitivity: 0.3 mV
Zone Setting Status: 755011
Zone Setting Status: 755011

## 2021-12-04 MED ORDER — EMPAGLIFLOZIN 10 MG PO TABS: 10.0000 mg | ORAL_TABLET | Freq: Every day | ORAL | 3 refills | Status: AC

## 2021-12-04 MED ORDER — SPIRONOLACTONE 25 MG PO TABS: 12.5000 mg | ORAL_TABLET | Freq: Every day | ORAL | 3 refills | Status: DC

## 2021-12-04 NOTE — Patient Instructions (Signed)
Medication Instructions:  Restart JARDIANCE 10 MG DAILY   *If you need a refill on your cardiac medications before your next appointment, please call your pharmacy*   Lab Work: BMET IN 2 WEEKS   If you have labs (blood work) drawn today and your tests are completely normal, you will receive your results only by: [place] (if you have MyChart) OR A paper copy in the mail If you have any lab test that is abnormal or we need to change your treatment, we will call you to review the results.   Testing/Procedures:    Follow-Up: At [facility], you and your health needs are our priority.  As part of our continuing mission to provide you with exceptional heart care, we have created designated Provider Care Teams.  These Care Teams include your primary Cardiologist (physician) and Advanced Practice Providers (APPs -  Physician Assistants and Nurse Practitioners) who all work together to provide you with the care you need, when you need it.  We recommend signing up for the patient portal called "MyChart".  Sign up information is provided on this After Visit Summary.  MyChart is used to connect with patients for Virtual Visits (Telemedicine).  Patients are able to view lab/test results, encounter notes, upcoming appointments, etc.  Non-urgent messages can be sent to your provider as well.   To learn more about what you can do with MyChart, go to [email or web address].    Your next appointment:   4 month(s)  The format for your next appointment:   In Person  Provider:   You will see one of the following Advanced Practice Providers on your designated Care Team:   Tommye Standard, [place]   Other Instructions   Important Information About Sugar

## 2021-12-07 DIAGNOSIS — M2021 Hallux rigidus, right foot: Secondary | ICD-10-CM | POA: Diagnosis not present

## 2021-12-13 DIAGNOSIS — M21611 Bunion of right foot: Secondary | ICD-10-CM | POA: Diagnosis not present

## 2021-12-20 ENCOUNTER — Ambulatory Visit (INDEPENDENT_AMBULATORY_CARE_PROVIDER_SITE_OTHER): Payer: Medicare HMO

## 2021-12-20 DIAGNOSIS — I428 Other cardiomyopathies: Secondary | ICD-10-CM

## 2021-12-20 LAB — CUP PACEART REMOTE DEVICE CHECK
Battery Remaining Longevity: 99 mo
Battery Voltage: 3 V
Brady Statistic AP VP Percent: 1.79 %
Brady Statistic AP VS Percent: 33.75 %
Brady Statistic AS VP Percent: 17.74 %
Brady Statistic AS VS Percent: 46.72 %
Brady Statistic RA Percent Paced: 35.13 %
Brady Statistic RV Percent Paced: 19.5 %
Date Time Interrogation Session: 20231115033325
HighPow Impedance: 83 Ohm
Implantable Lead Connection Status: 753985
Implantable Lead Connection Status: 753985
Implantable Lead Implant Date: 20140129
Implantable Lead Implant Date: 20140129
Implantable Lead Location: 753859
Implantable Lead Location: 753860
Implantable Lead Model: 180
Implantable Lead Model: 5076
Implantable Lead Serial Number: 310373
Implantable Pulse Generator Implant Date: 20210818
Lead Channel Impedance Value: 437 Ohm
Lead Channel Impedance Value: 513 Ohm
Lead Channel Impedance Value: 532 Ohm
Lead Channel Pacing Threshold Amplitude: 0.5 V
Lead Channel Pacing Threshold Amplitude: 0.875 V
Lead Channel Pacing Threshold Pulse Width: 0.4 ms
Lead Channel Pacing Threshold Pulse Width: 0.4 ms
Lead Channel Sensing Intrinsic Amplitude: 2.125 mV
Lead Channel Sensing Intrinsic Amplitude: 2.125 mV
Lead Channel Sensing Intrinsic Amplitude: 8.125 mV
Lead Channel Sensing Intrinsic Amplitude: 8.125 mV
Lead Channel Setting Pacing Amplitude: 2 V
Lead Channel Setting Pacing Amplitude: 2 V
Lead Channel Setting Pacing Pulse Width: 0.4 ms
Lead Channel Setting Sensing Sensitivity: 0.3 mV
Zone Setting Status: 755011
Zone Setting Status: 755011

## 2022-01-01 ENCOUNTER — Ambulatory Visit (INDEPENDENT_AMBULATORY_CARE_PROVIDER_SITE_OTHER): Payer: Medicare HMO

## 2022-01-01 DIAGNOSIS — I428 Other cardiomyopathies: Secondary | ICD-10-CM

## 2022-01-01 DIAGNOSIS — Z95810 Presence of automatic (implantable) cardiac defibrillator: Secondary | ICD-10-CM

## 2022-01-02 ENCOUNTER — Other Ambulatory Visit: Payer: Medicare HMO

## 2022-01-04 DIAGNOSIS — H40023 Open angle with borderline findings, high risk, bilateral: Secondary | ICD-10-CM | POA: Diagnosis not present

## 2022-01-04 DIAGNOSIS — M2011 Hallux valgus (acquired), right foot: Secondary | ICD-10-CM | POA: Diagnosis not present

## 2022-01-04 DIAGNOSIS — H524 Presbyopia: Secondary | ICD-10-CM | POA: Diagnosis not present

## 2022-01-05 NOTE — Progress Notes (Signed)
EPIC Encounter for ICM Monitoring  Patient Name: Michelle Castro is a 71 y.o. female Date: 01/05/2022 Primary Care Physican: Street, [name], MD Primary Cardiologist: Caryl Comes Electrophysiologist: Caryl Comes 04/26/2021 Office Weight: 210 lbs   Time in AT/AF  0.0 hr/day (0.0%)                                                             Transmission reviewed.    Optivol thoracic impedance normal but was suggesting intermittent days with possible fluid accumulation.   Prescribed:  Spironolactone 25 mg take 0.5 tablet (12.5 mg total) by mouth daily.   Labs: 03/17/2021 Creatinine 1.14, BUN 16, Potassium 4.5, Sodium 137, GFR 51   Recommendations:  No changes.   Follow-up plan: ICM clinic phone appointment on 02/12/2022.   91 day device clinic remote transmission 03/21/2022.     EP/Cardiology Office Visits:  Recall 04/04/2022 with Tommye Standard, PA.    Copy of ICM check sent to Dr. Caryl Comes.   3 month ICM trend: 01/01/2022.    12-14 Month ICM trend:     Rosalene Billings, RN 01/05/2022 7:50 AM

## 2022-01-11 NOTE — Progress Notes (Signed)
Remote ICD transmission.   

## 2022-02-12 ENCOUNTER — Ambulatory Visit (INDEPENDENT_AMBULATORY_CARE_PROVIDER_SITE_OTHER): Payer: Medicare HMO

## 2022-02-12 DIAGNOSIS — I428 Other cardiomyopathies: Secondary | ICD-10-CM | POA: Diagnosis not present

## 2022-02-12 DIAGNOSIS — Z95810 Presence of automatic (implantable) cardiac defibrillator: Secondary | ICD-10-CM

## 2022-02-15 ENCOUNTER — Telehealth: Payer: Self-pay

## 2022-02-15 NOTE — Progress Notes (Signed)
EPIC Encounter for ICM Monitoring  Patient Name: Tyriana Helmkamp is a 72 y.o. female Date: 02/15/2022 Primary Care Physican: Street, [name], MD Primary Cardiologist: Caryl Comes Electrophysiologist: Caryl Comes 04/26/2021 Office Weight: 210 lbs   Time in AT/AF  0.0 hr/day (0.0%)                                                             Attempted call to patient and unable to reach.  Left detailed message per DPR regarding transmission. Transmission reviewed.    Optivol thoracic impedance suggesting possible fluid accumulation starting 12/18 and returned close to normal 02/12/2022.   Prescribed:  Spironolactone 25 mg take 0.5 tablet (12.5 mg total) by mouth daily.   Labs: 03/17/2021 Creatinine 1.14, BUN 16, Potassium 4.5, Sodium 137, GFR 51   Recommendations:  Left voice mail with ICM number and encouraged to call if experiencing any fluid symptoms.   Follow-up plan: ICM clinic phone appointment on 03/19/2022.   91 day device clinic remote transmission 03/21/2022.     EP/Cardiology Office Visits:  Recall 04/04/2022 with Tommye Standard, PA.    Copy of ICM check sent to Dr. Caryl Comes.    3 month ICM trend: 02/12/2022.    12-14 Month ICM trend:     Rosalene Billings, RN 02/15/2022 2:35 PM

## 2022-02-15 NOTE — Telephone Encounter (Signed)
Remote ICM transmission received.  Attempted call to patient regarding ICM remote transmission and left detailed message per DPR.  Advised to return call for any fluid symptoms or questions. Next ICM remote transmission scheduled 03/19/2022.

## 2022-02-24 DIAGNOSIS — R519 Headache, unspecified: Secondary | ICD-10-CM | POA: Diagnosis not present

## 2022-02-24 DIAGNOSIS — J069 Acute upper respiratory infection, unspecified: Secondary | ICD-10-CM | POA: Diagnosis not present

## 2022-02-24 DIAGNOSIS — R070 Pain in throat: Secondary | ICD-10-CM | POA: Diagnosis not present

## 2022-03-13 DIAGNOSIS — L814 Other melanin hyperpigmentation: Secondary | ICD-10-CM | POA: Diagnosis not present

## 2022-03-13 DIAGNOSIS — L209 Atopic dermatitis, unspecified: Secondary | ICD-10-CM | POA: Diagnosis not present

## 2022-03-19 ENCOUNTER — Ambulatory Visit: Payer: Medicare HMO | Attending: Internal Medicine

## 2022-03-19 DIAGNOSIS — Z95810 Presence of automatic (implantable) cardiac defibrillator: Secondary | ICD-10-CM

## 2022-03-19 DIAGNOSIS — I428 Other cardiomyopathies: Secondary | ICD-10-CM | POA: Diagnosis not present

## 2022-03-21 ENCOUNTER — Ambulatory Visit: Payer: Medicare HMO

## 2022-03-21 DIAGNOSIS — I428 Other cardiomyopathies: Secondary | ICD-10-CM

## 2022-03-21 DIAGNOSIS — Z0000 Encounter for general adult medical examination without abnormal findings: Secondary | ICD-10-CM | POA: Diagnosis not present

## 2022-03-21 LAB — CUP PACEART REMOTE DEVICE CHECK
Battery Remaining Longevity: 94 mo
Battery Voltage: 2.99 V
Brady Statistic AP VP Percent: 51.68 %
Brady Statistic AP VS Percent: 0.07 %
Brady Statistic AS VP Percent: 47.57 %
Brady Statistic AS VS Percent: 0.67 %
Brady Statistic RA Percent Paced: 50.62 %
Brady Statistic RV Percent Paced: 98.21 %
Date Time Interrogation Session: 20240214033424
HighPow Impedance: 73 Ohm
Implantable Lead Connection Status: 753985
Implantable Lead Connection Status: 753985
Implantable Lead Implant Date: 20140129
Implantable Lead Implant Date: 20140129
Implantable Lead Location: 753859
Implantable Lead Location: 753860
Implantable Lead Model: 180
Implantable Lead Model: 5076
Implantable Lead Serial Number: 310373
Implantable Pulse Generator Implant Date: 20210818
Lead Channel Impedance Value: 399 Ohm
Lead Channel Impedance Value: 570 Ohm
Lead Channel Impedance Value: 589 Ohm
Lead Channel Pacing Threshold Amplitude: 0.5 V
Lead Channel Pacing Threshold Amplitude: 0.875 V
Lead Channel Pacing Threshold Pulse Width: 0.4 ms
Lead Channel Pacing Threshold Pulse Width: 0.4 ms
Lead Channel Sensing Intrinsic Amplitude: 1.875 mV
Lead Channel Sensing Intrinsic Amplitude: 1.875 mV
Lead Channel Sensing Intrinsic Amplitude: 17.125 mV
Lead Channel Sensing Intrinsic Amplitude: 17.125 mV
Lead Channel Setting Pacing Amplitude: 2 V
Lead Channel Setting Pacing Amplitude: 2 V
Lead Channel Setting Pacing Pulse Width: 0.4 ms
Lead Channel Setting Sensing Sensitivity: 0.3 mV
Zone Setting Status: 755011
Zone Setting Status: 755011

## 2022-03-26 DIAGNOSIS — M19011 Primary osteoarthritis, right shoulder: Secondary | ICD-10-CM | POA: Diagnosis not present

## 2022-03-26 DIAGNOSIS — M7581 Other shoulder lesions, right shoulder: Secondary | ICD-10-CM | POA: Diagnosis not present

## 2022-03-26 NOTE — Progress Notes (Signed)
EPIC Encounter for ICM Monitoring  Patient Name: Michelle Castro is a 72 y.o. female Date: 03/26/2022 Primary Care Physican: Street, [name], MD Primary Cardiologist: Caryl Comes Electrophysiologist: Caryl Comes 04/26/2021 Office Weight: 210 lbs   Time in AT/AF  0.0 hr/day (0.0%)                                                             Transmission reviewed.    Optivol thoracic impedance trending close to normal baseline.   Prescribed:  Spironolactone 25 mg take 0.5 tablet (12.5 mg total) by mouth daily.   Labs: 03/17/2021 Creatinine 1.14, BUN 16, Potassium 4.5, Sodium 137, GFR 51   Recommendations:  No changes.   Follow-up plan: ICM clinic phone appointment on 04/23/2022.   91 day device clinic remote transmission 06/20/2022.     EP/Cardiology Office Visits:  Recall 04/04/2022 with Tommye Standard, PA.    Copy of ICM check sent to Dr. Caryl Comes.     3 month ICM trend: 03/21/2022.    12-14 Month ICM trend:     Rosalene Billings, RN 03/26/2022 10:54 AM

## 2022-04-02 DIAGNOSIS — M5416 Radiculopathy, lumbar region: Secondary | ICD-10-CM | POA: Diagnosis not present

## 2022-04-06 DIAGNOSIS — H401132 Primary open-angle glaucoma, bilateral, moderate stage: Secondary | ICD-10-CM | POA: Diagnosis not present

## 2022-04-10 ENCOUNTER — Telehealth: Payer: Self-pay | Admitting: Internal Medicine

## 2022-04-10 NOTE — Telephone Encounter (Signed)
Caller stated they need to know if the patient's pacemaker is compatible with an MRI and notes patient has glaucoma defects.

## 2022-04-11 NOTE — Telephone Encounter (Signed)
Spoke with Anderson Malta informed her that patients device is not MRI compatible

## 2022-04-17 DIAGNOSIS — K432 Incisional hernia without obstruction or gangrene: Secondary | ICD-10-CM | POA: Diagnosis not present

## 2022-04-17 DIAGNOSIS — K3532 Acute appendicitis with perforation and localized peritonitis, without abscess: Secondary | ICD-10-CM | POA: Diagnosis not present

## 2022-04-17 DIAGNOSIS — I252 Old myocardial infarction: Secondary | ICD-10-CM | POA: Diagnosis not present

## 2022-04-17 DIAGNOSIS — K429 Umbilical hernia without obstruction or gangrene: Secondary | ICD-10-CM | POA: Diagnosis not present

## 2022-04-17 DIAGNOSIS — R531 Weakness: Secondary | ICD-10-CM | POA: Diagnosis not present

## 2022-04-17 DIAGNOSIS — Z792 Long term (current) use of antibiotics: Secondary | ICD-10-CM | POA: Diagnosis not present

## 2022-04-17 DIAGNOSIS — R55 Syncope and collapse: Secondary | ICD-10-CM | POA: Diagnosis not present

## 2022-04-17 DIAGNOSIS — R1110 Vomiting, unspecified: Secondary | ICD-10-CM | POA: Diagnosis not present

## 2022-04-17 DIAGNOSIS — Z95810 Presence of automatic (implantable) cardiac defibrillator: Secondary | ICD-10-CM | POA: Diagnosis not present

## 2022-04-17 DIAGNOSIS — R69 Illness, unspecified: Secondary | ICD-10-CM | POA: Diagnosis not present

## 2022-04-17 DIAGNOSIS — I10 Essential (primary) hypertension: Secondary | ICD-10-CM | POA: Diagnosis not present

## 2022-04-17 DIAGNOSIS — Z885 Allergy status to narcotic agent status: Secondary | ICD-10-CM | POA: Diagnosis not present

## 2022-04-17 DIAGNOSIS — R059 Cough, unspecified: Secondary | ICD-10-CM | POA: Diagnosis not present

## 2022-04-17 DIAGNOSIS — Z950 Presence of cardiac pacemaker: Secondary | ICD-10-CM | POA: Diagnosis not present

## 2022-04-17 DIAGNOSIS — R8271 Bacteriuria: Secondary | ICD-10-CM | POA: Diagnosis not present

## 2022-04-17 DIAGNOSIS — Z853 Personal history of malignant neoplasm of breast: Secondary | ICD-10-CM | POA: Diagnosis not present

## 2022-04-17 DIAGNOSIS — Z79899 Other long term (current) drug therapy: Secondary | ICD-10-CM | POA: Diagnosis not present

## 2022-04-17 DIAGNOSIS — I700 Atherosclerosis of aorta: Secondary | ICD-10-CM | POA: Diagnosis not present

## 2022-04-17 DIAGNOSIS — R1031 Right lower quadrant pain: Secondary | ICD-10-CM | POA: Diagnosis not present

## 2022-04-17 DIAGNOSIS — M1990 Unspecified osteoarthritis, unspecified site: Secondary | ICD-10-CM | POA: Diagnosis not present

## 2022-04-17 DIAGNOSIS — R1030 Lower abdominal pain, unspecified: Secondary | ICD-10-CM | POA: Diagnosis not present

## 2022-04-17 DIAGNOSIS — Z8543 Personal history of malignant neoplasm of ovary: Secondary | ICD-10-CM | POA: Diagnosis not present

## 2022-04-19 NOTE — Progress Notes (Signed)
Remote ICD transmission.   

## 2022-04-23 ENCOUNTER — Ambulatory Visit: Payer: Medicare HMO | Attending: Internal Medicine

## 2022-04-23 DIAGNOSIS — I428 Other cardiomyopathies: Secondary | ICD-10-CM

## 2022-04-23 DIAGNOSIS — Z95810 Presence of automatic (implantable) cardiac defibrillator: Secondary | ICD-10-CM | POA: Diagnosis not present

## 2022-04-24 ENCOUNTER — Telehealth: Payer: Self-pay

## 2022-04-24 NOTE — Progress Notes (Signed)
EPIC Encounter for ICM Monitoring  Patient Name: Michelle Castro is a 72 y.o. female Date: 04/24/2022 Primary Care Physican: Street, [name], MD Primary Cardiologist: Caryl Comes Electrophysiologist: Caryl Comes 04/26/2021 Office Weight: 210 lbs   Time in AT/AF  0.0 hr/day (0.0%)                                                             Attempted call to patient and unable to reach.  Left detailed message per DPR regarding transmission. Transmission reviewed.    Optivol thoracic impedance suggesting possible fluid accumulation starting 3/11 and trending back toward baseline.   Prescribed:  Spironolactone 25 mg take 0.5 tablet (12.5 mg total) by mouth daily.   Labs: 03/17/2021 Creatinine 1.14, BUN 16, Potassium 4.5, Sodium 137, GFR 51   Recommendations:  Left voice mail with ICM number and encouraged to call if experiencing any fluid symptoms.   Follow-up plan: ICM clinic phone appointment on 04/30/2022 to recheck fluid levels.   91 day device clinic remote transmission 06/20/2022.     EP/Cardiology Office Visits:  Recall 04/04/2022 with Tommye Standard, PA (4 mo f/u).    Copy of ICM check sent to Dr. Caryl Comes.    3 month ICM trend: 04/23/2022.    12-14 Month ICM trend:     Rosalene Billings, RN 04/24/2022 8:42 AM

## 2022-04-24 NOTE — Telephone Encounter (Signed)
Remote ICM transmission received.  Attempted call to patient regarding ICM remote transmission and left detailed message per DPR.  Advised to return call for any fluid symptoms or questions.  

## 2022-04-27 DIAGNOSIS — K2960 Other gastritis without bleeding: Secondary | ICD-10-CM | POA: Diagnosis not present

## 2022-04-27 DIAGNOSIS — I4729 Other ventricular tachycardia: Secondary | ICD-10-CM | POA: Diagnosis not present

## 2022-04-27 DIAGNOSIS — I4581 Long QT syndrome: Secondary | ICD-10-CM | POA: Diagnosis not present

## 2022-04-27 DIAGNOSIS — T466X5A Adverse effect of antihyperlipidemic and antiarteriosclerotic drugs, initial encounter: Secondary | ICD-10-CM | POA: Diagnosis not present

## 2022-04-27 DIAGNOSIS — R7301 Impaired fasting glucose: Secondary | ICD-10-CM | POA: Diagnosis not present

## 2022-04-27 DIAGNOSIS — Z79899 Other long term (current) drug therapy: Secondary | ICD-10-CM | POA: Diagnosis not present

## 2022-04-27 DIAGNOSIS — Z95810 Presence of automatic (implantable) cardiac defibrillator: Secondary | ICD-10-CM | POA: Diagnosis not present

## 2022-04-27 DIAGNOSIS — Z0000 Encounter for general adult medical examination without abnormal findings: Secondary | ICD-10-CM | POA: Diagnosis not present

## 2022-04-27 DIAGNOSIS — E785 Hyperlipidemia, unspecified: Secondary | ICD-10-CM | POA: Diagnosis not present

## 2022-04-27 DIAGNOSIS — N1831 Chronic kidney disease, stage 3a: Secondary | ICD-10-CM | POA: Diagnosis not present

## 2022-04-27 DIAGNOSIS — G720 Drug-induced myopathy: Secondary | ICD-10-CM | POA: Diagnosis not present

## 2022-04-27 DIAGNOSIS — I5020 Unspecified systolic (congestive) heart failure: Secondary | ICD-10-CM | POA: Diagnosis not present

## 2022-04-30 ENCOUNTER — Ambulatory Visit: Payer: Medicare HMO | Attending: Internal Medicine

## 2022-04-30 DIAGNOSIS — Z95810 Presence of automatic (implantable) cardiac defibrillator: Secondary | ICD-10-CM

## 2022-04-30 DIAGNOSIS — I428 Other cardiomyopathies: Secondary | ICD-10-CM

## 2022-04-30 DIAGNOSIS — Z09 Encounter for follow-up examination after completed treatment for conditions other than malignant neoplasm: Secondary | ICD-10-CM | POA: Diagnosis not present

## 2022-04-30 DIAGNOSIS — K353 Acute appendicitis with localized peritonitis, without perforation or gangrene: Secondary | ICD-10-CM | POA: Diagnosis not present

## 2022-05-01 NOTE — Progress Notes (Signed)
EPIC Encounter for ICM Monitoring  Patient Name: Michelle Castro is a 72 y.o. female Date: 05/01/2022 Primary Care Physican: Street, [name], MD Primary Cardiologist: Caryl Comes Electrophysiologist: Caryl Comes 04/26/2021 Office Weight: 210 lbs   Time in AT/AF  0.0 hr/day (0.0%)                                                             Transmission reviewed.    Optivol thoracic impedance suggesting fluid levels returned to normal.   Prescribed:  Spironolactone 25 mg take 0.5 tablet (12.5 mg total) by mouth daily.   Labs: 03/17/2021 Creatinine 1.14, BUN 16, Potassium 4.5, Sodium 137, GFR 51   Recommendations:  No changes.    Follow-up plan: ICM clinic phone appointment on 05/28/2022.   91 day device clinic remote transmission 06/20/2022.     EP/Cardiology Office Visits:  Recall 04/04/2022 with Tommye Standard, PA (4 mo f/u).    Copy of ICM check sent to Dr. Caryl Comes.    3 month ICM trend: 04/30/2022.    12-14 Month ICM trend:     Rosalene Billings, RN 05/01/2022 3:06 PM

## 2022-05-28 ENCOUNTER — Ambulatory Visit: Payer: Medicare HMO | Attending: Internal Medicine

## 2022-05-28 DIAGNOSIS — I428 Other cardiomyopathies: Secondary | ICD-10-CM | POA: Diagnosis not present

## 2022-05-28 DIAGNOSIS — Z95810 Presence of automatic (implantable) cardiac defibrillator: Secondary | ICD-10-CM | POA: Diagnosis not present

## 2022-05-30 NOTE — Progress Notes (Signed)
EPIC Encounter for ICM Monitoring  Patient Name: Michelle Castro is a 72 y.o. female Date: 05/30/2022 Primary Care Physican: Street, Stephanie Coup, MD Primary Cardiologist: Graciela Husbands Electrophysiologist: Graciela Husbands 04/26/2021 Office Weight: 210 lbs   Time in AT/AF  0.0 hr/day (0.0%)                                                             Transmission reviewed.    Optivol thoracic impedance suggesting normal fluid levels with exception of possible fluid accumulation from 3/11-3/22.   Prescribed:  Spironolactone 25 mg take 0.5 tablet (12.5 mg total) by mouth daily.   Labs: 03/17/2021 Creatinine 1.14, BUN 16, Potassium 4.5, Sodium 137, GFR 51   Recommendations:  No changes.    Follow-up plan: ICM clinic phone appointment on 07/03/2022.   91 day device clinic remote transmission 06/20/2022.     EP/Cardiology Office Visits:  Recall 04/04/2022 with Francis Dowse, PA (4 mo f/u).    Copy of ICM check sent to Dr. Graciela Husbands.     3 month ICM trend: 05/28/2022.    12-14 Month ICM trend:     Karie Soda, RN 05/30/2022 1:07 PM

## 2022-06-20 ENCOUNTER — Ambulatory Visit (INDEPENDENT_AMBULATORY_CARE_PROVIDER_SITE_OTHER): Payer: Medicare HMO

## 2022-06-20 DIAGNOSIS — I428 Other cardiomyopathies: Secondary | ICD-10-CM

## 2022-06-20 LAB — CUP PACEART REMOTE DEVICE CHECK
Battery Remaining Longevity: 88 mo
Battery Voltage: 2.99 V
Brady Statistic AP VP Percent: 32.45 %
Brady Statistic AP VS Percent: 0.06 %
Brady Statistic AS VP Percent: 66.48 %
Brady Statistic AS VS Percent: 1.01 %
Brady Statistic RA Percent Paced: 30.99 %
Brady Statistic RV Percent Paced: 96.78 %
Date Time Interrogation Session: 20240515012403
HighPow Impedance: 70 Ohm
Implantable Lead Connection Status: 753985
Implantable Lead Connection Status: 753985
Implantable Lead Implant Date: 20140129
Implantable Lead Implant Date: 20140129
Implantable Lead Location: 753859
Implantable Lead Location: 753860
Implantable Lead Model: 180
Implantable Lead Model: 5076
Implantable Lead Serial Number: 310373
Implantable Pulse Generator Implant Date: 20210818
Lead Channel Impedance Value: 437 Ohm
Lead Channel Impedance Value: 494 Ohm
Lead Channel Impedance Value: 494 Ohm
Lead Channel Pacing Threshold Amplitude: 0.5 V
Lead Channel Pacing Threshold Amplitude: 1 V
Lead Channel Pacing Threshold Pulse Width: 0.4 ms
Lead Channel Pacing Threshold Pulse Width: 0.4 ms
Lead Channel Sensing Intrinsic Amplitude: 2 mV
Lead Channel Sensing Intrinsic Amplitude: 2 mV
Lead Channel Sensing Intrinsic Amplitude: 9.25 mV
Lead Channel Sensing Intrinsic Amplitude: 9.25 mV
Lead Channel Setting Pacing Amplitude: 2 V
Lead Channel Setting Pacing Amplitude: 2 V
Lead Channel Setting Pacing Pulse Width: 0.4 ms
Lead Channel Setting Sensing Sensitivity: 0.3 mV
Zone Setting Status: 755011
Zone Setting Status: 755011

## 2022-07-03 ENCOUNTER — Ambulatory Visit: Payer: Medicare HMO | Attending: Internal Medicine

## 2022-07-03 DIAGNOSIS — I428 Other cardiomyopathies: Secondary | ICD-10-CM | POA: Diagnosis not present

## 2022-07-03 DIAGNOSIS — Z95810 Presence of automatic (implantable) cardiac defibrillator: Secondary | ICD-10-CM | POA: Diagnosis not present

## 2022-07-04 NOTE — Progress Notes (Signed)
EPIC Encounter for ICM Monitoring  Patient Name: Michelle Castro is a 72 y.o. female Date: 07/04/2022 Primary Care Physican: Street, Stephanie Coup, MD Primary Cardiologist: Graciela Husbands Electrophysiologist: Graciela Husbands 04/26/2021 Office Weight: 210 lbs   Time in AT/AF  0.0 hr/day (0.0%)                                                             Spoke with patient and heart failure questions reviewed.  Transmission results reviewed.  Pt feeling more tired lately.    She has been eating restaurant foods more frequently.   Optivol thoracic impedance suggesting possible fluid accumulation starting 5/8.   Prescribed:  Spironolactone 25 mg take 0.5 tablet (12.5 mg total) by mouth daily.   Labs: 03/17/2021 Creatinine 1.14, BUN 16, Potassium 4.5, Sodium 137, GFR 51   Recommendations:  Advised to limit restaurant foods if possible since the foods can be high in salt and contribute to fluid accumulation.   Copy sent to Dr Graciela Husbands for review.     Follow-up plan: ICM clinic phone appointment on 07/09/2022 to recheck fluid levels.   91 day device clinic remote transmission 09/19/2022.     EP/Cardiology Office Visits:  11/05/2022 with Dr Graciela Husbands.    Copy of ICM check sent to Dr. Graciela Husbands.      3 month ICM trend: 07/03/2022.    12-14 Month ICM trend:     Karie Soda, RN 07/04/2022 9:04 AM

## 2022-07-04 NOTE — Progress Notes (Signed)
Per secure chat  

## 2022-07-05 NOTE — Progress Notes (Signed)
Remote ICD transmission.   

## 2022-07-09 ENCOUNTER — Ambulatory Visit: Payer: Medicare HMO | Attending: Internal Medicine

## 2022-07-09 DIAGNOSIS — I428 Other cardiomyopathies: Secondary | ICD-10-CM

## 2022-07-09 DIAGNOSIS — Z95810 Presence of automatic (implantable) cardiac defibrillator: Secondary | ICD-10-CM

## 2022-07-10 ENCOUNTER — Telehealth: Payer: Self-pay

## 2022-07-10 NOTE — Telephone Encounter (Signed)
Remote ICM transmission received.  Attempted call to patient regarding ICM remote transmission and left detailed regarding transmission.  Left ICM phone number and advised to return call for any fluid symptoms or questions. Next ICM remote transmission scheduled 08/06/2022.

## 2022-07-10 NOTE — Progress Notes (Signed)
EPIC Encounter for ICM Monitoring  Patient Name: Michelle Castro is a 72 y.o. female Date: 07/10/2022 Primary Care Physican: Street, Stephanie Coup, MD Primary Cardiologist: Graciela Husbands Electrophysiologist: Graciela Husbands 04/26/2021 Office Weight: 210 lbs   Time in AT/AF  0.0 hr/day (0.0%)                                                             Attempted call to patient and unable to reach.  Left detailed message per DPR regarding transmission. Transmission reviewed.    Optivol thoracic impedance suggesting fluid levels returned to normal after recommending to limit salt intake.   Prescribed:  Spironolactone 25 mg take 0.5 tablet (12.5 mg total) by mouth daily.   Labs: 03/17/2021 Creatinine 1.14, BUN 16, Potassium 4.5, Sodium 137, GFR 51   Recommendations:  Left voice mail with ICM number and encouraged to call if experiencing any fluid symptoms.   Follow-up plan: ICM clinic phone appointment on 08/06/2022.   91 day device clinic remote transmission 09/19/2022.     EP/Cardiology Office Visits:  11/05/2022 with Dr Graciela Husbands.    Copy of ICM check sent to Dr. Graciela Husbands.  3 month ICM trend: 07/09/2022.    12-14 Month ICM trend:     Karie Soda, RN 07/10/2022 3:12 PM

## 2022-08-06 ENCOUNTER — Ambulatory Visit: Payer: Medicare HMO | Attending: Internal Medicine

## 2022-08-06 DIAGNOSIS — Z95810 Presence of automatic (implantable) cardiac defibrillator: Secondary | ICD-10-CM

## 2022-08-06 DIAGNOSIS — I428 Other cardiomyopathies: Secondary | ICD-10-CM | POA: Diagnosis not present

## 2022-08-07 ENCOUNTER — Other Ambulatory Visit: Payer: Self-pay | Admitting: Internal Medicine

## 2022-08-07 ENCOUNTER — Telehealth: Payer: Self-pay

## 2022-08-07 NOTE — Patient Outreach (Signed)
  Care Coordination   08/07/2022 Name: Michelle Castro MRN: [id number] DOB: 1950/06/01   Care Coordination Outreach Attempts:  An unsuccessful telephone outreach was attempted today to offer the patient information about available care coordination services.  Follow Up Plan:  Additional outreach attempts will be made to offer the patient care coordination information and services.   Encounter Outcome:  No Answer   Care Coordination Interventions:  No, not indicated    Rowe Pavy, RN, BSN, [facility] [employer] [phone]

## 2022-08-08 NOTE — Progress Notes (Signed)
EPIC Encounter for ICM Monitoring  Patient Name: Michelle Castro is a 72 y.o. female Date: 08/08/2022 Primary Care Physican: Street, Stephanie Coup, MD Primary Cardiologist: Graciela Husbands Electrophysiologist: Graciela Husbands 04/26/2021 Office Weight: 210 lbs   Time in AT/AF  0.0 hr/day (0.0%)                                                             Transmission reviewed.    Optivol thoracic impedance suggesting normal fluid levels with the exception of possible fluid accumulation from 6/14-6/20.   Prescribed:  Spironolactone 25 mg take 0.5 tablet (12.5 mg total) by mouth daily.   Labs: 03/17/2021 Creatinine 1.14, BUN 16, Potassium 4.5, Sodium 137, GFR 51   Recommendations:  No changes.   Follow-up plan: ICM clinic phone appointment on 09/10/2022.   91 day device clinic remote transmission 09/19/2022.     EP/Cardiology Office Visits:  11/05/2022 with Dr Graciela Husbands.    Copy of ICM check sent to Dr. Graciela Husbands.   3 month ICM trend: 08/06/2022.    12-14 Month ICM trend:     Karie Soda, RN 08/08/2022 7:46 AM

## 2022-09-10 ENCOUNTER — Ambulatory Visit: Payer: Medicare HMO

## 2022-09-10 DIAGNOSIS — Z95810 Presence of automatic (implantable) cardiac defibrillator: Secondary | ICD-10-CM

## 2022-09-10 DIAGNOSIS — I428 Other cardiomyopathies: Secondary | ICD-10-CM | POA: Diagnosis not present

## 2022-09-13 NOTE — Progress Notes (Signed)
EPIC Encounter for ICM Monitoring  Patient Name: Michelle Castro is a 72 y.o. female Date: 09/13/2022 Primary Care Physican: Street, Stephanie Coup, MD Primary Cardiologist: Graciela Husbands Electrophysiologist: Graciela Husbands 04/26/2021 Office Weight: 210 lbs 09/13/2022 Weight: 209 lbs   Time in AT/AF  0.0 hr/day (0.0%)                                                             Spoke with patient and heart failure questions reviewed.  Transmission results reviewed.  Pt asymptomatic for fluid accumulation.  Reports feeling well at this time and voices no complaints.  No fluid symptoms during decreased impedance.   Optivol thoracic impedance suggesting normal fluid levels with the exception of possible fluid accumulation from 7/5-7/26.   Prescribed:  Spironolactone 25 mg take 0.5 tablet (12.5 mg total) by mouth daily.   Labs: 03/17/2021 Creatinine 1.14, BUN 16, Potassium 4.5, Sodium 137, GFR 51   Recommendations:  Encouraged to limit salt and fluid intake to call if experiencing any fluid symptoms.   Follow-up plan: ICM clinic phone appointment on 10/15/2022.   91 day device clinic remote transmission 12/19/2022.     EP/Cardiology Office Visits:  11/05/2022 with Dr Graciela Husbands.    Copy of ICM check sent to Dr. Graciela Husbands.   3 month ICM trend: 09/10/2022.    12-14 Month ICM trend:     Karie Soda, RN 09/13/2022 8:16 AM

## 2022-09-19 ENCOUNTER — Ambulatory Visit (INDEPENDENT_AMBULATORY_CARE_PROVIDER_SITE_OTHER): Payer: Medicare HMO

## 2022-09-19 DIAGNOSIS — I428 Other cardiomyopathies: Secondary | ICD-10-CM | POA: Diagnosis not present

## 2022-09-19 LAB — CUP PACEART REMOTE DEVICE CHECK
Battery Remaining Longevity: 83 mo
Battery Voltage: 2.98 V
Brady Statistic AP VP Percent: 41.07 %
Brady Statistic AP VS Percent: 0.06 %
Brady Statistic AS VP Percent: 58.28 %
Brady Statistic AS VS Percent: 0.59 %
Brady Statistic RA Percent Paced: 40.15 %
Brady Statistic RV Percent Paced: 98.25 %
Date Time Interrogation Session: 20240814001705
HighPow Impedance: 87 Ohm
Implantable Lead Connection Status: 753985
Implantable Lead Connection Status: 753985
Implantable Lead Implant Date: 20140129
Implantable Lead Implant Date: 20140129
Implantable Lead Location: 753859
Implantable Lead Location: 753860
Implantable Lead Model: 180
Implantable Lead Model: 5076
Implantable Lead Serial Number: 310373
Implantable Pulse Generator Implant Date: 20210818
Lead Channel Impedance Value: 399 Ohm
Lead Channel Impedance Value: 456 Ohm
Lead Channel Impedance Value: 494 Ohm
Lead Channel Pacing Threshold Amplitude: 0.5 V
Lead Channel Pacing Threshold Amplitude: 1 V
Lead Channel Pacing Threshold Pulse Width: 0.4 ms
Lead Channel Pacing Threshold Pulse Width: 0.4 ms
Lead Channel Sensing Intrinsic Amplitude: 1.75 mV
Lead Channel Sensing Intrinsic Amplitude: 1.75 mV
Lead Channel Sensing Intrinsic Amplitude: 19.5 mV
Lead Channel Sensing Intrinsic Amplitude: 19.5 mV
Lead Channel Setting Pacing Amplitude: 2 V
Lead Channel Setting Pacing Amplitude: 2 V
Lead Channel Setting Pacing Pulse Width: 0.4 ms
Lead Channel Setting Sensing Sensitivity: 0.3 mV
Zone Setting Status: 755011
Zone Setting Status: 755011

## 2022-10-03 NOTE — Progress Notes (Signed)
Remote ICD transmission.   

## 2022-10-08 DIAGNOSIS — R070 Pain in throat: Secondary | ICD-10-CM | POA: Diagnosis not present

## 2022-10-08 DIAGNOSIS — R110 Nausea: Secondary | ICD-10-CM | POA: Diagnosis not present

## 2022-10-08 DIAGNOSIS — R509 Fever, unspecified: Secondary | ICD-10-CM | POA: Diagnosis not present

## 2022-10-08 DIAGNOSIS — R519 Headache, unspecified: Secondary | ICD-10-CM | POA: Diagnosis not present

## 2022-10-15 ENCOUNTER — Ambulatory Visit: Payer: Medicare HMO | Attending: Internal Medicine

## 2022-10-15 DIAGNOSIS — I428 Other cardiomyopathies: Secondary | ICD-10-CM | POA: Diagnosis not present

## 2022-10-15 DIAGNOSIS — Z95810 Presence of automatic (implantable) cardiac defibrillator: Secondary | ICD-10-CM | POA: Diagnosis not present

## 2022-10-17 ENCOUNTER — Telehealth: Payer: Self-pay

## 2022-10-17 DIAGNOSIS — Z1339 Encounter for screening examination for other mental health and behavioral disorders: Secondary | ICD-10-CM | POA: Diagnosis not present

## 2022-10-17 DIAGNOSIS — J329 Chronic sinusitis, unspecified: Secondary | ICD-10-CM | POA: Diagnosis not present

## 2022-10-17 DIAGNOSIS — Z1331 Encounter for screening for depression: Secondary | ICD-10-CM | POA: Diagnosis not present

## 2022-10-17 DIAGNOSIS — Z6833 Body mass index (BMI) 33.0-33.9, adult: Secondary | ICD-10-CM | POA: Diagnosis not present

## 2022-10-17 NOTE — Telephone Encounter (Signed)
Remote ICM transmission received.  Attempted call to patient regarding ICM remote transmission and left detailed message per DPR.  Left ICM phone number and advised to return call for any fluid symptoms or questions. Next ICM remote transmission scheduled 11/19/2022.

## 2022-10-17 NOTE — Progress Notes (Signed)
EPIC Encounter for ICM Monitoring  Patient Name: Michelle Castro is a 72 y.o. female Date: 10/17/2022 Primary Care Physican: Street, Stephanie Coup, MD Primary Cardiologist: Graciela Husbands Electrophysiologist: Graciela Husbands 04/26/2021 Office Weight: 210 lbs 09/13/2022 Weight: 209 lbs   Time in AT/AF  0.0 hr/day (0.0%)                                                             Attempted call to patient and unable to reach.  Left detailed message per DPR regarding transmission. Transmission reviewed.    Optivol thoracic impedance suggesting normal fluid levels with the exception of possible fluid accumulation from 8/12-8/19.   Prescribed:  Spironolactone 25 mg take 0.5 tablet (12.5 mg total) by mouth daily.   Labs: 03/17/2021 Creatinine 1.14, BUN 16, Potassium 4.5, Sodium 137, GFR 51   Recommendations:  Left voice mail with ICM number and encouraged to call if experiencing any fluid symptoms.   Follow-up plan: ICM clinic phone appointment on 11/19/2022.   91 day device clinic remote transmission 12/19/2022.     EP/Cardiology Office Visits:  11/05/2022 with Dr Graciela Husbands.    Copy of ICM check sent to Dr. Graciela Husbands.   3 month ICM trend: 10/15/2022.    12-14 Month ICM trend:     Karie Soda, RN 10/17/2022 2:17 PM

## 2022-10-24 ENCOUNTER — Other Ambulatory Visit: Payer: Self-pay | Admitting: Physician Assistant

## 2022-10-25 ENCOUNTER — Telehealth: Payer: Self-pay

## 2022-10-25 NOTE — Telephone Encounter (Signed)
Attempted returned call to patient as requested by voice mail message due to patient reporting SOB but unsure if it is fluid related.  She is recovering from Covid and sinus congestion.  Left detailed message on sending manual remote transmission for review and call back number.

## 2022-10-25 NOTE — Telephone Encounter (Signed)
Attempted call to patient and left message with number for call back.

## 2022-11-05 ENCOUNTER — Encounter: Payer: Self-pay | Admitting: Internal Medicine

## 2022-11-05 ENCOUNTER — Ambulatory Visit: Payer: Medicare HMO | Attending: Internal Medicine | Admitting: Internal Medicine

## 2022-11-05 VITALS — BP 118/64 | HR 60 | Ht 67.0 in | Wt 212.0 lb

## 2022-11-05 DIAGNOSIS — I4729 Other ventricular tachycardia: Secondary | ICD-10-CM

## 2022-11-05 DIAGNOSIS — I428 Other cardiomyopathies: Secondary | ICD-10-CM | POA: Diagnosis not present

## 2022-11-05 DIAGNOSIS — Z95810 Presence of automatic (implantable) cardiac defibrillator: Secondary | ICD-10-CM | POA: Diagnosis not present

## 2022-11-05 DIAGNOSIS — I4581 Long QT syndrome: Secondary | ICD-10-CM | POA: Diagnosis not present

## 2022-11-05 NOTE — Progress Notes (Signed)
Patient Care Team: Street, Stephanie Coup, MD as PCP - General   HPI  Michelle Castro is a 72 y.o. female seen in follow-up for an ICD implantation with generator replacement 8/21 with a history of polymorphic ventricular tachycardia QT prolongation Also history of atrial fibrillation with a modest nonischemic cardiomyopathy  Gene testing was apparently started but it was done through her sister;  result were negative  Hx of ICD shock associated of polymorphic ventricular tachycardia/fibrillation which occurred while at rest. She had run out of her propranolol prior to this..  She also was, upon interrogation, noted to have an episode of self terminating polymorphic VT 2/16.   2/23 developed monomorphic ventricular tachycardia and her device was reprogrammed to minimize ventricular pacing  74>.33%   The patient denies  peripheral edema.  There have been no palpitations, lightheadedness or syncope.  Complains of shortness of breath with modest activity.  Also just a great deal of lassitude.  She reminds me of the death of her daughter.  Her grandchildren are in [place].  She also lost her brother.  Has struggled with depression and again.  Also complaining of epigastric discomfort.  This had prompted a CTA 8/23 that was unrevealing  DATE TEST EF   7/17 Echo   40-45 %   11/18 CT  CaScore 0  2/23 Echo  45-50%   8/23 CTA  Ca Score 0       Date Cr K Hgb  2/20 0.98 4.9    11/20 1.1 4.7 13.1(8/21)   2/23 1.14 4.5    8/23 1.2 4.5 13.3        Her daughter died of breast cancer 01/16/2018.  There are 2 children, Samuel Bouche and [name].  Husband's name is Asher Muir.   All are still struggling.  The children visited the summer 2022    Past Medical History:  Diagnosis Date   Anomalous coronary artery origin    RCA come off the left coronary cusp   Arthritis    Breast cancer (HCC) 1986   s/p :L mastectomy   Dementia (HCC)    Depression/ anxiety    Hypertension    Long Q-T syndrome     gene testing pending   PAF (paroxysmal atrial fibrillation) (HCC)    not well documented   PFO (patent foramen ovale)    Syncope    Ventricular tachycardia, polymorphic (HCC)    long-short    Past Surgical History:  Procedure Laterality Date   ABDOMINAL HYSTERECTOMY     AUGMENTATION MAMMAPLASTY     BREAST BIOPSY Right    BREAST SURGERY     mastectomy   ICD GENERATOR CHANGEOUT N/A 09/23/2019   Procedure: ICD GENERATOR CHANGEOUT;  Surgeon: Duke Salvia, MD;  Location: [facility] INVASIVE CV LAB;  Service: Cardiovascular;  Laterality: N/A;   IMPLANTABLE CARDIOVERTER DEFIBRILLATOR IMPLANT N/A 03/05/2012   MDT dual chamber ICD implanted for secondary prevention for Long QT syndrome   MASTECTOMY Left    TOTAL KNEE ARTHROPLASTY     bilateral    Current Outpatient Medications  Medication Sig Dispense Refill   b complex vitamins capsule Take 1 capsule by mouth daily.     dicyclomine (BENTYL) 20 MG tablet Take 20 mg by mouth 4 (four) times daily as needed (stomach pain).      empagliflozin (JARDIANCE) 10 MG TABS tablet Take 1 tablet (10 mg total) by mouth daily before breakfast. 90 tablet 3   latanoprost (XALATAN) 0.005 % ophthalmic  solution Place 1 drop into both eyes at bedtime.     LORazepam (ATIVAN) 1 MG tablet Take 1 mg by mouth as directed.     meloxicam (MOBIC) 15 MG tablet Take 1 tablet by mouth daily.     Multiple Vitamin (MULTIVITAMIN WITH MINERALS) TABS Take 1 tablet by mouth daily.     propranolol ER (INDERAL LA) 120 MG 24 hr capsule TAKE TWO CAPSULES IN THE MORNING AND ONE CAPSULE BY MOUTH EVERY EVENING 270 capsule 1   spironolactone (ALDACTONE) 25 MG tablet TAKE 1/2 TABLET BY MOUTH EVERY DAY 45 tablet 0   tolterodine (DETROL LA) 4 MG 24 hr capsule Take 4 mg by mouth daily.     valACYclovir (VALTREX) 500 MG tablet Take 500 mg by mouth daily.     venlafaxine XR (EFFEXOR-XR) 37.5 MG 24 hr capsule Take 37.5 mg by mouth daily with breakfast.     diazepam (VALIUM) 5 MG tablet TAKE  1/2 TO 1 TAB BY MOUTH DAILY AT BEDTIME AS NEEDED FOR SLEEP. MAY TAKING IN DAYTIME AS NEEDED (Patient not taking: Reported on 11/05/2022)     No current facility-administered medications for this visit.    Allergies  Allergen Reactions   Codeine Nausea Only    Review of Systems negative except from HPI and PMH  Physical Exam BP 118/64 (BP Location: Right Arm, Patient Position: Sitting, Cuff Size: Large)   Pulse 60   Ht 5\' 7"  (1.702 m)   Wt 212 lb (96.2 kg)   SpO2 95%   BMI 33.20 kg/m  Well developed and well nourished in no acute distress HENT normal Neck supple with JVP-flat Clear Device pocket well healed; without hematoma or erythema.  There is no tethering  Regular rate and rhythm, no  gallop No murmur Abd-soft with active BS No Clubbing cyanosis  edema Skin-warm and dry A & Oriented  Grossly normal sensory and motor function  ECG AV pacing  Device function is normal. Programming changes rate response was activated, MVP was inactivated and the AV delay was shortened from 320--200 paced/180 sensed See Paceart for details     Assessment and  Plan  Long QT syndrome-probable  Ventricular tachycardia-nonsustained-polymorphic  Ventricular tachycardia-monomorphic-sustained  ICD implantation-Medtronic device    Nonischemic cardiomyopathy  Complete heart block-new Psychosocial Stress  Right bundle branch block left axis deviation  No interval ventricular tachycardia.  Continue her on Inderal.  Heart block is new.  We will plan to reassess LV function at her next visit.  For now we will continue on her SGLT2 and spironolactone.

## 2022-11-05 NOTE — Patient Instructions (Signed)
Medication Instructions:  Your physician recommends that you continue on your current medications as directed. Please refer to the Current Medication list given to you today.  *If you need a refill on your cardiac medications before your next appointment, please call your pharmacy*   Lab Work: None ordered.  If you have labs (blood work) drawn today and your tests are completely normal, you will receive your results only by: MyChart Message (if you have MyChart) OR A paper copy in the mail If you have any lab test that is abnormal or we need to change your treatment, we will call you to review the results.   Testing/Procedures: None ordered.    Follow-Up: At  [facility], you and your health needs are our priority.  As part of our continuing mission to provide you with exceptional heart care, we have created designated Provider Care Teams.  These Care Teams include your primary Cardiologist (physician) and Advanced Practice Providers (APPs -  Physician Assistants and Nurse Practitioners) who all work together to provide you with the care you need, when you need it.  We recommend signing up for the patient portal called "MyChart".  Sign up information is provided on this After Visit Summary.  MyChart is used to connect with patients for Virtual Visits (Telemedicine).  Patients are able to view lab/test results, encounter notes, upcoming appointments, etc.  Non-urgent messages can be sent to your provider as well.   To learn more about what you can do with MyChart, go to [email or web address].    Your next appointment:   9 months with Dr Klein 

## 2022-11-07 LAB — CUP PACEART INCLINIC DEVICE CHECK
Date Time Interrogation Session: 20240930070404
Implantable Lead Connection Status: 753985
Implantable Lead Connection Status: 753985
Implantable Lead Implant Date: 20140129
Implantable Lead Implant Date: 20140129
Implantable Lead Location: 753859
Implantable Lead Location: 753860
Implantable Lead Model: 180
Implantable Lead Model: 5076
Implantable Lead Serial Number: 310373
Implantable Pulse Generator Implant Date: 20210818
Lead Channel Pacing Threshold Amplitude: 0.5 V
Lead Channel Pacing Threshold Amplitude: 0.875 V
Lead Channel Pacing Threshold Amplitude: 1 V
Lead Channel Pacing Threshold Pulse Width: 0.4 ms
Lead Channel Pacing Threshold Pulse Width: 0.4 ms
Lead Channel Pacing Threshold Pulse Width: 0.4 ms
Lead Channel Setting Pacing Amplitude: 2 V
Lead Channel Setting Pacing Amplitude: 2.5 V
Lead Channel Setting Pacing Pulse Width: 0.4 ms
Lead Channel Setting Sensing Sensitivity: 0.3 mV
Zone Setting Status: 755011
Zone Setting Status: 755011

## 2022-11-19 ENCOUNTER — Ambulatory Visit: Payer: Medicare HMO | Attending: Internal Medicine

## 2022-11-19 DIAGNOSIS — I428 Other cardiomyopathies: Secondary | ICD-10-CM

## 2022-11-19 DIAGNOSIS — Z95810 Presence of automatic (implantable) cardiac defibrillator: Secondary | ICD-10-CM

## 2022-11-21 DIAGNOSIS — M5416 Radiculopathy, lumbar region: Secondary | ICD-10-CM | POA: Diagnosis not present

## 2022-11-23 ENCOUNTER — Telehealth: Payer: Self-pay

## 2022-11-23 NOTE — Progress Notes (Signed)
EPIC Encounter for ICM Monitoring  Patient Name: Michelle Castro is a 72 y.o. female Date: 11/23/2022 Primary Care Physican: Street, Stephanie Coup, MD Primary Cardiologist: Graciela Husbands Electrophysiologist: Graciela Husbands 04/26/2021 Office Weight: 210 lbs 09/13/2022 Weight: 209 lbs   Time in AT/AF  0.0 hr/day (0.0%)                                                             Attempted call to patient and unable to reach.  Left detailed message per DPR regarding transmission. Transmission reviewed.    Optivol thoracic impedance suggesting normal fluid levels with the exception of possible fluid accumulation from 9/23-10/2.   Prescribed:  Spironolactone 25 mg take 0.5 tablet (12.5 mg total) by mouth daily.   Labs: 03/17/2021 Creatinine 1.14, BUN 16, Potassium 4.5, Sodium 137, GFR 51   Recommendations:  Left voice mail with ICM number and encouraged to call if experiencing any fluid symptoms.   Follow-up plan: ICM clinic phone appointment on 12/24/2022.   91 day device clinic remote transmission 12/19/2022.     EP/Cardiology Office Visits:  Recall 08/02/2023 with Dr Graciela Husbands.    Copy of ICM check sent to Dr. Graciela Husbands.  3 month ICM trend: 11/19/2022.    12-14 Month ICM trend:     Karie Soda, RN 11/23/2022 8:57 AM

## 2022-11-23 NOTE — Telephone Encounter (Signed)
Remote ICM transmission received.  Attempted call to patient regarding ICM remote transmission and left detailed message per DPR.  Left ICM phone number and advised to return call for any fluid symptoms or questions. Next ICM remote transmission scheduled 12/24/2022.

## 2022-12-12 DIAGNOSIS — F331 Major depressive disorder, recurrent, moderate: Secondary | ICD-10-CM | POA: Diagnosis not present

## 2022-12-19 ENCOUNTER — Ambulatory Visit (INDEPENDENT_AMBULATORY_CARE_PROVIDER_SITE_OTHER): Payer: Medicare HMO

## 2022-12-19 DIAGNOSIS — I428 Other cardiomyopathies: Secondary | ICD-10-CM | POA: Diagnosis not present

## 2022-12-20 DIAGNOSIS — F331 Major depressive disorder, recurrent, moderate: Secondary | ICD-10-CM | POA: Diagnosis not present

## 2022-12-20 LAB — CUP PACEART REMOTE DEVICE CHECK
Battery Remaining Longevity: 76 mo
Battery Voltage: 2.98 V
Brady Statistic AP VP Percent: 41.78 %
Brady Statistic AP VS Percent: 0.06 %
Brady Statistic AS VP Percent: 57.98 %
Brady Statistic AS VS Percent: 0.17 %
Brady Statistic RA Percent Paced: 41.45 %
Brady Statistic RV Percent Paced: 99.04 %
Date Time Interrogation Session: 20241113022825
HighPow Impedance: 73 Ohm
Implantable Lead Connection Status: 753985
Implantable Lead Connection Status: 753985
Implantable Lead Implant Date: 20140129
Implantable Lead Implant Date: 20140129
Implantable Lead Location: 753859
Implantable Lead Location: 753860
Implantable Lead Model: 180
Implantable Lead Model: 5076
Implantable Lead Serial Number: 310373
Implantable Pulse Generator Implant Date: 20210818
Lead Channel Impedance Value: 399 Ohm
Lead Channel Impedance Value: 494 Ohm
Lead Channel Impedance Value: 494 Ohm
Lead Channel Pacing Threshold Amplitude: 0.5 V
Lead Channel Pacing Threshold Amplitude: 1 V
Lead Channel Pacing Threshold Pulse Width: 0.4 ms
Lead Channel Pacing Threshold Pulse Width: 0.4 ms
Lead Channel Sensing Intrinsic Amplitude: 1.5 mV
Lead Channel Sensing Intrinsic Amplitude: 1.5 mV
Lead Channel Sensing Intrinsic Amplitude: 6.5 mV
Lead Channel Sensing Intrinsic Amplitude: 6.5 mV
Lead Channel Setting Pacing Amplitude: 2 V
Lead Channel Setting Pacing Amplitude: 2.5 V
Lead Channel Setting Pacing Pulse Width: 0.4 ms
Lead Channel Setting Sensing Sensitivity: 0.3 mV
Zone Setting Status: 755011
Zone Setting Status: 755011

## 2022-12-24 ENCOUNTER — Ambulatory Visit: Payer: Medicare HMO | Attending: Internal Medicine

## 2022-12-24 DIAGNOSIS — Z95810 Presence of automatic (implantable) cardiac defibrillator: Secondary | ICD-10-CM | POA: Diagnosis not present

## 2022-12-24 DIAGNOSIS — I428 Other cardiomyopathies: Secondary | ICD-10-CM | POA: Diagnosis not present

## 2022-12-26 ENCOUNTER — Other Ambulatory Visit: Payer: Self-pay | Admitting: Physician Assistant

## 2022-12-26 NOTE — Progress Notes (Signed)
EPIC Encounter for ICM Monitoring  Patient Name: Michelle Castro is a 72 y.o. female Date: 12/26/2022 Primary Care Physican: Street, Stephanie Coup, MD Primary Cardiologist: Graciela Husbands Electrophysiologist: Graciela Husbands 04/26/2021 Office Weight: 210 lbs 09/13/2022 Weight: 209 lbs 12/26/2022 Weight: 201 lbs   Time in AT/AF  0.0 hr/day (0.0%)                                                             Spoke with patient and heart failure questions reviewed.  Transmission results reviewed.  Pt asymptomatic for fluid accumulation.  Reports feeling well at this time and voices no complaints.     Diet:  11/20 reporting she may drink about a gallon of fluid a day and does eat salty foods some  Optivol thoracic impedance suggesting possible fluid accumulation starting 10/31 and returned to baseline 11/18.   Prescribed:  Spironolactone 25 mg take 0.5 tablet (12.5 mg total) by mouth daily.   Labs: 03/17/2021 Creatinine 1.14, BUN 16, Potassium 4.5, Sodium 137, GFR 51   Recommendations:  Recommendation to limit salt intake to 2000 mg daily and fluid intake to 64 oz daily.  Encouraged to call if experiencing any fluid symptoms.    Follow-up plan: ICM clinic phone appointment on 01/28/2023.   91 day device clinic remote transmission 03/20/2023.     EP/Cardiology Office Visits:  Recall 08/02/2023 with Dr Graciela Husbands.    Copy of ICM check sent to Dr. Graciela Husbands.  3 month ICM trend: 12/24/2022.    12-14 Month ICM trend:     Karie Soda, RN 12/26/2022 6:53 AM

## 2023-01-02 DIAGNOSIS — F331 Major depressive disorder, recurrent, moderate: Secondary | ICD-10-CM | POA: Diagnosis not present

## 2023-01-07 DIAGNOSIS — F331 Major depressive disorder, recurrent, moderate: Secondary | ICD-10-CM | POA: Diagnosis not present

## 2023-01-08 DIAGNOSIS — H401132 Primary open-angle glaucoma, bilateral, moderate stage: Secondary | ICD-10-CM | POA: Diagnosis not present

## 2023-01-08 NOTE — Progress Notes (Signed)
Remote ICD transmission.   

## 2023-01-28 ENCOUNTER — Ambulatory Visit: Payer: Medicare HMO | Attending: Internal Medicine

## 2023-01-28 DIAGNOSIS — M47816 Spondylosis without myelopathy or radiculopathy, lumbar region: Secondary | ICD-10-CM | POA: Diagnosis not present

## 2023-01-28 DIAGNOSIS — M5126 Other intervertebral disc displacement, lumbar region: Secondary | ICD-10-CM | POA: Diagnosis not present

## 2023-01-28 DIAGNOSIS — I428 Other cardiomyopathies: Secondary | ICD-10-CM | POA: Diagnosis not present

## 2023-01-28 DIAGNOSIS — M5416 Radiculopathy, lumbar region: Secondary | ICD-10-CM | POA: Diagnosis not present

## 2023-01-28 DIAGNOSIS — M5135 Other intervertebral disc degeneration, thoracolumbar region: Secondary | ICD-10-CM | POA: Diagnosis not present

## 2023-01-28 DIAGNOSIS — Z95810 Presence of automatic (implantable) cardiac defibrillator: Secondary | ICD-10-CM | POA: Diagnosis not present

## 2023-01-28 DIAGNOSIS — M48061 Spinal stenosis, lumbar region without neurogenic claudication: Secondary | ICD-10-CM | POA: Diagnosis not present

## 2023-01-31 NOTE — Progress Notes (Signed)
EPIC Encounter for ICM Monitoring  Patient Name: Michelle Castro is a 72 y.o. female Date: 01/31/2023 Primary Care Physican: Street, Stephanie Coup, MD Primary Cardiologist: Graciela Husbands Electrophysiologist: Graciela Husbands 04/26/2021 Office Weight: 210 lbs 09/13/2022 Weight: 209 lbs 12/26/2022 Weight: 201 lbs 01/31/2023 Weight: 202 lbs   Time in AT/AF  0.0 hr/day (0.0%)                                                             Spoke with patient and heart failure questions reviewed.  Transmission results reviewed.  Pt asymptomatic for fluid accumulation.  Reports feeling well at this time and voices no complaints.     Diet:  11/20 reporting she may drink about a gallon of fluid a day and does eat salty foods some   Optivol thoracic impedance suggesting intermittent days with possible fluid accumulation within the last month.   Prescribed:  Spironolactone 25 mg take 0.5 tablet (12.5 mg total) by mouth daily.   Labs: 03/17/2021 Creatinine 1.14, BUN 16, Potassium 4.5, Sodium 137, GFR 51   Recommendations:  No changes and encouraged to call if experiencing any fluid symptoms.   Follow-up plan: ICM clinic phone appointment on 03/04/2023.   91 day device clinic remote transmission 03/20/2023.     EP/Cardiology Office Visits:  Recall 08/02/2023 with Dr Graciela Husbands.    Copy of ICM check sent to Dr. Graciela Husbands.  3 month ICM trend: 01/28/2023.    12-14 Month ICM trend:     Karie Soda, RN 01/31/2023 9:03 AM

## 2023-02-14 DIAGNOSIS — M5416 Radiculopathy, lumbar region: Secondary | ICD-10-CM | POA: Diagnosis not present

## 2023-02-15 DIAGNOSIS — M19011 Primary osteoarthritis, right shoulder: Secondary | ICD-10-CM | POA: Diagnosis not present

## 2023-02-19 ENCOUNTER — Other Ambulatory Visit: Payer: Self-pay | Admitting: Physician Assistant

## 2023-02-19 DIAGNOSIS — M5416 Radiculopathy, lumbar region: Secondary | ICD-10-CM

## 2023-02-22 NOTE — Discharge Instructions (Signed)

## 2023-02-26 ENCOUNTER — Inpatient Hospital Stay
Admission: RE | Admit: 2023-02-26 | Discharge: 2023-02-26 | Disposition: A | Discharge status: Home / Self Care | Payer: Medicare HMO | Source: Ambulatory Visit | Attending: Physician Assistant | Admitting: Physician Assistant

## 2023-03-04 ENCOUNTER — Ambulatory Visit: Payer: Medicare HMO | Attending: Internal Medicine

## 2023-03-04 DIAGNOSIS — I428 Other cardiomyopathies: Secondary | ICD-10-CM

## 2023-03-04 DIAGNOSIS — Z95810 Presence of automatic (implantable) cardiac defibrillator: Secondary | ICD-10-CM

## 2023-03-04 NOTE — Discharge Instructions (Signed)

## 2023-03-05 ENCOUNTER — Ambulatory Visit
Admission: RE | Admit: 2023-03-05 | Discharge: 2023-03-05 | Disposition: A | Discharge status: Home / Self Care | Payer: Medicare HMO | Source: Ambulatory Visit | Attending: Physician Assistant | Admitting: Physician Assistant

## 2023-03-05 DIAGNOSIS — M5416 Radiculopathy, lumbar region: Secondary | ICD-10-CM

## 2023-03-05 DIAGNOSIS — M48061 Spinal stenosis, lumbar region without neurogenic claudication: Secondary | ICD-10-CM | POA: Diagnosis not present

## 2023-03-05 DIAGNOSIS — M4727 Other spondylosis with radiculopathy, lumbosacral region: Secondary | ICD-10-CM | POA: Diagnosis not present

## 2023-03-05 MED ORDER — IOPAMIDOL (ISOVUE-M 200) INJECTION 41%
1.0000 mL | Freq: Once | INTRAMUSCULAR | Status: AC
  Administered 2023-03-05: 1 mL via EPIDURAL

## 2023-03-05 MED ORDER — METHYLPREDNISOLONE ACETATE 40 MG/ML INJ SUSP (RADIOLOG
80.0000 mg | Freq: Once | INTRAMUSCULAR | Status: AC
  Administered 2023-03-05: 80 mg via EPIDURAL

## 2023-03-08 NOTE — Progress Notes (Signed)
EPIC Encounter for ICM Monitoring  Patient Name: Michelle Castro is a 73 y.o. female Date: 03/08/2023 Primary Care Physican: Street, Stephanie Coup, MD Primary Cardiologist: Graciela Husbands Electrophysiologist: Graciela Husbands 04/26/2021 Office Weight: 210 lbs 09/13/2022 Weight: 209 lbs 12/26/2022 Weight: 201 lbs 01/31/2023 Weight: 202 lbs   Time in AT/AF  0.0 hr/day (0.0%)                                                             Spoke with patient and heart failure questions reviewed.  Transmission results reviewed.  Pt asymptomatic for fluid accumulation.  Reports feeling well at this time and voices no complaints.     Diet:  12/26/22 reporting she may drink about a gallon of fluid a day and does eat salty foods some   Optivol thoracic impedance suggesting normal fluid levels since 1/14.  Was possible fluid accumulation from 12/20-1/13.   Prescribed:  Spironolactone 25 mg take 0.5 tablet (12.5 mg total) by mouth daily.   Labs: 03/17/2021 Creatinine 1.14, BUN 16, Potassium 4.5, Sodium 137, GFR 51   Recommendations:  No changes and encouraged to call if experiencing any fluid symptoms.   Follow-up plan: ICM clinic phone appointment on 04/08/2023.   91 day device clinic remote transmission 03/20/2023.     EP/Cardiology Office Visits:  Recall 08/02/2023 with Dr Graciela Husbands.    Copy of ICM check sent to Dr. Graciela Husbands.   3 month ICM trend: 03/04/2023.    12-14 Month ICM trend:     Karie Soda, RN 03/08/2023 9:24 AM

## 2023-03-20 ENCOUNTER — Ambulatory Visit (INDEPENDENT_AMBULATORY_CARE_PROVIDER_SITE_OTHER): Payer: Medicare Other

## 2023-03-20 DIAGNOSIS — I428 Other cardiomyopathies: Secondary | ICD-10-CM | POA: Diagnosis not present

## 2023-03-22 DIAGNOSIS — H401121 Primary open-angle glaucoma, left eye, mild stage: Secondary | ICD-10-CM | POA: Diagnosis not present

## 2023-03-22 DIAGNOSIS — H401112 Primary open-angle glaucoma, right eye, moderate stage: Secondary | ICD-10-CM | POA: Diagnosis not present

## 2023-03-22 DIAGNOSIS — H25812 Combined forms of age-related cataract, left eye: Secondary | ICD-10-CM | POA: Diagnosis not present

## 2023-03-22 DIAGNOSIS — H25811 Combined forms of age-related cataract, right eye: Secondary | ICD-10-CM | POA: Diagnosis not present

## 2023-03-22 LAB — CUP PACEART REMOTE DEVICE CHECK
Battery Remaining Longevity: 69 mo
Battery Voltage: 2.97 V
Brady Statistic AP VP Percent: 46.81 %
Brady Statistic AP VS Percent: 0.25 %
Brady Statistic AS VP Percent: 52.48 %
Brady Statistic AS VS Percent: 0.46 %
Brady Statistic RA Percent Paced: 45.71 %
Brady Statistic RV Percent Paced: 97.16 %
Date Time Interrogation Session: 20250213172705
HighPow Impedance: 78 Ohm
Implantable Lead Connection Status: 753985
Implantable Lead Connection Status: 753985
Implantable Lead Implant Date: 20140129
Implantable Lead Implant Date: 20140129
Implantable Lead Location: 753859
Implantable Lead Location: 753860
Implantable Lead Model: 180
Implantable Lead Model: 5076
Implantable Lead Serial Number: 310373
Implantable Pulse Generator Implant Date: 20210818
Lead Channel Impedance Value: 437 Ohm
Lead Channel Impedance Value: 456 Ohm
Lead Channel Impedance Value: 513 Ohm
Lead Channel Pacing Threshold Amplitude: 0.5 V
Lead Channel Pacing Threshold Amplitude: 0.75 V
Lead Channel Pacing Threshold Pulse Width: 0.4 ms
Lead Channel Pacing Threshold Pulse Width: 0.4 ms
Lead Channel Sensing Intrinsic Amplitude: 1.5 mV
Lead Channel Sensing Intrinsic Amplitude: 1.5 mV
Lead Channel Sensing Intrinsic Amplitude: 14.625 mV
Lead Channel Sensing Intrinsic Amplitude: 14.625 mV
Lead Channel Setting Pacing Amplitude: 2 V
Lead Channel Setting Pacing Amplitude: 2.5 V
Lead Channel Setting Pacing Pulse Width: 0.4 ms
Lead Channel Setting Sensing Sensitivity: 0.3 mV
Zone Setting Status: 755011
Zone Setting Status: 755011

## 2023-03-26 ENCOUNTER — Other Ambulatory Visit: Payer: Self-pay | Admitting: Internal Medicine

## 2023-04-01 DIAGNOSIS — M5416 Radiculopathy, lumbar region: Secondary | ICD-10-CM | POA: Diagnosis not present

## 2023-04-08 ENCOUNTER — Ambulatory Visit: Payer: Medicare HMO | Attending: Internal Medicine

## 2023-04-08 ENCOUNTER — Encounter: Payer: Self-pay | Admitting: Internal Medicine

## 2023-04-08 DIAGNOSIS — Z95810 Presence of automatic (implantable) cardiac defibrillator: Secondary | ICD-10-CM | POA: Diagnosis not present

## 2023-04-08 DIAGNOSIS — I428 Other cardiomyopathies: Secondary | ICD-10-CM

## 2023-04-09 DIAGNOSIS — R300 Dysuria: Secondary | ICD-10-CM | POA: Diagnosis not present

## 2023-04-09 DIAGNOSIS — R8281 Pyuria: Secondary | ICD-10-CM | POA: Diagnosis not present

## 2023-04-09 DIAGNOSIS — R319 Hematuria, unspecified: Secondary | ICD-10-CM | POA: Diagnosis not present

## 2023-04-09 NOTE — Progress Notes (Signed)
 EPIC Encounter for ICM Monitoring  Patient Name: Michelle Castro is a 73 y.o. female Date: 04/09/2023 Primary Care Physican: Street, Stephanie Coup, MD Primary Cardiologist: Graciela Husbands Electrophysiologist: Graciela Husbands 04/26/2021 Office Weight: 210 lbs 12/26/2022 Weight: 201 lbs 04/09/2023 Weight: 201-202 lbs    Time in AT/AF  0.0 hr/day (0.0%)                                                            Spoke with patient and heart failure questions reviewed.  Transmission results reviewed.  Pt asymptomatic for fluid accumulation.  Reports feeling well at this time and voices no complaints.     Diet:  Is not strict with fluid or salt intake   Optivol thoracic impedance suggesting possible fluid accumulation starting 2/29.   Prescribed:  Spironolactone 25 mg take 0.5 tablet (12.5 mg total) by mouth daily.   Labs: 03/17/2021 Creatinine 1.14, BUN 16, Potassium 4.5, Sodium 137, GFR 51   Recommendations:  Advised to limit salt intake.  No changes and encouraged to call if experiencing any fluid symptoms.   Follow-up plan: ICM clinic phone appointment on 05/13/2023.   91 day device clinic remote transmission 5/142/2025.     EP/Cardiology Office Visits:  Recall 08/02/2023 with Dr Graciela Husbands.    Copy of ICM check sent to Dr. Graciela Husbands.  3 month ICM trend: 04/08/2023.    12-14 Month ICM trend:     Karie Soda, RN 04/09/2023 1:59 PM

## 2023-04-24 NOTE — Progress Notes (Signed)
 Remote ICD transmission.

## 2023-04-24 NOTE — Addendum Note (Signed)
 Addended by: Elease Etienne A on: 04/24/2023 04:46 PM   Modules accepted: Orders

## 2023-05-06 DIAGNOSIS — M19012 Primary osteoarthritis, left shoulder: Secondary | ICD-10-CM | POA: Diagnosis not present

## 2023-05-13 ENCOUNTER — Ambulatory Visit: Attending: Internal Medicine

## 2023-05-13 DIAGNOSIS — I428 Other cardiomyopathies: Secondary | ICD-10-CM

## 2023-05-13 DIAGNOSIS — Z95810 Presence of automatic (implantable) cardiac defibrillator: Secondary | ICD-10-CM

## 2023-05-14 ENCOUNTER — Encounter: Payer: Self-pay | Admitting: Internal Medicine

## 2023-05-14 ENCOUNTER — Telehealth: Payer: Self-pay

## 2023-05-14 ENCOUNTER — Other Ambulatory Visit: Payer: Self-pay

## 2023-05-14 ENCOUNTER — Telehealth: Payer: Self-pay | Admitting: *Deleted

## 2023-05-14 NOTE — Telephone Encounter (Signed)
 Patient has been scheduled med rec and consent done     Patient Consent for Virtual Visit         Michelle Castro has provided verbal consent on 05/14/2023 for a virtual visit (video or telephone).   CONSENT FOR VIRTUAL VISIT FOR:  Michelle Castro  By participating in this virtual visit I agree to the following:  I hereby voluntarily request, consent and authorize [facility] and its employed or contracted physicians, physician assistants, nurse practitioners or other licensed health care professionals (the Practitioner), to provide me with telemedicine health care services (the "Services") as deemed necessary by the treating Practitioner. I acknowledge and consent to receive the Services by the Practitioner via telemedicine. I understand that the telemedicine visit will involve communicating with the Practitioner through live audiovisual communication technology and the disclosure of certain medical information by electronic transmission. I acknowledge that I have been given the opportunity to request an in-person assessment or other available alternative prior to the telemedicine visit and am voluntarily participating in the telemedicine visit.  I understand that I have the right to withhold or withdraw my consent to the use of telemedicine in the course of my care at any time, without affecting my right to future care or treatment, and that the Practitioner or I may terminate the telemedicine visit at any time. I understand that I have the right to inspect all information obtained and/or recorded in the course of the telemedicine visit and may receive copies of available information for a reasonable fee.  I understand that some of the potential risks of receiving the Services via telemedicine include:  Delay or interruption in medical evaluation due to technological equipment failure or disruption; Information transmitted may not be sufficient (e.g. poor resolution of images) to allow for  appropriate medical decision making by the Practitioner; and/or  In rare instances, security protocols could fail, causing a breach of personal health information.  Furthermore, I acknowledge that it is my responsibility to provide information about my medical history, conditions and care that is complete and accurate to the best of my ability. I acknowledge that Practitioner's advice, recommendations, and/or decision may be based on factors not within their control, such as incomplete or inaccurate data provided by me or distortions of diagnostic images or specimens that may result from electronic transmissions. I understand that the practice of medicine is not an exact science and that Practitioner makes no warranties or guarantees regarding treatment outcomes. I acknowledge that a copy of this consent can be made available to me via my patient portal [facility] MyChart), or I can request a printed copy by calling the office of [facility].    I understand that my insurance will be billed for this visit.   I have read or had this consent read to me. I understand the contents of this consent, which adequately explains the benefits and risks of the Services being provided via telemedicine.  I have been provided ample opportunity to ask questions regarding this consent and the Services and have had my questions answered to my satisfaction. I give my informed consent for the services to be provided through the use of telemedicine in my medical care

## 2023-05-14 NOTE — Telephone Encounter (Signed)
 Called and spoke to patient who has been scheduled for telephone appt for clearance med rec and consent done

## 2023-05-14 NOTE — Progress Notes (Signed)
 PERIOPERATIVE PRESCRIPTION FOR IMPLANTED CARDIAC DEVICE PROGRAMMING   Patient Information:  Patient: Michelle Castro  MRN: [id number]  Date of Birth: Jun 05, 1950   Procedure:   COMPLEX CATARACT W/ISTENT/HYDRUS-RIGHT EYE THEN LEFT EYE Date of Surgery:  Clearance 05/27/23                              Surgeon:  DR. Nada Libman Surgeon's Group or Practice Name:  [facility]  Phone number:  [phone] Fax number:  [phone]  Device Information:   Clinic EP Physician:   Dr. Sherryl Manges Device Type:  Defibrillator Manufacturer and Phone #:  Medtronic: [phone] Pacemaker Dependent?:  Yes Date of Last Device Check:  05/13/2023        Normal Device Function?:  Yes     Electrophysiologist's Recommendations:   Have magnet available. Provide continuous ECG monitoring when magnet is used or reprogramming is to be performed.  Procedure may interfere with device function.  Magnet should be placed over device during procedure.  Per Device Clinic Standing Orders, Lenor Coffin  05/14/2023 3:24 PM

## 2023-05-14 NOTE — Telephone Encounter (Signed)
   Pre-operative Risk Assessment    Patient Name: Michelle Castro  DOB: 1950/10/13 MRN: [id number]   Date of last office visit: 11/05/22 DR. KLEIN Date of next office visit: NONE  NOTES ON REQUEST: Patient see for preop assessment, pt noted PPM/ICD for long QT and unknown arrhythmias. Pt stated has been instructed in the past had "fluid around my heart". Pt denies recent sx in the last 3 months though unable to clarify actual diagnosis of CHF or CAD. Requesting risk stratification and clearance for surgeries above. If able to provide cardiac medical history would be helpful due to pt is a unreliable historian.     Request for Surgical Clearance    Procedure:   COMPLEX CATARACT W/ISTENT/HYDRUS-RIGHT EYE THEN LEFT EYE  Date of Surgery:  Clearance 05/27/23                                Surgeon:  DR. Nada Libman Surgeon's Group or Practice Name:  [facility]  Phone number:  [phone] Fax number:  [phone]   Type of Clearance Requested:   - Medical ; NONE INDICATED TO BE HELD   Type of Anesthesia:   IV SEDATION   Additional requests/questions:    [name]   05/14/2023, 1:07 PM

## 2023-05-14 NOTE — Telephone Encounter (Signed)
   Name: Michelle Castro  DOB: March 18, 1950  MRN: [id number]  Primary Cardiologist: None   Preoperative team, please contact this patient and set up a phone call appointment for further preoperative risk assessment. Please obtain consent and complete medication review. Thank you for your help.  Requesting office has asked for risk stratification and clearance.  Given upcoming cataract procedure that is complex with iStent / Hydrus will arrange for telephone visit.  I confirm that guidance regarding antiplatelet and oral anticoagulation therapy has been completed and, if necessary, noted below.  Patient is not on any antiplatelet or anticoagulation medication.  I also confirmed the patient resides in the state of [place]. As per [facility] Medical Board telemedicine laws, the patient must reside in the state in which the provider is licensed.   Denyce Robert, NP 05/14/2023, 2:48 PM [facility]

## 2023-05-16 DIAGNOSIS — R300 Dysuria: Secondary | ICD-10-CM | POA: Diagnosis not present

## 2023-05-16 DIAGNOSIS — N3000 Acute cystitis without hematuria: Secondary | ICD-10-CM | POA: Diagnosis not present

## 2023-05-16 NOTE — Progress Notes (Signed)
 EPIC Encounter for ICM Monitoring  Patient Name: Michelle Castro is a 73 y.o. female Date: 05/16/2023 Primary Care Physican: Street, Stephanie Coup, MD Primary Cardiologist: Graciela Husbands Electrophysiologist: Graciela Husbands 04/26/2021 Office Weight: 210 lbs 12/26/2022 Weight: 201 lbs 04/09/2023 Weight: 201-202 lbs  05/16/2023 Weight: 197 lbs   Time in AT/AF  0.0 hr/day (0.0%)    VT-NS (>4 beats, >188 bpm)  3                                                         Spoke with patient and heart failure questions reviewed.  Transmission results reviewed.  Pt asymptomatic for fluid accumulation.  No symptoms during decreased impedance.  Reports feeling well at this time and voices no complaints.  She has cataract surgery.   Diet:  No changes in diet and is not strict with fluid or salt intake. She has decreased fluid intake recently.   Optivol thoracic impedance suggesting possible fluid accumulation from 3/28-4/4 and 3/15-3/18.   Prescribed:  Spironolactone 25 mg take 0.5 tablet (12.5 mg total) by mouth daily.   Labs: 03/17/2021 Creatinine 1.14, BUN 16, Potassium 4.5, Sodium 137, GFR 51   Recommendations:  Advised to limit salt intake.  No changes and encouraged to call if experiencing any fluid symptoms.   Follow-up plan: ICM clinic phone appointment on 06/21/2023.   91 day device clinic remote transmission 06/19/2023.     EP/Cardiology Office Visits:  Recall 08/02/2023 with Dr Graciela Husbands.   Provided EP scheduler number to make appt with Dr Graciela Husbands.   Copy of ICM check sent to Dr. Graciela Husbands.   3 month ICM trend: 05/13/2023.    12-14 Month ICM trend:     Karie Soda, RN 05/16/2023 10:58 AM

## 2023-05-17 ENCOUNTER — Ambulatory Visit: Attending: Cardiology

## 2023-05-17 DIAGNOSIS — Z01810 Encounter for preprocedural cardiovascular examination: Secondary | ICD-10-CM

## 2023-05-17 NOTE — Progress Notes (Signed)
 Virtual Visit via Telephone Note   Because of Michelle Castro co-morbid illnesses, she is at least at moderate risk for complications without adequate follow up.  This format is felt to be most appropriate for this patient at this time.  Due to technical limitations with video connection (technology), today's appointment will be conducted as an audio only telehealth visit, and Michelle Castro verbally agreed to proceed in this manner.   All issues noted in this document were discussed and addressed.  No physical exam could be performed with this format.  Evaluation Performed:  Preoperative cardiovascular risk assessment _____________   Date:  05/17/2023   Patient ID:  Michelle Castro, DOB Jan 03, 1951, MRN [id number] Patient Location:  Home Provider location:   Office  Primary Care Provider:  Street, Michelle Coup, MD Primary Cardiologist:  None  Chief Complaint / Patient Profile   73 y.o. y/o female with a h/o VT/VF with long QT, HTN, breast cancer (L mastectomy), known anomalous take off of the RCA from [facility], Afib, NICM, RBB  who is pending complex cataract procedure and presents today for telephonic preoperative cardiovascular risk assessment.  History of Present Illness    Michelle Castro is a 73 y.o. female who presents via audio/video conferencing for a telehealth visit today.  Pt was last seen in cardiology clinic on 11/05/22 by Dr. Graciela Castro.  At that time Michelle Castro was doing well with no ICD shocks, palpitations or lightheadedness.  She had a complaint of epigastric discomfort and underwent CTA that was unrevealing.  The patient is now pending procedure as outlined above. Since her last visit, she has been doing well with no new cardiac complaints.  She is very active and maintaining an active lifestyle without any deficiencies in her ADLs.  She denies chest pain, shortness of breath, lower extremity edema, fatigue, palpitations, melena, hematuria, hemoptysis, diaphoresis, weakness, presyncope,  syncope, orthopnea, and PND.   Past Medical History    Past Medical History:  Diagnosis Date   Anomalous coronary artery origin    RCA come off the left coronary cusp   Arthritis    Breast cancer (HCC) 1986   s/p :L mastectomy   Dementia (HCC)    Depression/ anxiety    Hypertension    Long Q-T syndrome    gene testing pending   PAF (paroxysmal atrial fibrillation) (HCC)    not well documented   PFO (patent foramen ovale)    Syncope    Ventricular tachycardia, polymorphic (HCC)    long-short   Past Surgical History:  Procedure Laterality Date   ABDOMINAL HYSTERECTOMY     AUGMENTATION MAMMAPLASTY     BREAST BIOPSY Right    BREAST SURGERY     mastectomy   ICD GENERATOR CHANGEOUT N/A 09/23/2019   Procedure: ICD GENERATOR CHANGEOUT;  Surgeon: Duke Salvia, MD;  Location: [facility] INVASIVE CV LAB;  Service: Cardiovascular;  Laterality: N/A;   IMPLANTABLE CARDIOVERTER DEFIBRILLATOR IMPLANT N/A 03/05/2012   MDT dual chamber ICD implanted for secondary prevention for Long QT syndrome   MASTECTOMY Left    TOTAL KNEE ARTHROPLASTY     bilateral    Allergies  Allergies  Allergen Reactions   Codeine Nausea Only    Home Medications    Prior to Admission medications   Medication Sig Start Date End Date Taking? Authorizing Provider  b complex vitamins capsule Take 1 capsule by mouth daily.    [provider]  diazepam (VALIUM) 5 MG tablet TAKE 1/2 TO 1 TAB BY MOUTH  DAILY AT BEDTIME AS NEEDED FOR SLEEP. MAY TAKING IN DAYTIME AS NEEDED Patient not taking: Reported on 11/05/2022 10/10/21   [provider]  dicyclomine (BENTYL) 20 MG tablet Take 20 mg by mouth 4 (four) times daily as needed (stomach pain).  09/09/19   [provider]  empagliflozin (JARDIANCE) 10 MG TABS tablet Take 1 tablet (10 mg total) by mouth daily before breakfast. 12/04/21   Sheilah Pigeon, PA-C  famotidine (PEPCID) 40 MG tablet Take 40 mg by mouth daily. 02/21/23   [provider]   latanoprost (XALATAN) 0.005 % ophthalmic solution Place 1 drop into both eyes at bedtime.    [provider]  LORazepam (ATIVAN) 1 MG tablet Take 1 mg by mouth as directed.    [provider]  meloxicam (MOBIC) 15 MG tablet Take 1 tablet by mouth daily. 11/27/19   [provider]  Multiple Vitamin (MULTIVITAMIN WITH MINERALS) TABS Take 1 tablet by mouth daily.    [provider]  pantoprazole (PROTONIX) 40 MG tablet Take 40 mg by mouth daily. 04/27/23   [provider]  propranolol ER (INDERAL LA) 120 MG 24 hr capsule TAKE TWO CAPSULES IN THE MORNING AND ONE CAPSULE BY MOUTH EVERY EVENING 03/26/23   Duke Salvia, MD  spironolactone (ALDACTONE) 25 MG tablet TAKE 1/2 TABLET BY MOUTH EVERY DAY 12/27/22   Sheilah Pigeon, PA-C  tolterodine (DETROL LA) 4 MG 24 hr capsule Take 4 mg by mouth daily. 12/09/20   [provider]  valACYclovir (VALTREX) 500 MG tablet Take 500 mg by mouth daily. 01/14/21   [provider]  venlafaxine XR (EFFEXOR-XR) 37.5 MG 24 hr capsule Take 37.5 mg by mouth daily with breakfast.    [provider]    Physical Exam    Vital Signs:  [name] does not have vital signs available for review today. 130/70  Given telephonic nature of communication, physical exam is limited. AAOx3. NAD. Normal affect.  Speech and respirations are unlabored.  Accessory Clinical Findings    None  Assessment & Plan    1.  Preoperative Cardiovascular Risk Assessment: -Patient's RCRI score 0.9%  The patient affirms she has been doing well without any new cardiac symptoms. They are able to achieve 7 METS without cardiac limitations. Therefore, based on ACC/AHA guidelines, the patient would be at acceptable risk for the planned procedure without further cardiovascular testing. The patient was advised that if she develops new symptoms prior to surgery to contact our office to arrange for a follow-up visit, and she  verbalized understanding.   The patient was advised that if she develops new symptoms prior to surgery to contact our office to arrange for a follow-up visit, and she verbalized understanding.  A copy of this note will be routed to requesting surgeon.  Time:   Today, I have spent 6 minutes with the patient with telehealth technology discussing medical history, symptoms, and management plan.     Napoleon Form, Leodis Rains, NP  05/17/2023, 7:12 AM

## 2023-05-22 DIAGNOSIS — L570 Actinic keratosis: Secondary | ICD-10-CM | POA: Diagnosis not present

## 2023-05-22 DIAGNOSIS — I781 Nevus, non-neoplastic: Secondary | ICD-10-CM | POA: Diagnosis not present

## 2023-05-27 DIAGNOSIS — H25811 Combined forms of age-related cataract, right eye: Secondary | ICD-10-CM | POA: Diagnosis not present

## 2023-05-27 DIAGNOSIS — I110 Hypertensive heart disease with heart failure: Secondary | ICD-10-CM | POA: Diagnosis not present

## 2023-05-27 DIAGNOSIS — I509 Heart failure, unspecified: Secondary | ICD-10-CM | POA: Diagnosis not present

## 2023-05-27 DIAGNOSIS — H401112 Primary open-angle glaucoma, right eye, moderate stage: Secondary | ICD-10-CM | POA: Diagnosis not present

## 2023-06-10 DIAGNOSIS — H25812 Combined forms of age-related cataract, left eye: Secondary | ICD-10-CM | POA: Diagnosis not present

## 2023-06-10 DIAGNOSIS — H2512 Age-related nuclear cataract, left eye: Secondary | ICD-10-CM | POA: Diagnosis not present

## 2023-06-10 DIAGNOSIS — H401121 Primary open-angle glaucoma, left eye, mild stage: Secondary | ICD-10-CM | POA: Diagnosis not present

## 2023-06-18 DIAGNOSIS — M5416 Radiculopathy, lumbar region: Secondary | ICD-10-CM | POA: Diagnosis not present

## 2023-06-19 ENCOUNTER — Ambulatory Visit (INDEPENDENT_AMBULATORY_CARE_PROVIDER_SITE_OTHER): Payer: Medicare HMO

## 2023-06-19 DIAGNOSIS — M5416 Radiculopathy, lumbar region: Secondary | ICD-10-CM | POA: Diagnosis not present

## 2023-06-19 DIAGNOSIS — I428 Other cardiomyopathies: Secondary | ICD-10-CM

## 2023-06-19 LAB — CUP PACEART REMOTE DEVICE CHECK
Battery Remaining Longevity: 68 mo
Battery Voltage: 2.96 V
Brady Statistic AP VP Percent: 30.86 %
Brady Statistic AP VS Percent: 0.07 %
Brady Statistic AS VP Percent: 68.62 %
Brady Statistic AS VS Percent: 0.45 %
Brady Statistic RA Percent Paced: 30.47 %
Brady Statistic RV Percent Paced: 98.27 %
Date Time Interrogation Session: 20250514001604
HighPow Impedance: 77 Ohm
Implantable Lead Connection Status: 753985
Implantable Lead Connection Status: 753985
Implantable Lead Implant Date: 20140129
Implantable Lead Implant Date: 20140129
Implantable Lead Location: 753859
Implantable Lead Location: 753860
Implantable Lead Model: 180
Implantable Lead Model: 5076
Implantable Lead Serial Number: 310373
Implantable Pulse Generator Implant Date: 20210818
Lead Channel Impedance Value: 437 Ohm
Lead Channel Impedance Value: 437 Ohm
Lead Channel Impedance Value: 456 Ohm
Lead Channel Pacing Threshold Amplitude: 0.5 V
Lead Channel Pacing Threshold Amplitude: 0.875 V
Lead Channel Pacing Threshold Pulse Width: 0.4 ms
Lead Channel Pacing Threshold Pulse Width: 0.4 ms
Lead Channel Sensing Intrinsic Amplitude: 0.75 mV
Lead Channel Sensing Intrinsic Amplitude: 0.75 mV
Lead Channel Sensing Intrinsic Amplitude: 13.75 mV
Lead Channel Sensing Intrinsic Amplitude: 13.75 mV
Lead Channel Setting Pacing Amplitude: 2 V
Lead Channel Setting Pacing Amplitude: 2.5 V
Lead Channel Setting Pacing Pulse Width: 0.4 ms
Lead Channel Setting Sensing Sensitivity: 0.3 mV
Zone Setting Status: 755011
Zone Setting Status: 755011

## 2023-06-21 ENCOUNTER — Ambulatory Visit: Attending: Internal Medicine

## 2023-06-21 DIAGNOSIS — I428 Other cardiomyopathies: Secondary | ICD-10-CM | POA: Diagnosis not present

## 2023-06-21 DIAGNOSIS — Z95810 Presence of automatic (implantable) cardiac defibrillator: Secondary | ICD-10-CM | POA: Diagnosis not present

## 2023-06-21 NOTE — Progress Notes (Signed)
 EPIC Encounter for ICM Monitoring  Patient Name: Michelle Castro is a 73 y.o. female Date: 06/21/2023 Primary Care Physican: Street, Renford Cartwright, MD Primary Cardiologist:  [facility] Electrophysiologist: Lawana Pray 04/26/2021 Office Weight: 210 lbs 12/26/2022 Weight: 201 lbs 04/09/2023 Weight: 201-202 lbs  05/16/2023 Weight: 197 lbs   Time in AT/AF  0.0 hr/day (0.0%)    VT-NS (>4 beats, >188 bpm)  3                                                         Transmission results reviewed.     Diet:  No changes in diet and is not strict with fluid or salt intake. She has decreased fluid intake recently.   Optivol thoracic impedance suggesting intermittent days with possible fluid accumulation.   Prescribed:  Spironolactone  25 mg take 0.5 tablet (12.5 mg total) by mouth daily.   Labs: 03/17/2021 Creatinine 1.14, BUN 16, Potassium 4.5, Sodium 137, GFR 51   Recommendations:  No changes.   Follow-up plan: ICM clinic phone appointment on 07/22/2023.   91 day device clinic remote transmission 09/18/2023.     EP/Cardiology Office Visits:  Recall 08/02/2023 with Dr Lawana Pray.   08/27/2023 with Dr Rodolfo Clan.   Copy of ICM check sent to Dr. Rodolfo Clan.   3 month ICM trend: 06/19/2023.    12-14 Month ICM trend:     Almyra Jain, RN 06/21/2023 4:41 PM

## 2023-06-24 ENCOUNTER — Ambulatory Visit: Payer: Self-pay | Admitting: Cardiology

## 2023-07-22 ENCOUNTER — Ambulatory Visit: Attending: Cardiology

## 2023-07-22 DIAGNOSIS — Z95810 Presence of automatic (implantable) cardiac defibrillator: Secondary | ICD-10-CM

## 2023-07-22 DIAGNOSIS — I428 Other cardiomyopathies: Secondary | ICD-10-CM | POA: Diagnosis not present

## 2023-07-24 ENCOUNTER — Telehealth: Payer: Self-pay

## 2023-07-24 NOTE — Telephone Encounter (Signed)
Remote ICM transmission received.  Attempted call to patient regarding ICM remote transmission and left detailed message per DPR.  Left ICM phone number and advised to return call for any fluid symptoms or questions.

## 2023-07-24 NOTE — Progress Notes (Signed)
 EPIC Encounter for ICM Monitoring  Patient Name: Michelle Castro is a 73 y.o. female Date: 07/24/2023 Primary Care Physican: Street, Renford Cartwright, MD rimary Cardiologist:  [facility] Electrophysiologist: Lawana Pray 04/26/2021 Office Weight: 210 lbs 12/26/2022 Weight: 201 lbs 04/09/2023 Weight: 201-202 lbs  05/16/2023 Weight: 197 lbs   Time in AT/AF  0.0 hr/day (0.0%)                                                     Attempted call to patient and unable to reach.  Left detailed message per DPR regarding transmission.  Transmission results reviewed.    Diet:  No changes in diet and is not strict with fluid or salt intake. She has decreased fluid intake recently.   Optivol thoracic impedance suggesting intermittent days with possible fluid accumulation.   Prescribed:  Spironolactone  25 mg take 0.5 tablet (12.5 mg total) by mouth daily.   Recommendations:    Left voice mail with ICM number and encouraged to call if experiencing any fluid symptoms.   Follow-up plan: ICM clinic phone appointment on 09/09/2023.   91 day device clinic remote transmission 09/18/2023.     EP/Cardiology Office Visits:  Recall 08/02/2023 with Dr Lawana Pray.   08/27/2023 with Dr Rodolfo Clan.   Copy of ICM check sent to Dr. Lawana Pray.   3 month ICM trend: 07/22/2023.    12-14 Month ICM trend:     Almyra Jain, RN 07/24/2023 4:43 PM

## 2023-07-30 NOTE — Progress Notes (Signed)
 Remote ICD transmission.

## 2023-08-08 ENCOUNTER — Telehealth: Payer: Self-pay

## 2023-08-08 DIAGNOSIS — I472 Ventricular tachycardia, unspecified: Secondary | ICD-10-CM

## 2023-08-08 DIAGNOSIS — I428 Other cardiomyopathies: Secondary | ICD-10-CM

## 2023-08-08 NOTE — Telephone Encounter (Signed)
 Spoke with daughter.  Advised of medication change.  Advised daughter would call her Monday with results of labs and follow up appointment.  Daughter thanked Engineer, civil (consulting) for call.

## 2023-08-08 NOTE — Telephone Encounter (Signed)
 Reviewed with Dr. Waddell.  Will have Pt increase Propranolol  ER 120 mg to 2 tablets in AM and PM and follow up with Dr. Inocencio in [place].

## 2023-08-08 NOTE — Telephone Encounter (Addendum)
 Alert received from [facility] [employer]:  3 Treated VT episodes recorded on 08/05/23 at 08:20, 23:41, and 23:47, longest 14 sec. EGMs c/w VT successfully terminated with ATP x 1 Burst per event, avg V rates 207-214 bpm; Routed to clinic for review.  22 VT-NS classified episodes, longest 7 sec, EGMs c/w NSVT, avg V rates 214-231 bpm.  Outreach made to Pt.  Per Pt her dog passed away last 08/17/2023 and she has been very upset.  She states she has noticed that her heart has been racing.  States she has been dizzy at times.  She attributes this to the loss of her dog.  She also states she sometimes forgets to eat.  She states she has been taking her medication as ordered.  Advised per [place] DMV guidelines she should not drive for 6 months post appropriate therapy from her device.    Requested Pt get lab work from [employer].  She will have her family drive her.

## 2023-08-09 LAB — BASIC METABOLIC PANEL WITH GFR
BUN/Creatinine Ratio: 16 (ref 12–28)
BUN: 23 mg/dL (ref 8–27)
CO2: 21 mmol/L (ref 20–29)
Calcium: 9.8 mg/dL (ref 8.7–10.3)
Chloride: 103 mmol/L (ref 96–106)
Creatinine, Ser: 1.42 mg/dL — ABNORMAL HIGH (ref 0.57–1.00)
Glucose: 107 mg/dL — ABNORMAL HIGH (ref 70–99)
Potassium: 4.7 mmol/L (ref 3.5–5.2)
Sodium: 140 mmol/L (ref 134–144)
eGFR: 39 mL/min/1.73 — ABNORMAL LOW (ref >59)

## 2023-08-09 LAB — MAGNESIUM: Magnesium: 2 mg/dL (ref 1.6–2.3)

## 2023-08-12 NOTE — Telephone Encounter (Signed)
 Left message requesting call back.  Will schedule Pt to see Dr. Inocencio this Friday August 16, 2023 mid morning.

## 2023-08-12 NOTE — Telephone Encounter (Signed)
 Call back received from Pt.  Pt scheduled to see Dr. Inocencio August 16, 2023 at 10:30 am.  Directions given to new [facility] building.

## 2023-08-14 DIAGNOSIS — M6289 Other specified disorders of muscle: Secondary | ICD-10-CM | POA: Diagnosis not present

## 2023-08-14 NOTE — Progress Notes (Signed)
 Difficult penetration.  Tightens up with intercourse. He's has BPH Rec pelvic floor relaxation techiniques. Declines PT    CHIEF COMPLAINT: Chief Complaint  Patient presents with  . Annual Exam    HPI:  [name] is 73 y.o. who presents with c/o difficulty with penetration to initiate intercourse.  Her partner does have some degree of ED but she notes tightening up of her pelvic floor muscles during intercourse. They do use a lubricant. She denies pain or bleeding with intercourse.     ALLERGIES: Allergies[1]  MEDICATIONS: Medications Ordered Prior to Encounter[2]  PAST MEDICAL HISTORY:  has a past medical history of Atrial fibrillation    (CMD), Breast cancer    (CMD), Chronic clinical systolic heart failure    (CMD), Depression, ICD (implantable cardioverter-defibrillator) in place, Long Q-T syndrome, Osteoarthritis, Polymorphic ventricular tachycardia    (CMD), and Reflux gastritis.  PAST SURGICAL HISTORY: Surgical History[3]  OBSTETRIC HISTORY:  [id number]  GYNECOLOGIC HISTORY:    FAMILY HISTORY:  family history includes Cancer in her father; Hypertension in her father and mother; Osteoporosis in her father and mother; Prostate cancer in her father; Rheumatologic disease in her mother.  SOCIAL HISTORY: Social History   Socioeconomic History  . Marital status: Divorced    Spouse name: Not on file  . Number of children: Not on file  . Years of education: Not on file  . Highest education level: Not on file  Occupational History  . Not on file  Tobacco Use  . Smoking status: Never  . Smokeless tobacco: Never  Substance and Sexual Activity  . Alcohol use: Yes  . Drug use: Never  . Sexual activity: Not on file  Other Topics Concern  . Not on file  Social History Narrative  . Not on file   Social Drivers of Health   Food Insecurity: Not on file  Transportation Needs: Not on file  Safety: Not on file  Living Situation: Not on file    REVIEW OF  SYSTEMS:  GENERAL: no fevers, chills, significant changes in weight  GASTROINTESTINAL: no nausea, vomiting or diarrhea, no abdominal pain, regular bowel movements GENITOURINARY: no dysuria, no vaginal discharge or irritation, denies hematuria or incomplete emptying when voiding. SKIN: no rashes or lesions PSYCHIATRY: no changes in mood, no anxiety or depression.    PHYSICAL EXAM: BP 126/78   Wt 92.5 kg (204 lb)   BMI 31.02 kg/m  Body mass index is 31.02 kg/m.   GENERAL APPEARANCE:  Well developed, well groomed.  No acute distress. Color good. MENTAL STATUS: Appears alert and oriented. Affect appropriate. SKIN: Skin color and turgor normal.  HEAD: Normocephalic.   GU: Atrophic vulva with a normal caliber introitus.  No masses, lesions, rashes, or swelling. Vaginal mucosa  pink and moist without exudates, lesions, or ulcerations. Bimanual negative for palpable masses.    MS: Muscle tone and strength normal for age, w/o atrophy or abnormal movement. EXTREMITIES: No clubbing, cyanosis or edema  A chaperone was present for the exam portion of the visit  ASSESSMENT , COUNSELING AND PLAN  1. Pelvic floor tension      2. Sexual dysfunction, psychological        Pt counseled on pelvic relaxation techniques and offered Pelvic floor PT which she declines at this time.         Electronically signed by: Lorrene GORMAN Sines, MD 08/15/2023 11:02 AM             [1] Allergies Allergen Reactions  .  Codeine GI Intolerance and Nausea Only  [2] Current Outpatient Medications on File Prior to Visit  Medication Sig Dispense Refill  . albuterol HFA (PROVENTIL HFA;VENTOLIN HFA;PROAIR HFA) 90 mcg/actuation inhaler TAKE 1 TO 2 PUFFS EVERY 4 HOURS AS NEEDED COUGH / WHEEZE / SOB    . busPIRone (BUSPAR) 15 mg tablet Take 15 mg by mouth.    . diazePAM  (VALIUM ) 5 mg tablet TAKE 1/2 TO 1 TAB BY MOUTH DAILY AT BEDTIME AS NEEDED FOR SLEEP. MAY TAKING IN DAYTIME AS NEEDED    . dicyclomine  (Bentyl) 20 mg tab Take 20 mg by mouth 3 (three) times a day.    . empagliflozin  (Jardiance ) 10 mg tab Take 10 mg by mouth.    . esomeprazole (NexIUM) 40 mg DR capsule Take 40 mg by mouth daily before breakfast.    . famotidine (PEPCID) 40 mg tablet Take 40 mg by mouth daily.    SABRA ketorolac (ACULAR) 0.5 % ophthalmic solution INSTILL 1 DROP BY OPHTHALMIC ROUTE 4 TIMES EVERY DAY INTO LEFT EYE UNTIL THE BOTTLE IS FINISHED    . latanoprost (XALATAN) 0.005 % ophthalmic solution Administer 1 drop into each eyes at bedtime.    SABRA LORazepam (ATIVAN) 1 mg tablet Take 1 mg by mouth every 6 (six) hours as needed for anxiety.    SABRA LORazepam (ATIVAN) 1 mg tablet Take 1 mg by mouth.    . meloxicam (MOBIC) 15 mg tablet TAKE 1 TABLET BY MOUTH EVERY DAY WITH A MEAL    . multivitamin,tx-minerals (Super Thera Vite M) tab Take  by mouth.    . pantoprazole (PROTONIX) 40 mg EC tablet Take 40 mg by mouth daily.    . propranoloL  (INDERAL  LA) 120 mg 24 hr capsule   3  . spironolactone  (ALDACTONE ) 25 mg tablet TAKE 1/2 TABLET BY MOUTH EVERY DAY  5  . spironolactone  (ALDACTONE ) 25 mg tablet Take 0.5 tablets by mouth daily.    SABRA tolterodine (DETROL LA) 4 mg ER capsule TAKE ONE CAPSULE BY MOUTH EVERYDAY 90 capsule 0  . valACYclovir (VALTREX) 500 mg tablet TAKE 1 TABLET BY MOUTH EVERY DAY  4  . venlafaxine (EFFEXOR XR) 37.5 mg 24 hr capsule Take 37.5 mg by mouth Once Daily.     No current facility-administered medications on file prior to visit.  [3] Past Surgical History: Procedure Laterality Date  . CARDIAC DEFIBRILLATOR PLACEMENT     Procedure: CARDIAC DEFIBRILLATOR PLACEMENT  . CHEILECTOMY Right 12/13/2021   Procedure: CHEVRON TYPE BUNIONECTOMY RIGHT FOOT WITH FIXATION (FOOT);  Surgeon: Myrick Elspeth Pac, DPM;  Location: [facility];  Service: Podiatry;  Laterality: Right;  . HYSTERECTOMY   2010   Procedure: HYSTERECTOMY  . KNEE SURGERY Bilateral 2012   Procedure: KNEE SURGERY; knee replacement  .  LAPAROSCOPIC APPENDECTOMY    . MASTECTOMY  1985   Procedure: MASTECTOMY  . OOPHORECTOMY Bilateral    Procedure: OOPHORECTOMY; age 89

## 2023-08-15 NOTE — Progress Notes (Signed)
    Electrophysiology Office Note:   Date:  08/16/2023  ID:  Michelle Castro, DOB Nov 16, 1950, MRN [id number]  Primary Cardiologist: None Primary Heart Failure: None Electrophysiologist: Briget Shaheed [name], MD      History of Present Illness:   Michelle Castro is a 73 y.o. female with h/o chronic systolic heart failure, long QT, polymorphic VT seen today for routine electrophysiology followup.   On 07/26/2023, she developed ventricular tachycardia requiring ATP therapy.  She converted to sinus rhythm without ICD shock.  Since last being seen in our clinic the patient reports doing overall well.  On the day of her ATP therapy, she thinks that she felt somewhat dizzy and lightheaded.  Aside from that, she is in her normal state of health and has no acute complaints.  she denies chest pain, palpitations, dyspnea, PND, orthopnea, nausea, vomiting, dizziness, syncope, edema, weight gain, or early satiety.   Review of systems complete and found to be negative unless listed in HPI.      EP Information / Studies Reviewed:    EKG is ordered today. Personal review as below.  EKG Interpretation Date/Time:  Friday August 16 2023 11:10:27 EDT Ventricular Rate:  63 PR Interval:  142 QRS Duration:  178 QT Interval:  486 QTC Calculation: 497 R Axis:   -85  Text Interpretation: Atrial-sensed ventricular-paced rhythm When compared with ECG of 05-Nov-2022 15:22, No significant change since last tracing Confirmed by Ivannah Zody ([id number]) on 08/16/2023 11:21:19 AM   ICD Interrogation-  reviewed in detail today,  See PACEART report.  Device History: Medtronic Dual Chamber ICD implanted 03/05/2012 for VT History of appropriate therapy: Yes History of AAD therapy: No   Risk Assessment/Calculations:            Physical Exam:   VS:  BP 122/80 (BP Location: Right Arm, Patient Position: Sitting, Cuff Size: Large)   Pulse 63   Ht 5' 7 (1.702 m)   Wt 200 lb (90.7 kg)   SpO2 95%   BMI 31.32 kg/m    Wt  Readings from Last 3 Encounters:  08/16/23 200 lb (90.7 kg)  11/05/22 212 lb (96.2 kg)  12/04/21 215 lb (97.5 kg)     GEN: Well nourished, well developed in no acute distress NECK: No JVD; No carotid bruits CARDIAC: Regular rate and rhythm, no murmurs, rubs, gallops RESPIRATORY:  Clear to auscultation without rales, wheezing or rhonchi  ABDOMEN: Soft, non-tender, non-distended EXTREMITIES:  No edema; No deformity   ASSESSMENT AND PLAN:    VT due to long QT syndrome s/p Medtronic dual chamber ICD  euvolemic today Stable on an appropriate medical regimen Normal ICD function Sensing, threshold, impedance within normal limits Programming appropriate Did receive ATP therapy, Inderal  has been increased with no further arrhythmia See Pace Art report No changes today  2.  Hypertension: Well-controlled  3.  Chronic systolic heart failure: Due to nonischemic cardiomyopathy.  On medical therapy.  Has heart block.  Sajan Cheatwood need repeat echocardiogram.  The patient for our and sees last note he said that she needed an echo at her next show  Disposition:   Follow up with EP APP in 6 months   Signed, Rashonda Warrior [name], MD

## 2023-08-16 ENCOUNTER — Ambulatory Visit: Attending: Cardiology | Admitting: Cardiology

## 2023-08-16 ENCOUNTER — Telehealth: Payer: Self-pay | Admitting: *Deleted

## 2023-08-16 ENCOUNTER — Encounter: Payer: Self-pay | Admitting: Cardiology

## 2023-08-16 VITALS — BP 122/80 | HR 63 | Ht 67.0 in | Wt 200.0 lb

## 2023-08-16 DIAGNOSIS — I472 Ventricular tachycardia, unspecified: Secondary | ICD-10-CM | POA: Diagnosis not present

## 2023-08-16 DIAGNOSIS — I428 Other cardiomyopathies: Secondary | ICD-10-CM

## 2023-08-16 DIAGNOSIS — Z95810 Presence of automatic (implantable) cardiac defibrillator: Secondary | ICD-10-CM | POA: Diagnosis not present

## 2023-08-16 DIAGNOSIS — I4581 Long QT syndrome: Secondary | ICD-10-CM | POA: Diagnosis not present

## 2023-08-16 LAB — CUP PACEART INCLINIC DEVICE CHECK
Date Time Interrogation Session: 20250711113601
Implantable Lead Connection Status: 753985
Implantable Lead Connection Status: 753985
Implantable Lead Implant Date: 20140129
Implantable Lead Implant Date: 20140129
Implantable Lead Location: 753859
Implantable Lead Location: 753860
Implantable Lead Model: 180
Implantable Lead Model: 5076
Implantable Lead Serial Number: 310373
Implantable Pulse Generator Implant Date: 20210818

## 2023-08-16 NOTE — Telephone Encounter (Signed)
 Advised pt Dr. Inocencio is ordering an echocardiogram and someone will call her to arrange in our [place] office.  Patient verbalized understanding and agreeable to plan.

## 2023-08-16 NOTE — Patient Instructions (Signed)
 Medication Instructions:  Your physician recommends that you continue on your current medications as directed. Please refer to the Current Medication list given to you today.  *If you need a refill on your cardiac medications before your next appointment, please call your pharmacy*  Lab Work: None ordered  If you have any lab test that is abnormal or we need to change your treatment, we will call you to review the results.  Testing/Procedures: None ordered  Follow-Up: At [facility], you and your health needs are our priority.  As part of our continuing mission to provide you with exceptional heart care, our providers are all part of one team.  This team includes your primary Cardiologist (physician) and Advanced Practice Providers or APPs (Physician Assistants and Nurse Practitioners) who all work together to provide you with the care you need, when you need it.  Your next appointment:   6 month(s)  Provider:   Charlies Arthur, PA-C     Thank you for choosing [facility]!!   Maeola Domino, RN [phone]

## 2023-08-26 ENCOUNTER — Ambulatory Visit: Attending: Cardiology

## 2023-08-26 DIAGNOSIS — I472 Ventricular tachycardia, unspecified: Secondary | ICD-10-CM

## 2023-08-26 DIAGNOSIS — I428 Other cardiomyopathies: Secondary | ICD-10-CM | POA: Diagnosis not present

## 2023-08-26 LAB — ECHOCARDIOGRAM COMPLETE
AR max vel: 1.38 cm2
AV Area VTI: 1.43 cm2
AV Area mean vel: 1.31 cm2
AV Mean grad: 3.5 mmHg
AV Peak grad: 6.8 mmHg
Ao pk vel: 1.31 m/s
Area-P 1/2: 2.76 cm2
Calc EF: 34.3 %
MV VTI: 1.25 cm2
MV Vena cont: 0.6 cm
S' Lateral: 4.3 cm
Single Plane A2C EF: 40 %
Single Plane A4C EF: 35.1 %

## 2023-08-26 MED ORDER — PERFLUTREN LIPID MICROSPHERE
1.0000 mL | INTRAVENOUS | Status: AC | PRN
  Administered 2023-08-26: 4 mL via INTRAVENOUS

## 2023-08-27 ENCOUNTER — Encounter: Admitting: Internal Medicine

## 2023-08-27 DIAGNOSIS — R0989 Other specified symptoms and signs involving the circulatory and respiratory systems: Secondary | ICD-10-CM | POA: Diagnosis not present

## 2023-08-27 DIAGNOSIS — N179 Acute kidney failure, unspecified: Secondary | ICD-10-CM | POA: Diagnosis not present

## 2023-08-27 DIAGNOSIS — I428 Other cardiomyopathies: Secondary | ICD-10-CM | POA: Diagnosis not present

## 2023-08-27 DIAGNOSIS — R739 Hyperglycemia, unspecified: Secondary | ICD-10-CM | POA: Diagnosis not present

## 2023-08-27 DIAGNOSIS — I472 Ventricular tachycardia, unspecified: Secondary | ICD-10-CM | POA: Diagnosis not present

## 2023-08-27 DIAGNOSIS — R7401 Elevation of levels of liver transaminase levels: Secondary | ICD-10-CM | POA: Diagnosis not present

## 2023-08-27 DIAGNOSIS — Z791 Long term (current) use of non-steroidal anti-inflammatories (NSAID): Secondary | ICD-10-CM | POA: Diagnosis not present

## 2023-08-27 DIAGNOSIS — I252 Old myocardial infarction: Secondary | ICD-10-CM | POA: Diagnosis not present

## 2023-08-27 DIAGNOSIS — M19012 Primary osteoarthritis, left shoulder: Secondary | ICD-10-CM | POA: Diagnosis not present

## 2023-08-27 DIAGNOSIS — M1990 Unspecified osteoarthritis, unspecified site: Secondary | ICD-10-CM | POA: Diagnosis not present

## 2023-08-27 DIAGNOSIS — H409 Unspecified glaucoma: Secondary | ICD-10-CM | POA: Diagnosis not present

## 2023-08-27 DIAGNOSIS — Z95810 Presence of automatic (implantable) cardiac defibrillator: Secondary | ICD-10-CM | POA: Diagnosis not present

## 2023-08-27 DIAGNOSIS — R079 Chest pain, unspecified: Secondary | ICD-10-CM | POA: Diagnosis not present

## 2023-08-27 DIAGNOSIS — Z885 Allergy status to narcotic agent status: Secondary | ICD-10-CM | POA: Diagnosis not present

## 2023-08-27 DIAGNOSIS — Z7984 Long term (current) use of oral hypoglycemic drugs: Secondary | ICD-10-CM | POA: Diagnosis not present

## 2023-08-27 DIAGNOSIS — R9431 Abnormal electrocardiogram [ECG] [EKG]: Secondary | ICD-10-CM | POA: Diagnosis not present

## 2023-08-27 DIAGNOSIS — Z79899 Other long term (current) drug therapy: Secondary | ICD-10-CM | POA: Diagnosis not present

## 2023-08-27 DIAGNOSIS — E860 Dehydration: Secondary | ICD-10-CM | POA: Diagnosis not present

## 2023-08-27 DIAGNOSIS — Z853 Personal history of malignant neoplasm of breast: Secondary | ICD-10-CM | POA: Diagnosis not present

## 2023-08-27 DIAGNOSIS — R42 Dizziness and giddiness: Secondary | ICD-10-CM | POA: Diagnosis not present

## 2023-08-27 DIAGNOSIS — K219 Gastro-esophageal reflux disease without esophagitis: Secondary | ICD-10-CM | POA: Diagnosis not present

## 2023-08-28 DIAGNOSIS — I428 Other cardiomyopathies: Secondary | ICD-10-CM | POA: Diagnosis not present

## 2023-08-28 DIAGNOSIS — I429 Cardiomyopathy, unspecified: Secondary | ICD-10-CM | POA: Diagnosis not present

## 2023-08-28 DIAGNOSIS — R079 Chest pain, unspecified: Secondary | ICD-10-CM | POA: Diagnosis not present

## 2023-08-28 DIAGNOSIS — I472 Ventricular tachycardia, unspecified: Secondary | ICD-10-CM | POA: Diagnosis not present

## 2023-08-28 DIAGNOSIS — Z95810 Presence of automatic (implantable) cardiac defibrillator: Secondary | ICD-10-CM | POA: Diagnosis not present

## 2023-08-28 DIAGNOSIS — N179 Acute kidney failure, unspecified: Secondary | ICD-10-CM | POA: Diagnosis not present

## 2023-08-28 DIAGNOSIS — R42 Dizziness and giddiness: Secondary | ICD-10-CM | POA: Diagnosis not present

## 2023-08-29 ENCOUNTER — Telehealth: Payer: Self-pay | Admitting: Cardiology

## 2023-08-29 NOTE — Telephone Encounter (Signed)
 Attempted to call the patient back. No answer- I left a message to please call back to the Device Clinic.

## 2023-08-29 NOTE — Telephone Encounter (Signed)
 Notification received from [employer]: Point of Service transmission: [facility] ED. Normal device function.   10 VHR episodes, most on 08/27/23 between 17:59 and 18:02, longest 2 min 31 sec on 08/25/23, EGMs c/w VT, V rates 182-214 bpm; routed to clinic for update.   Outreach made to patient: Advised the patient I was calling to follow up on an alert we had received notifying us  that her device was checked at [facility].  Per the patient, she did go [facility] on Tuesday 08/27/23 for symptoms of chest heaviness and SOB.  Inquired if any medications changes were made at that time, or what had transpired during her visit. Per the patient, she was in a store and her medications were in the car. I advised I would call back at a later time. The patient asked that I call back in ~ 1 hour.

## 2023-09-03 NOTE — Telephone Encounter (Signed)
 Outreach made to daughter.  Per daughter, when Pt was at [place] they reprogrammed her VT zone and started Pt on losartan.  Pt scheduled for follow up with Dr. Inocencio September 16, 2023 at 11:30 am post hospitalization/post echo results.  Daughter indicates understanding.    Will continue to monitor.

## 2023-09-04 DIAGNOSIS — M8580 Other specified disorders of bone density and structure, unspecified site: Secondary | ICD-10-CM | POA: Diagnosis not present

## 2023-09-04 DIAGNOSIS — E785 Hyperlipidemia, unspecified: Secondary | ICD-10-CM | POA: Diagnosis not present

## 2023-09-04 DIAGNOSIS — I5020 Unspecified systolic (congestive) heart failure: Secondary | ICD-10-CM | POA: Diagnosis not present

## 2023-09-04 DIAGNOSIS — T466X5A Adverse effect of antihyperlipidemic and antiarteriosclerotic drugs, initial encounter: Secondary | ICD-10-CM | POA: Diagnosis not present

## 2023-09-04 DIAGNOSIS — Z131 Encounter for screening for diabetes mellitus: Secondary | ICD-10-CM | POA: Diagnosis not present

## 2023-09-04 DIAGNOSIS — Z780 Asymptomatic menopausal state: Secondary | ICD-10-CM | POA: Diagnosis not present

## 2023-09-04 DIAGNOSIS — Z79899 Other long term (current) drug therapy: Secondary | ICD-10-CM | POA: Diagnosis not present

## 2023-09-04 DIAGNOSIS — I472 Ventricular tachycardia, unspecified: Secondary | ICD-10-CM | POA: Diagnosis not present

## 2023-09-04 DIAGNOSIS — Z95810 Presence of automatic (implantable) cardiac defibrillator: Secondary | ICD-10-CM | POA: Diagnosis not present

## 2023-09-04 DIAGNOSIS — G720 Drug-induced myopathy: Secondary | ICD-10-CM | POA: Diagnosis not present

## 2023-09-04 DIAGNOSIS — K2960 Other gastritis without bleeding: Secondary | ICD-10-CM | POA: Diagnosis not present

## 2023-09-04 DIAGNOSIS — Z0000 Encounter for general adult medical examination without abnormal findings: Secondary | ICD-10-CM | POA: Diagnosis not present

## 2023-09-09 ENCOUNTER — Ambulatory Visit: Attending: Cardiology

## 2023-09-09 DIAGNOSIS — Z95810 Presence of automatic (implantable) cardiac defibrillator: Secondary | ICD-10-CM

## 2023-09-09 DIAGNOSIS — I428 Other cardiomyopathies: Secondary | ICD-10-CM

## 2023-09-13 NOTE — Progress Notes (Signed)
 EPIC Encounter for ICM Monitoring  Patient Name: Michelle Castro is a 73 y.o. female Date: 09/13/2023 Primary Care Physican: Street, Lonni HERO, MD Primary Cardiologist:  [facility] Electrophysiologist: Inocencio 04/26/2021 Office Weight: 210 lbs 12/26/2022 Weight: 201 lbs 04/09/2023 Weight: 201-202 lbs  05/16/2023 Weight: 197 lbs 08/16/2023 Office Weight: 200 bs   Time in AT/AF  0.0 hr/day (0.0%)                                                     Transmission results reviewed.    Diet:  No changes in diet and is not strict with fluid or salt intake. She has decreased fluid intake recently since 7/5.   Optivol thoracic impedance suggesting intermittent days with possible fluid accumulation.   Prescribed:  Spironolactone  25 mg take 0.5 tablet (12.5 mg total) by mouth daily.   Recommendations:    No changes.    Defib Office Check scheduled 8/11.   Follow-up plan: ICM clinic phone appointment on 10/14/2023.   91 day device clinic remote transmission 09/18/2023.     EP/Cardiology Office Visits:  09/16/2023 with Dr Inocencio.     Copy of ICM check sent to Dr. Inocencio.    3 month ICM trend: 09/09/2023.    12-14 Month ICM trend:     [name], RN 09/13/2023 8:18 AM

## 2023-09-15 NOTE — Progress Notes (Unsigned)
  Electrophysiology Office Note:   Date:  09/15/2023  ID:  Michelle Castro, DOB March 15, 1950, MRN [id number]  Primary Cardiologist: None Primary Heart Failure: None Electrophysiologist: Michelle Appelhans [name], MD  {Click to update primary MD,subspecialty MD or APP then REFRESH:1}    History of Present Illness:   Michelle Castro is a 73 y.o. female with h/o chronic systolic heart failure, ventricular tachycardia, long QT seen today for post hospital follow up.    Admitted ***  Since discharge from hospital the patient reports doing ***.  she denies chest pain, palpitations, dyspnea, PND, orthopnea, nausea, vomiting, dizziness, syncope, edema, weight gain, or early satiety.   Review of systems complete and found to be negative unless listed in HPI.      EP Information / Studies Reviewed:    {EKGtoday:28818}      ICD Interrogation-  reviewed in detail today,  See PACEART report.  Device History: Medtronic Dual Chamber ICD implanted 03/05/2012 for ventricular tachycardia History of appropriate therapy: Yes History of AAD therapy: No   Risk Assessment/Calculations:            Physical Exam:   VS:  There were no vitals taken for this visit.   Wt Readings from Last 3 Encounters:  08/16/23 200 lb (90.7 kg)  11/05/22 212 lb (96.2 kg)  12/04/21 215 lb (97.5 kg)     GEN: Well nourished, well developed in no acute distress NECK: No JVD; No carotid bruits CARDIAC: {EPRHYTHM:28826}, no murmurs, rubs, gallops RESPIRATORY:  Clear to auscultation without rales, wheezing or rhonchi  ABDOMEN: Soft, non-tender, non-distended EXTREMITIES:  No edema; No deformity   ASSESSMENT AND PLAN:    Ventricular tachycardia due to long QT syndrome s/p Medtronic dual chamber ICD  euvolemic today Stable on an appropriate medical regimen Normal ICD function See Pace Art report No changes today ***  2.  Hypertension:***  3.  Chronic systolic heart failure: Due to nonischemic cardiomyopathy.  On medical.   Has heart block.***  Disposition:   Follow up with {EPPROVIDERS:28135::EP Team} {EPFOLLOW LE:71826}   Signed, Armstead Heiland [name], MD

## 2023-09-16 ENCOUNTER — Ambulatory Visit: Payer: Medicare (Managed Care) | Attending: Cardiology | Admitting: Cardiology

## 2023-09-16 ENCOUNTER — Encounter: Payer: Self-pay | Admitting: Cardiology

## 2023-09-16 VITALS — BP 118/72 | HR 71 | Ht 66.0 in | Wt 203.0 lb

## 2023-09-16 DIAGNOSIS — I5022 Chronic systolic (congestive) heart failure: Secondary | ICD-10-CM

## 2023-09-16 DIAGNOSIS — I472 Ventricular tachycardia, unspecified: Secondary | ICD-10-CM | POA: Diagnosis not present

## 2023-09-16 DIAGNOSIS — I10 Essential (primary) hypertension: Secondary | ICD-10-CM | POA: Diagnosis not present

## 2023-09-16 DIAGNOSIS — I4581 Long QT syndrome: Secondary | ICD-10-CM

## 2023-09-16 LAB — CUP PACEART INCLINIC DEVICE CHECK
Battery Remaining Longevity: 60 mo
Battery Voltage: 2.94 V
Brady Statistic AP VP Percent: 75.21 %
Brady Statistic AP VS Percent: 0.29 %
Brady Statistic AS VP Percent: 24.19 %
Brady Statistic AS VS Percent: 0.3 %
Brady Statistic RA Percent Paced: 74.55 %
Brady Statistic RV Percent Paced: 98.5 %
Date Time Interrogation Session: 20250811115541
HighPow Impedance: 76 Ohm
Implantable Lead Connection Status: 753985
Implantable Lead Connection Status: 753985
Implantable Lead Implant Date: 20140129
Implantable Lead Implant Date: 20140129
Implantable Lead Location: 753859
Implantable Lead Location: 753860
Implantable Lead Model: 180
Implantable Lead Model: 5076
Implantable Lead Serial Number: 310373
Implantable Pulse Generator Implant Date: 20210818
Lead Channel Impedance Value: 399 Ohm
Lead Channel Impedance Value: 437 Ohm
Lead Channel Impedance Value: 437 Ohm
Lead Channel Pacing Threshold Amplitude: 0.5 V
Lead Channel Pacing Threshold Amplitude: 0.75 V
Lead Channel Pacing Threshold Pulse Width: 0.4 ms
Lead Channel Pacing Threshold Pulse Width: 0.4 ms
Lead Channel Sensing Intrinsic Amplitude: 1.375 mV
Lead Channel Sensing Intrinsic Amplitude: 1.375 mV
Lead Channel Sensing Intrinsic Amplitude: 1.875 mV
Lead Channel Sensing Intrinsic Amplitude: 16.625 mV
Lead Channel Setting Pacing Amplitude: 2 V
Lead Channel Setting Pacing Amplitude: 2.5 V
Lead Channel Setting Pacing Pulse Width: 0.4 ms
Lead Channel Setting Sensing Sensitivity: 0.3 mV
Zone Setting Status: 755011
Zone Setting Status: 755011

## 2023-09-16 NOTE — Patient Instructions (Signed)
 Medication Instructions:  Your physician recommends that you continue on your current medications as directed. Please refer to the Current Medication list given to you today.  *If you need a refill on your cardiac medications before your next appointment, please call your pharmacy*  Lab Work: Will need labs within 30 days of procedure  You may go to any [facility] Location for your lab work:  [facility] - [address] ([facility]) - [address] [phone] [address]  [place] - [address]   [place]  - [address]   [place] - [address] - [address] [employer]  - [address] [place] [address] ([facility]   If you have labs (blood work) drawn today and your tests are completely normal, you will receive your results only by: [employer] (if you have MyChart)  If you have any lab test that is abnormal or we need to change your treatment, we will call you or send a MyChart message to review the results.  Testing/Procedures: Will call to schedule BiV ICD upgrade  Follow-Up: At [facility], you and your health needs are our priority.  As part of our continuing mission to provide you with exceptional heart care, we have created designated Provider Care Teams.  These Care Teams include your primary Cardiologist (physician) and Advanced Practice Providers (APPs -  Physician Assistants and Nurse Practitioners) who all work together to provide you with the care you need, when you need it.   Your next appointment:   To be scheduled  The format for your next appointment:   In Person  Provider:   Soyla Norton, MD or one of the following Advanced Practice Providers on your designated Care Team:   Charlies Arthur, [place] Ozell Jodie Passey, [place] Leotis Barrack, NP  Note: Remote monitoring is used to monitor your Pacemaker/ ICD from home. This monitoring  reduces the number of office visits required to check your device to one time per year. It allows us  to keep an eye on the functioning of your device to ensure it is working properly.

## 2023-09-17 ENCOUNTER — Encounter: Payer: Self-pay | Admitting: Internal Medicine

## 2023-09-18 ENCOUNTER — Ambulatory Visit (INDEPENDENT_AMBULATORY_CARE_PROVIDER_SITE_OTHER): Payer: Medicare (Managed Care)

## 2023-09-18 ENCOUNTER — Other Ambulatory Visit: Payer: Self-pay

## 2023-09-18 DIAGNOSIS — I5022 Chronic systolic (congestive) heart failure: Secondary | ICD-10-CM

## 2023-09-18 DIAGNOSIS — I428 Other cardiomyopathies: Secondary | ICD-10-CM

## 2023-09-18 DIAGNOSIS — I472 Ventricular tachycardia, unspecified: Secondary | ICD-10-CM

## 2023-09-19 LAB — CUP PACEART REMOTE DEVICE CHECK
Battery Remaining Longevity: 61 mo
Battery Voltage: 2.96 V
Brady Statistic AP VP Percent: 76.23 %
Brady Statistic AP VS Percent: 0.27 %
Brady Statistic AS VP Percent: 22.53 %
Brady Statistic AS VS Percent: 0.97 %
Brady Statistic RA Percent Paced: 75.13 %
Brady Statistic RV Percent Paced: 97.62 %
Date Time Interrogation Session: 20250813043824
HighPow Impedance: 69 Ohm
Implantable Lead Connection Status: 753985
Implantable Lead Connection Status: 753985
Implantable Lead Implant Date: 20140129
Implantable Lead Implant Date: 20140129
Implantable Lead Location: 753859
Implantable Lead Location: 753860
Implantable Lead Model: 180
Implantable Lead Model: 5076
Implantable Lead Serial Number: 310373
Implantable Pulse Generator Implant Date: 20210818
Lead Channel Impedance Value: 380 Ohm
Lead Channel Impedance Value: 494 Ohm
Lead Channel Impedance Value: 532 Ohm
Lead Channel Pacing Threshold Amplitude: 0.5 V
Lead Channel Pacing Threshold Amplitude: 0.75 V
Lead Channel Pacing Threshold Pulse Width: 0.4 ms
Lead Channel Pacing Threshold Pulse Width: 0.4 ms
Lead Channel Sensing Intrinsic Amplitude: 1.375 mV
Lead Channel Sensing Intrinsic Amplitude: 1.375 mV
Lead Channel Sensing Intrinsic Amplitude: 1.875 mV
Lead Channel Sensing Intrinsic Amplitude: 16.625 mV
Lead Channel Setting Pacing Amplitude: 2 V
Lead Channel Setting Pacing Amplitude: 2.5 V
Lead Channel Setting Pacing Pulse Width: 0.4 ms
Lead Channel Setting Sensing Sensitivity: 0.3 mV
Zone Setting Status: 755011
Zone Setting Status: 755011

## 2023-09-21 ENCOUNTER — Ambulatory Visit: Payer: Self-pay | Admitting: Cardiology

## 2023-10-10 ENCOUNTER — Telehealth (HOSPITAL_COMMUNITY): Payer: Self-pay

## 2023-10-10 NOTE — Telephone Encounter (Signed)
 Attempted to reach patient to discuss upcoming procedure, no answer. Left VM for patient to return call.

## 2023-10-14 ENCOUNTER — Telehealth: Payer: Self-pay | Admitting: *Deleted

## 2023-10-14 ENCOUNTER — Ambulatory Visit: Payer: Medicare (Managed Care) | Attending: Cardiology

## 2023-10-14 DIAGNOSIS — I5022 Chronic systolic (congestive) heart failure: Secondary | ICD-10-CM

## 2023-10-14 DIAGNOSIS — Z95810 Presence of automatic (implantable) cardiac defibrillator: Secondary | ICD-10-CM | POA: Diagnosis not present

## 2023-10-14 NOTE — Telephone Encounter (Signed)
 Request from Michelle Castro with HF team to review recent transmission concerned about VT episodes  7 episodes of VT listed with 2 episodes c/w slow VT d/t rates falling below the VT detection rate but meeting other identifying criteria for VT  Longest episode #72 c/w NSVT that lasted 1 minute with 54 beats and had an average V-rate of 182  Called patient to assess for symptoms  Patient confirmed being symptomatic during episodes in question   Patient reported heaviness in her chest, but denied SOB or pain radiating to neck, left arm or back  She also denied vision changes or loss of consciousness during said events  Patient stated that she layed down and her symptoms subsided after roughly 1 minute  Patient stated that she is compliant with all of her prescribed medications   ED precautions reviewed with patient and they verbalized understanding   All questions and concerns addressed at this time and patient appreciative of phone call  Routing to MD for review and awareness

## 2023-10-15 NOTE — Telephone Encounter (Signed)
 Patient called advise Dr. Inocencio would like for patient to be seen in office and someone from scheduling will contact her for apt. Pt voiced understanding and appreciative of call back.

## 2023-10-15 NOTE — Telephone Encounter (Signed)
 Spoke w/ patient - she is scheduled on 9/11 w/ [place], PA.

## 2023-10-16 NOTE — Progress Notes (Signed)
 EPIC Encounter for ICM Monitoring  Patient Name: Michelle Castro is a 73 y.o. female Date: 10/16/2023 Primary Care Physican: Street, Lonni HERO, MD Primary Cardiologist:  [facility] Electrophysiologist: Inocencio 04/26/2021 Office Weight: 210 lbs 12/26/2022 Weight: 201 lbs 04/09/2023 Weight: 201-202 lbs  05/16/2023 Weight: 197 lbs 08/16/2023 Office Weight: 200 bs   Time in AT/AF  0.0 hr/day (0.0%)                                                    Spoke with patient and heart failure questions reviewed.  Transmission results reviewed.  Pt asymptomatic for fluid accumulation.  She did feel a little chest fullness during decreased impedance but has resolved.  She has appt with Jodie Passey, PA regarding recent VT episodes.   Diet:  No changes in diet and is not strict with fluid or salt intake. She has decreased fluid intake recently since 7/5.   Optivol thoracic impedance suggesting intermittent days with possible fluid accumulation since 8/18 but at baseline 9/8.   Prescribed:  Spironolactone  25 mg take 0.5 tablet (12.5 mg total) by mouth daily.   Recommendations:  No changes and encouraged to call if experiencing any fluid symptoms.  She has biv upgrade scheduled for 11/07/2023.   Follow-up plan: ICM clinic phone appointment on 11/25/2023.   91 day device clinic remote transmission 12/18/2023.     EP/Cardiology Office Visits:  10/17/2023 with Jodie Passey, PA.     Copy of ICM check sent to Dr. Inocencio.    3 month ICM trend: 10/14/2023.    12-14 Month ICM trend:     [name], RN 10/16/2023 2:56 PM

## 2023-10-16 NOTE — Progress Notes (Unsigned)
  Electrophysiology Office Note:   ID:  Michelle Castro, DOB August 01, 1950, MRN [id number]  Primary Cardiologist: None Electrophysiologist: Will [name], MD  {Click to update primary MD,subspecialty MD or APP then REFRESH:1}    History of Present Illness:   Michelle Castro is a 73 y.o. female with h/o chronic systolic heart failure, ventricular tachycardia, and long QT seen today for acute visit due to ***.    Recently admitted to [facility] with ventricular tachycardia.  She has a history of prolonged QT syndrome.  Her base pacing rate was increased to 80 bpm.  She has had minimal ventricular arrhythmias since then.  She has been diagnosed with heart failure with an ejection fraction of 25 to 30%.    Patient reports ***.  she denies chest pain, palpitations, dyspnea, PND, orthopnea, nausea, vomiting, dizziness, syncope, edema, weight gain, or early satiety.   Review of systems complete and found to be negative unless listed in HPI.   EP Information / Studies Reviewed:    EKG is not ordered today. EKG from 08/16/2023 reviewed which showed ***       ICD Interrogation-  reviewed in detail today,  See PACEART report.  Arrhythmia/Device History ICD-Medtronic-Optivol-Carelink  07/07/2015 DPR on file authoring all [facility] may speak with daughter Michelle Castro at [phone] or son Michelle Castro at cell [phone] or home [phone]?SABRA  May leave detailed message on cell phone [phone].   Physical Exam:   VS:  There were no vitals taken for this visit.   Wt Readings from Last 3 Encounters:  09/16/23 203 lb (92.1 kg)  08/16/23 200 lb (90.7 kg)  11/05/22 212 lb (96.2 kg)     GEN: No acute distress *** NECK: No JVD; No carotid bruits CARDIAC: {EPRHYTHM:28826}, no murmurs, rubs, gallops RESPIRATORY:  Clear to auscultation without rales, wheezing or rhonchi  ABDOMEN: Soft, non-tender, non-distended EXTREMITIES:  {EDEMA LEVEL:28147::No} edema; No deformity    ASSESSMENT AND PLAN:    Ventricular arrhythmia due to long QT syndrome s/p Medtronic dual chamber ICD  euvolemic today Stable on an appropriate medical regimen Normal ICD function See Pace Art report No changes today  HTN Stable on current regimen   Chronic systolic CHF EF newly down ? If from RV pacing Planning CRT upgrade next month.     Disposition:   Follow up with {EPPROVIDERS:28135::EP Team} {EPFOLLOW UP:28173}   Signed, Michelle Prentice Passey, PA-C

## 2023-10-16 NOTE — H&P (View-Only) (Signed)
  Electrophysiology Office Note:   ID:  Michelle Castro, DOB August 01, 1950, MRN [id number]  Primary Cardiologist: None Electrophysiologist: Will [name], MD  {Click to update primary MD,subspecialty MD or APP then REFRESH:1}    History of Present Illness:   Michelle Castro is a 73 y.o. female with h/o chronic systolic heart failure, ventricular tachycardia, and long QT seen today for acute visit due to ***.    Recently admitted to [facility] with ventricular tachycardia.  She has a history of prolonged QT syndrome.  Her base pacing rate was increased to 80 bpm.  She has had minimal ventricular arrhythmias since then.  She has been diagnosed with heart failure with an ejection fraction of 25 to 30%.    Patient reports ***.  she denies chest pain, palpitations, dyspnea, PND, orthopnea, nausea, vomiting, dizziness, syncope, edema, weight gain, or early satiety.   Review of systems complete and found to be negative unless listed in HPI.   EP Information / Studies Reviewed:    EKG is not ordered today. EKG from 08/16/2023 reviewed which showed ***       ICD Interrogation-  reviewed in detail today,  See PACEART report.  Arrhythmia/Device History ICD-Medtronic-Optivol-Carelink  07/07/2015 DPR on file authoring all [facility] may speak with daughter Myrick Cartrette/[place] at [phone] or son Ozell Kemps at cell [phone] or home [phone]?SABRA  May leave detailed message on cell phone [phone].   Physical Exam:   VS:  There were no vitals taken for this visit.   Wt Readings from Last 3 Encounters:  09/16/23 203 lb (92.1 kg)  08/16/23 200 lb (90.7 kg)  11/05/22 212 lb (96.2 kg)     GEN: No acute distress *** NECK: No JVD; No carotid bruits CARDIAC: {EPRHYTHM:28826}, no murmurs, rubs, gallops RESPIRATORY:  Clear to auscultation without rales, wheezing or rhonchi  ABDOMEN: Soft, non-tender, non-distended EXTREMITIES:  {EDEMA LEVEL:28147::No} edema; No deformity    ASSESSMENT AND PLAN:    Ventricular arrhythmia due to long QT syndrome s/p Medtronic dual chamber ICD  euvolemic today Stable on an appropriate medical regimen Normal ICD function See Pace Art report No changes today  HTN Stable on current regimen   Chronic systolic CHF EF newly down ? If from RV pacing Planning CRT upgrade next month.     Disposition:   Follow up with {EPPROVIDERS:28135::EP Team} {EPFOLLOW UP:28173}   Signed, Ozell Prentice Passey, PA-C

## 2023-10-17 ENCOUNTER — Ambulatory Visit: Payer: Medicare (Managed Care) | Attending: Student | Admitting: Student

## 2023-10-17 ENCOUNTER — Ambulatory Visit: Payer: Self-pay | Admitting: Cardiology

## 2023-10-17 ENCOUNTER — Encounter: Payer: Self-pay | Admitting: Student

## 2023-10-17 VITALS — BP 114/80 | HR 89 | Ht 66.0 in | Wt 207.8 lb

## 2023-10-17 DIAGNOSIS — I5022 Chronic systolic (congestive) heart failure: Secondary | ICD-10-CM

## 2023-10-17 DIAGNOSIS — I10 Essential (primary) hypertension: Secondary | ICD-10-CM

## 2023-10-17 DIAGNOSIS — I4581 Long QT syndrome: Secondary | ICD-10-CM

## 2023-10-17 DIAGNOSIS — I428 Other cardiomyopathies: Secondary | ICD-10-CM

## 2023-10-17 DIAGNOSIS — I472 Ventricular tachycardia, unspecified: Secondary | ICD-10-CM

## 2023-10-17 LAB — CUP PACEART INCLINIC DEVICE CHECK
Battery Remaining Longevity: 57 mo
Battery Voltage: 2.97 V
Brady Statistic AP VP Percent: 77.16 %
Brady Statistic AP VS Percent: 0.33 %
Brady Statistic AS VP Percent: 22.23 %
Brady Statistic AS VS Percent: 0.28 %
Brady Statistic RA Percent Paced: 75.63 %
Brady Statistic RV Percent Paced: 97.57 %
Date Time Interrogation Session: 20250911093155
HighPow Impedance: 74 Ohm
Implantable Lead Connection Status: 753985
Implantable Lead Connection Status: 753985
Implantable Lead Implant Date: 20140129
Implantable Lead Implant Date: 20140129
Implantable Lead Location: 753859
Implantable Lead Location: 753860
Implantable Lead Model: 180
Implantable Lead Model: 5076
Implantable Lead Serial Number: 310373
Implantable Pulse Generator Implant Date: 20210818
Lead Channel Impedance Value: 456 Ohm
Lead Channel Impedance Value: 494 Ohm
Lead Channel Impedance Value: 532 Ohm
Lead Channel Pacing Threshold Amplitude: 0.5 V
Lead Channel Pacing Threshold Amplitude: 0.75 V
Lead Channel Pacing Threshold Pulse Width: 0.4 ms
Lead Channel Pacing Threshold Pulse Width: 0.4 ms
Lead Channel Sensing Intrinsic Amplitude: 1.125 mV
Lead Channel Sensing Intrinsic Amplitude: 1.125 mV
Lead Channel Sensing Intrinsic Amplitude: 16.625 mV
Lead Channel Sensing Intrinsic Amplitude: 16.625 mV
Lead Channel Setting Pacing Amplitude: 2 V
Lead Channel Setting Pacing Amplitude: 2.5 V
Lead Channel Setting Pacing Pulse Width: 0.4 ms
Lead Channel Setting Sensing Sensitivity: 0.3 mV
Zone Setting Status: 755011
Zone Setting Status: 755011

## 2023-10-17 MED ORDER — MEXILETINE HCL 150 MG PO CAPS: 150.0000 mg | ORAL_CAPSULE | Freq: Two times a day (BID) | ORAL | 1 refills | Status: AC

## 2023-10-17 NOTE — Patient Instructions (Signed)
 Medication Instructions:  Start mexiletine 150 mg twice daily *If you need a refill on your cardiac medications before your next appointment, please call your pharmacy*  Lab Work: BMET, CBC, MAG-TODAY If you have labs (blood work) drawn today and your tests are completely normal, you will receive your results only by: MyChart Message (if you have MyChart) OR A paper copy in the mail If you have any lab test that is abnormal or we need to change your treatment, we will call you to review the results.  Testing/Procedures: See letter  Follow-Up: At [facility], you and your health needs are our priority.  As part of our continuing mission to provide you with exceptional heart care, our providers are all part of one team.  This team includes your primary Cardiologist (physician) and Advanced Practice Providers or APPs (Physician Assistants and Nurse Practitioners) who all work together to provide you with the care you need, when you need it.  Your next appointment:   Will be coordinated and print out on your discharge summary after your procedure.

## 2023-10-18 LAB — BASIC METABOLIC PANEL WITH GFR
BUN/Creatinine Ratio: 18 (ref 12–28)
BUN: 28 mg/dL — ABNORMAL HIGH (ref 8–27)
CO2: 21 mmol/L (ref 20–29)
Calcium: 9.6 mg/dL (ref 8.7–10.3)
Chloride: 102 mmol/L (ref 96–106)
Creatinine, Ser: 1.56 mg/dL — ABNORMAL HIGH (ref 0.57–1.00)
Glucose: 94 mg/dL (ref 70–99)
Potassium: 4.7 mmol/L (ref 3.5–5.2)
Sodium: 139 mmol/L (ref 134–144)
eGFR: 34 mL/min/1.73 — ABNORMAL LOW (ref >59)

## 2023-10-18 LAB — CBC
Hematocrit: 39.3 % (ref 34.0–46.6)
Hemoglobin: 12.8 g/dL (ref 11.1–15.9)
MCH: 30.3 pg (ref 26.6–33.0)
MCHC: 32.6 g/dL (ref 31.5–35.7)
MCV: 93 fL (ref 79–97)
Platelets: 281 x10E3/uL (ref 150–450)
RBC: 4.22 x10E6/uL (ref 3.77–5.28)
RDW: 13 % (ref 11.7–15.4)
WBC: 7 x10E3/uL (ref 3.4–10.8)

## 2023-10-18 LAB — MAGNESIUM: Magnesium: 2.3 mg/dL (ref 1.6–2.3)

## 2023-10-31 NOTE — Progress Notes (Signed)
Remote ICD Transmission.

## 2023-11-06 NOTE — Pre-Procedure Instructions (Signed)
 Instructed patient on the following items: Arrival time 1230 Nothing to eat or drink after midnight No meds AM of procedure Responsible person to drive you home and stay with you for 24 hrs Wash with special soap night before and morning of procedure

## 2023-11-07 ENCOUNTER — Ambulatory Visit (HOSPITAL_COMMUNITY)
Admission: RE | Admit: 2023-11-07 | Discharge: 2023-11-07 | Disposition: A | Payer: Medicare (Managed Care) | Attending: Cardiology | Admitting: Cardiology

## 2023-11-07 ENCOUNTER — Ambulatory Visit (HOSPITAL_COMMUNITY): Payer: Medicare (Managed Care)

## 2023-11-07 ENCOUNTER — Telehealth: Payer: Self-pay

## 2023-11-07 ENCOUNTER — Ambulatory Visit (HOSPITAL_COMMUNITY)
Admission: RE | Disposition: A | Discharge status: Home / Self Care | Payer: Self-pay | Source: Home / Self Care | Attending: Cardiology

## 2023-11-07 ENCOUNTER — Other Ambulatory Visit: Payer: Self-pay

## 2023-11-07 DIAGNOSIS — I110 Hypertensive heart disease with heart failure: Secondary | ICD-10-CM | POA: Insufficient documentation

## 2023-11-07 DIAGNOSIS — I429 Cardiomyopathy, unspecified: Secondary | ICD-10-CM

## 2023-11-07 DIAGNOSIS — I5022 Chronic systolic (congestive) heart failure: Secondary | ICD-10-CM | POA: Insufficient documentation

## 2023-11-07 DIAGNOSIS — I428 Other cardiomyopathies: Secondary | ICD-10-CM | POA: Insufficient documentation

## 2023-11-07 DIAGNOSIS — I4581 Long QT syndrome: Secondary | ICD-10-CM | POA: Diagnosis not present

## 2023-11-07 DIAGNOSIS — I447 Left bundle-branch block, unspecified: Secondary | ICD-10-CM | POA: Insufficient documentation

## 2023-11-07 HISTORY — PX: LEAD INSERTION: EP1212

## 2023-11-07 HISTORY — PX: BIV UPGRADE: EP1202

## 2023-11-07 SURGERY — BIV UPGRADE

## 2023-11-07 MED ORDER — MIDAZOLAM HCL 2 MG/2ML IJ SOLN
INTRAMUSCULAR | Status: AC
  Filled 2023-11-07: qty 2

## 2023-11-07 MED ORDER — SODIUM CHLORIDE 0.9 % IV SOLN: INTRAVENOUS | Status: DC

## 2023-11-07 MED ORDER — LIDOCAINE HCL (PF) 1 % IJ SOLN
INTRAMUSCULAR | Status: AC
  Filled 2023-11-07: qty 30

## 2023-11-07 MED ORDER — CEFAZOLIN SODIUM-DEXTROSE 2-3 GM-%(50ML) IV SOLR: INTRAVENOUS | Status: DC | PRN

## 2023-11-07 MED ORDER — CEFAZOLIN SODIUM-DEXTROSE 2-4 GM/100ML-% IV SOLN: 2.0000 g | INTRAVENOUS | Status: AC

## 2023-11-07 MED ORDER — FENTANYL CITRATE (PF) 100 MCG/2ML IJ SOLN
INTRAMUSCULAR | Status: AC
  Filled 2023-11-07: qty 2

## 2023-11-07 MED ORDER — FENTANYL CITRATE (PF) 100 MCG/2ML IJ SOLN
INTRAMUSCULAR | Status: DC | PRN
  Administered 2023-11-07 (×2): 25 ug via INTRAVENOUS

## 2023-11-07 MED ORDER — SODIUM CHLORIDE 0.9 % IV SOLN: 80.0000 mg | INTRAVENOUS | Status: DC

## 2023-11-07 MED ORDER — IOHEXOL 350 MG/ML SOLN
INTRAVENOUS | Status: DC | PRN
  Administered 2023-11-07: 20 mL via INTRAVENOUS

## 2023-11-07 MED ORDER — MIDAZOLAM HCL 5 MG/5ML IJ SOLN
INTRAMUSCULAR | Status: DC | PRN
  Administered 2023-11-07: 2 mg via INTRAVENOUS
  Administered 2023-11-07: 1 mg via INTRAVENOUS

## 2023-11-07 MED ORDER — CEFAZOLIN SODIUM-DEXTROSE 2-4 GM/100ML-% IV SOLN
INTRAVENOUS | Status: AC
  Administered 2023-11-07: 2 g via INTRAVENOUS
  Filled 2023-11-07: qty 100

## 2023-11-07 MED ORDER — CHLORHEXIDINE GLUCONATE 4 % EX SOLN
4.0000 | Freq: Once | CUTANEOUS | Status: DC
  Filled 2023-11-07: qty 60

## 2023-11-07 MED ORDER — POVIDONE-IODINE 10 % EX SWAB: 2.0000 | Freq: Once | CUTANEOUS | Status: DC

## 2023-11-07 MED ORDER — ACETAMINOPHEN 325 MG PO TABS: 325.0000 mg | ORAL_TABLET | ORAL | Status: DC | PRN

## 2023-11-07 MED ORDER — SODIUM CHLORIDE 0.9 % IV SOLN
INTRAVENOUS | Status: AC
  Filled 2023-11-07: qty 2

## 2023-11-07 MED ORDER — ONDANSETRON HCL 4 MG/2ML IJ SOLN: 4.0000 mg | Freq: Four times a day (QID) | INTRAMUSCULAR | Status: DC | PRN

## 2023-11-07 MED ORDER — SODIUM CHLORIDE 0.9 % IV SOLN
INTRAVENOUS | Status: DC | PRN
  Administered 2023-11-07: 80 mg

## 2023-11-07 SURGICAL SUPPLY — 13 items
BALLOON ATTAIN 80 (BALLOONS) IMPLANT
CABLE SURGICAL S-101-97-12 (CABLE) ×1 IMPLANT
CATH ATTAIN COM SURV 6250V-MB2 (CATHETERS) IMPLANT
ICD COBALT XT QUAD CRT DTPA2Q1 (ICD Generator) IMPLANT
LEAD ATTAIN PERFORMA S 4598-88 (Lead) IMPLANT
PAD DEFIB RADIO PHYSIO CONN (PAD) ×1 IMPLANT
POUCH AIGIS-R ANTIBACT ICD LRG (Mesh General) IMPLANT
SHEATH 9.5FR PRELUDE SNAP 13 (SHEATH) IMPLANT
SHEATH PROBE COVER 6X72 (BAG) IMPLANT
SLITTER 6232ADJ (MISCELLANEOUS) IMPLANT
TRAY PACEMAKER INSERTION (PACKS) ×1 IMPLANT
WIRE ACUITY WHISPER EDS 4648 (WIRE) IMPLANT
WIRE HI TORQ VERSACORE-J 145CM (WIRE) IMPLANT

## 2023-11-07 NOTE — Telephone Encounter (Signed)
 Called pt to make her aware that there was a cancellation ahead of her today in the cath lab and her procedure will be earlier than expected. She will go ahead and get ready and should be at [facility] by 10:30 am.

## 2023-11-07 NOTE — Progress Notes (Signed)
 While pt was getting dressed to leave there was noted to be a hematoma under her bandage. Pressure was held for 10 minutes, when released hematoma was still present. Pressure was reapplied for 15 minutes, when released hematoma appeared to have resolved. Sight was boggie, no s/s of complications at the incision site. MD made aware verbal orders to monitor the patient for an additional hour were given. After the hour the incision site, did not appear to have complications. PT was escorted from the unit via wheelchair to personal vehicle driven by daughter.

## 2023-11-07 NOTE — Interval H&P Note (Signed)
 History and Physical Interval Note:  11/07/2023 12:04 PM  Michelle Castro  has presented today for surgery, with the diagnosis of hf.  The various methods of treatment have been discussed with the patient and family. After consideration of risks, benefits and other options for treatment, the patient has consented to  Procedure(s): BIV UPGRADE (N/A) LEAD INSERTION (N/A) as a surgical intervention.  The patient's history has been reviewed, patient examined, no change in status, stable for surgery.  I have reviewed the patient's chart and labs.  Questions were answered to the patient's satisfaction.     Benjimin Hadden [employer]

## 2023-11-07 NOTE — Progress Notes (Signed)
 Discharge instructions reviewed with patient and daughter Michelle Castro [facility]) at bedside. Denies questions or concerns. PT was able to tolerate lquids. PT ambulated to the bathroom, was able to void.

## 2023-11-07 NOTE — Discharge Instructions (Addendum)
 After Your Pacemaker   You have a Medtronic Pacemaker  If you have a Medtronic or Biotronik device, plug in your home monitor once you get home, and no manual interaction is required.   If you have an [employer] or [employer] device, plug your home monitor once you get home, sit near the device, and press the large activation button. Sit nearby until the process is complete, usually notated by lights on the monitor.   If you were set up for monitoring using an app on your phone, make sure the app remains open in the background and the Bluetooth remains on.  ACTIVITY Do not lift your arm above shoulder height for 1 week after your procedure. After 7 days, you may progress as below.  You should remove your sling 24 hours after your procedure, unless otherwise instructed by your provider.     Thursday November 14, 2023  Friday November 15, 2023 Saturday November 16, 2023 Sunday November 17, 2023   Do not lift, push, pull, or carry anything over 10 pounds with the affected arm until 6 weeks (Thursday December 19, 2023 ) after your procedure.   You may drive AFTER your wound check, unless you have been told otherwise by your provider.   Ask your healthcare provider when you can go back to work   INCISION/Dressing If you are on a blood thinner such as Coumadin , Xarelto, Eliquis , Plavix, or Pradaxa please confirm with your provider when this should be resumed. 11/14/2023  If large square, outer bandage is left in place, this can be removed after 24 hours from your procedure. Do not remove steri-strips or glue as below.   If a PRESSURE DRESSING (a bulky dressing that usually goes up over your shoulder) was applied or left in place, please follow instructions given by your provider on when to return to have this removed.   Monitor your Pacemaker site for redness, swelling, and drainage. Call the device clinic at [phone] if you experience these symptoms or fever/chills.  If your incision  is sealed with Steri-strips or staples, you may shower 7 days after your procedure or when told by your provider. Do not remove the steri-strips or let the shower hit directly on your site. You may wash around your site with soap and water.    If you were discharged in a sling, please do not wear this during the day more than 48 hours after your surgery unless otherwise instructed. This may increase the risk of stiffness and soreness in your shoulder.   Avoid lotions, ointments, or perfumes over your incision until it is well-healed.  You may use a hot tub or a pool AFTER your wound check appointment if the incision is completely closed.  Pacemaker Alerts:  Some alerts are vibratory and others beep. These are NOT emergencies. Please call our office to let us  know. If this occurs at night or on weekends, it can wait until the next business day. Send a remote transmission.  If your device is capable of reading fluid status (for heart failure), you will be offered monthly monitoring to review this with you.   DEVICE MANAGEMENT Remote monitoring is used to monitor your pacemaker from home. This monitoring is scheduled every 91 days by our office. It allows us  to keep an eye on the functioning of your device to ensure it is working properly. You will routinely see your Electrophysiologist annually (more often if necessary).  This will appear as a REMOTE check on your  MyChart schedule. These are automatic and there is nothing for you to manually do unless otherwise instructed.  You should receive your ID card for your new device in 4-8 weeks. Keep this card with you at all times once received. Consider wearing a medical alert bracelet or necklace.  Your Pacemaker may be MRI compatible. This will be discussed at your next office visit/wound check.  You should avoid contact with strong electric or magnetic fields.   Do not use amateur (ham) radio equipment or electric (arc) welding torches. MP3 player  headphones with magnets should not be used. Some devices are safe to use if held at least 12 inches (30 cm) from your Pacemaker. These include power tools, lawn mowers, and speakers. If you are unsure if something is safe to use, ask your health care provider.  When using your cell phone, hold it to the ear that is on the opposite side from the Pacemaker. Do not leave your cell phone in a pocket over the Pacemaker.  You may safely use electric blankets, heating pads, computers, and microwave ovens.  Call the office right away if: You have chest pain. You feel more short of breath than you have felt before. You feel more light-headed than you have felt before. Your incision starts to open up.  This information is not intended to replace advice given to you by your health care provider. Make sure you discuss any questions you have with your health care provider.

## 2023-11-08 ENCOUNTER — Encounter (HOSPITAL_COMMUNITY): Payer: Self-pay | Admitting: Cardiology

## 2023-11-08 MED FILL — Midazolam HCl Inj 2 MG/2ML (Base Equivalent): INTRAMUSCULAR | Qty: 3 | Status: AC

## 2023-11-08 MED FILL — Lidocaine HCl Local Preservative Free (PF) Inj 1%: INTRAMUSCULAR | Qty: 60 | Status: AC

## 2023-11-19 ENCOUNTER — Ambulatory Visit: Payer: Medicare (Managed Care) | Attending: Cardiology

## 2023-11-19 DIAGNOSIS — I4729 Other ventricular tachycardia: Secondary | ICD-10-CM | POA: Diagnosis not present

## 2023-11-19 NOTE — Patient Instructions (Signed)

## 2023-11-19 NOTE — Progress Notes (Signed)
 Normal CRT-D chamber ICD wound check. Wound well healed. Presenting rhythm: AP/BVP. Routine testing performed. Thresholds, sensing, and impedance consistent with implant measurements with 3.5V safety margin/auto capture until 3 month visit. No treated arrhythmias. Reviewed arm restrictions to continue for 6 weeks total post op. Reviewed shock plan.  Pt enrolled in remote follow-up.

## 2023-11-25 NOTE — Progress Notes (Signed)
 ICM transmission rescheduled for 12/24/2023 to allow Optivol thoracic impedance to develop following 11/07/2023 device battery change.

## 2023-12-18 ENCOUNTER — Ambulatory Visit

## 2023-12-18 ENCOUNTER — Ambulatory Visit: Payer: Medicare HMO

## 2023-12-18 DIAGNOSIS — I472 Ventricular tachycardia, unspecified: Secondary | ICD-10-CM

## 2023-12-19 ENCOUNTER — Telehealth: Payer: Self-pay | Admitting: *Deleted

## 2023-12-19 LAB — CUP PACEART REMOTE DEVICE CHECK
Battery Remaining Longevity: 105 mo
Battery Remaining Longevity: 105 mo
Battery Voltage: 3.1 V
Battery Voltage: 3.1 V
Brady Statistic RV Percent Paced: 98.37 %
Brady Statistic RV Percent Paced: 98.37 %
Date Time Interrogation Session: 20251112054819
Date Time Interrogation Session: 20251112054819
HighPow Impedance: 66 Ohm
HighPow Impedance: 66 Ohm
Implantable Lead Connection Status: 753985
Implantable Lead Connection Status: 753985
Implantable Lead Connection Status: 753985
Implantable Lead Connection Status: 753985
Implantable Lead Connection Status: 753985
Implantable Lead Connection Status: 753985
Implantable Lead Implant Date: 20140129
Implantable Lead Implant Date: 20140129
Implantable Lead Implant Date: 20140129
Implantable Lead Implant Date: 20140129
Implantable Lead Implant Date: 20251002
Implantable Lead Implant Date: 20251002
Implantable Lead Location: 753858
Implantable Lead Location: 753858
Implantable Lead Location: 753859
Implantable Lead Location: 753859
Implantable Lead Location: 753860
Implantable Lead Location: 753860
Implantable Lead Model: 180
Implantable Lead Model: 180
Implantable Lead Model: 4598
Implantable Lead Model: 4598
Implantable Lead Model: 5076
Implantable Lead Model: 5076
Implantable Lead Serial Number: 310373
Implantable Lead Serial Number: 310373
Implantable Pulse Generator Implant Date: 20251002
Implantable Pulse Generator Implant Date: 20251002
Lead Channel Impedance Value: 1045 Ohm
Lead Channel Impedance Value: 1045 Ohm
Lead Channel Impedance Value: 304 Ohm
Lead Channel Impedance Value: 304 Ohm
Lead Channel Impedance Value: 380 Ohm
Lead Channel Impedance Value: 380 Ohm
Lead Channel Impedance Value: 399 Ohm
Lead Channel Impedance Value: 399 Ohm
Lead Channel Impedance Value: 551 Ohm
Lead Channel Impedance Value: 551 Ohm
Lead Channel Impedance Value: 551 Ohm
Lead Channel Impedance Value: 551 Ohm
Lead Channel Impedance Value: 570 Ohm
Lead Channel Impedance Value: 570 Ohm
Lead Channel Impedance Value: 570 Ohm
Lead Channel Impedance Value: 570 Ohm
Lead Channel Impedance Value: 627 Ohm
Lead Channel Impedance Value: 627 Ohm
Lead Channel Impedance Value: 627 Ohm
Lead Channel Impedance Value: 627 Ohm
Lead Channel Impedance Value: 779 Ohm
Lead Channel Impedance Value: 779 Ohm
Lead Channel Impedance Value: 798 Ohm
Lead Channel Impedance Value: 798 Ohm
Lead Channel Impedance Value: 855 Ohm
Lead Channel Impedance Value: 855 Ohm
Lead Channel Pacing Threshold Amplitude: 0.5 V
Lead Channel Pacing Threshold Amplitude: 0.5 V
Lead Channel Pacing Threshold Amplitude: 0.75 V
Lead Channel Pacing Threshold Amplitude: 0.75 V
Lead Channel Pacing Threshold Pulse Width: 0.4 ms
Lead Channel Pacing Threshold Pulse Width: 0.4 ms
Lead Channel Pacing Threshold Pulse Width: 0.4 ms
Lead Channel Pacing Threshold Pulse Width: 0.4 ms
Lead Channel Sensing Intrinsic Amplitude: 0.9 mV
Lead Channel Sensing Intrinsic Amplitude: 0.9 mV
Lead Channel Sensing Intrinsic Amplitude: 6.4 mV
Lead Channel Sensing Intrinsic Amplitude: 6.4 mV
Lead Channel Setting Pacing Amplitude: 1 V
Lead Channel Setting Pacing Amplitude: 1 V
Lead Channel Setting Pacing Amplitude: 2 V
Lead Channel Setting Pacing Amplitude: 2 V
Lead Channel Setting Pacing Amplitude: 2 V
Lead Channel Setting Pacing Amplitude: 2 V
Lead Channel Setting Pacing Pulse Width: 0.4 ms
Lead Channel Setting Pacing Pulse Width: 0.4 ms
Lead Channel Setting Pacing Pulse Width: 0.4 ms
Lead Channel Setting Pacing Pulse Width: 0.4 ms
Lead Channel Setting Sensing Sensitivity: 0.3 mV
Lead Channel Setting Sensing Sensitivity: 0.3 mV
Zone Setting Status: 755011
Zone Setting Status: 755011
Zone Setting Status: 755011
Zone Setting Status: 755011
Zone Setting Status: 755011
Zone Setting Status: 755011

## 2023-12-19 NOTE — Telephone Encounter (Signed)
Pt called back returning call.

## 2023-12-19 NOTE — Telephone Encounter (Signed)
 Called patient back   Patient confirmed feeling symptomatic during event on 10/18, but said it was no different than any of the others and resolved rather quickly after she sat down, or laid down on her bed  Patient stated she is compliant with all of her prescribed medications   Will continue to monitor  Patient appreciative of the call

## 2023-12-19 NOTE — Telephone Encounter (Signed)
 ICD: Scheduled remote reviewed. Normal device function.  Presenting rhythm: AP/BiVP 12 NSVT since last in-clinic, V>As, episode #11 11/23/2023 0936, > 20 beats, sent to triage.  V-rates 161-212 bpm Next remote transmission per protocol.  ML, CVRS  ________________________________________________________________________________  Called patient to assess for any possible symptoms during episode on 11/23/23  Patient started on Mexilitine at LOV on 10/17/23  CRT-D BiV upgrade on 11/07/23  No answer. LMTCB

## 2023-12-20 ENCOUNTER — Ambulatory Visit: Attending: Cardiology

## 2023-12-20 DIAGNOSIS — Z95810 Presence of automatic (implantable) cardiac defibrillator: Secondary | ICD-10-CM | POA: Diagnosis not present

## 2023-12-20 DIAGNOSIS — I5022 Chronic systolic (congestive) heart failure: Secondary | ICD-10-CM | POA: Diagnosis not present

## 2023-12-20 LAB — CUP PACEART INCLINIC DEVICE CHECK
Date Time Interrogation Session: 20251014170356
Implantable Lead Connection Status: 753985
Implantable Lead Connection Status: 753985
Implantable Lead Connection Status: 753985
Implantable Lead Implant Date: 20140129
Implantable Lead Implant Date: 20140129
Implantable Lead Implant Date: 20251002
Implantable Lead Location: 753858
Implantable Lead Location: 753859
Implantable Lead Location: 753860
Implantable Lead Model: 180
Implantable Lead Model: 4598
Implantable Lead Model: 5076
Implantable Lead Serial Number: 310373
Implantable Pulse Generator Implant Date: 20251002

## 2023-12-20 NOTE — Progress Notes (Signed)
 EPIC Encounter for ICM Monitoring  Patient Name: Michelle Castro is a 73 y.o. female Date: 12/20/2023 Primary Care Physican: Street, Lonni HERO, MD Primary Cardiologist:  [facility] Electrophysiologist: Camnitz BiV Pacing: 99.0% 04/26/2021 Office Weight: 210 lbs 12/26/2022 Weight: 201 lbs 04/09/2023 Weight: 201-202 lbs  05/16/2023 Weight: 197 lbs 08/16/2023 Office Weight: 200 bs 10/17/2023 Office Visit: 207 lbs   Time in AT/AF  0.0 hr/day (0.0%)                                                    Spoke with patient and heart failure questions reviewed.  Transmission results reviewed.  Pt asymptomatic for fluid accumulation.  Reports feeling well at this time and voices no complaints.     Diet:  No changes in diet and is not strict with fluid or salt intake.    Since 11/07/2023 Battery Replacement: Optivol thoracic impedance suggesting normal fluid levels.   Prescribed:  Spironolactone  25 mg take 0.5 tablet (12.5 mg total) by mouth daily.   Recommendations:  No changes and encouraged to call if experiencing any fluid symptoms.   Follow-up plan: ICM clinic phone appointment on 01/20/2024.   91 day device clinic remote transmission 03/18/2024.     EP/Cardiology Office Visits:  02/07/2024 with Daphne Barrack, NP.SABRA     Copy of ICM check sent to Dr. Inocencio.    Remote monitoring is medically necessary for Heart Failure Management.    Daily Thoracic Impedance ICM trend: 11/07/2023 through 12/20/2023.    12-14 Month Thoracic Impedance ICM trend:     Michelle GORMAN Garner, RN 12/20/2023 7:12 AM

## 2023-12-23 NOTE — Progress Notes (Signed)
 Remote ICD Transmission

## 2023-12-24 ENCOUNTER — Ambulatory Visit: Payer: Medicare (Managed Care)

## 2023-12-24 ENCOUNTER — Ambulatory Visit: Payer: Self-pay | Admitting: Cardiology

## 2024-01-20 ENCOUNTER — Telehealth: Payer: Self-pay | Admitting: Cardiology

## 2024-01-20 ENCOUNTER — Ambulatory Visit: Attending: Cardiology

## 2024-01-20 NOTE — Telephone Encounter (Signed)
*  STAT* If patient is at the pharmacy, call can be transferred to refill team.     1. Which medications need to be refilled? (please list name of each medication and dose if known) propranolol  ER (INDERAL  LA) 120 MG 24 hr capsule      2. Would you like to learn more about the convenience, safety, & potential cost savings by using the [facility]? NO      3. Are you open to using the [facility] (Type [facility]. )     4. Which pharmacy/location (including street and city if local pharmacy) is medication to be sent to? [id number] - [place], [place] - [address]       5. Do they need a 30 day or 90 day supply? 30  PT is out of Medication

## 2024-01-21 MED ORDER — PROPRANOLOL HCL ER 120 MG PO CP24: ORAL_CAPSULE | ORAL | 2 refills | Status: AC

## 2024-01-21 NOTE — Telephone Encounter (Signed)
 Refill sent

## 2024-01-22 NOTE — Progress Notes (Signed)
 EPIC Encounter for ICM Monitoring  Patient Name: Michelle Castro is a 73 y.o. female Date: 01/22/2024 Primary Care Physican: Street, Lonni HERO, MD Primary Cardiologist:  [facility] Electrophysiologist: Camnitz BiV Pacing: 98.3% 04/26/2021 Office Weight: 210 lbs 12/26/2022 Weight: 201 lbs 04/09/2023 Weight: 201-202 lbs  05/16/2023 Weight: 197 lbs 08/16/2023 Office Weight: 200 bs 10/17/2023 Office Visit: 207 lbs 01/20/2024 Weight: 200 lbs   Time in AT/AF  0.0 hr/day (0.0%)                                                    Spoke with patient and heart failure questions reviewed.  Transmission results reviewed.  Pt asymptomatic for fluid accumulation.  Reports feeling well at this time and voices no complaints.     Diet:  No changes in diet and is not strict with fluid or salt intake.    Since 12/20/2023 ICM Remote Transmission: Optivol thoracic impedance suggesting normal fluid levels.   Prescribed:  Spironolactone  25 mg take 0.5 tablet (12.5 mg total) by mouth daily.   Recommendations:  No changes and encouraged to call if experiencing any fluid symptoms.   Follow-up plan: ICM clinic phone appointment on 02/24/2024.   91 day device clinic remote transmission 03/18/2024.     EP/Cardiology Office Visits:  02/07/2024 with Daphne Barrack, NP.     Copy of ICM check sent to Dr. Inocencio.      Remote monitoring is medically necessary for Heart Failure Management.    Daily Thoracic Impedance ICM trend: 11/07/2023 through 01/20/2024.    12-14 Month Thoracic Impedance ICM trend:     [name], RN 01/22/2024 1:34 PM

## 2024-02-07 ENCOUNTER — Encounter: Payer: Self-pay | Admitting: Pulmonary Disease

## 2024-02-07 ENCOUNTER — Ambulatory Visit: Payer: Medicare (Managed Care) | Attending: Pulmonary Disease | Admitting: Cardiology

## 2024-02-07 VITALS — BP 122/80 | HR 82 | Ht 66.0 in | Wt 203.4 lb

## 2024-02-07 DIAGNOSIS — Z95810 Presence of automatic (implantable) cardiac defibrillator: Secondary | ICD-10-CM

## 2024-02-07 DIAGNOSIS — I428 Other cardiomyopathies: Secondary | ICD-10-CM

## 2024-02-07 DIAGNOSIS — I10 Essential (primary) hypertension: Secondary | ICD-10-CM | POA: Diagnosis not present

## 2024-02-07 DIAGNOSIS — I4729 Other ventricular tachycardia: Secondary | ICD-10-CM

## 2024-02-07 DIAGNOSIS — I4581 Long QT syndrome: Secondary | ICD-10-CM | POA: Diagnosis not present

## 2024-02-07 LAB — CUP PACEART INCLINIC DEVICE CHECK
Battery Remaining Longevity: 111 mo
Battery Voltage: 3.05 V
Brady Statistic AP VP Percent: 96.5 %
Brady Statistic AP VS Percent: 0.19 %
Brady Statistic AS VP Percent: 1.81 %
Brady Statistic AS VS Percent: 1.5 %
Brady Statistic RA Percent Paced: 98.03 %
Brady Statistic RV Percent Paced: 98.31 %
Date Time Interrogation Session: 20260102161319
HighPow Impedance: 91 Ohm
Implantable Lead Connection Status: 753985
Implantable Lead Connection Status: 753985
Implantable Lead Connection Status: 753985
Implantable Lead Implant Date: 20140129
Implantable Lead Implant Date: 20140129
Implantable Lead Implant Date: 20251002
Implantable Lead Location: 753858
Implantable Lead Location: 753859
Implantable Lead Location: 753860
Implantable Lead Model: 180
Implantable Lead Model: 4598
Implantable Lead Model: 5076
Implantable Lead Serial Number: 310373
Implantable Pulse Generator Implant Date: 20251002
Lead Channel Impedance Value: 1197 Ohm
Lead Channel Impedance Value: 304 Ohm
Lead Channel Impedance Value: 380 Ohm
Lead Channel Impedance Value: 475 Ohm
Lead Channel Impedance Value: 513 Ohm
Lead Channel Impedance Value: 589 Ohm
Lead Channel Impedance Value: 608 Ohm
Lead Channel Impedance Value: 608 Ohm
Lead Channel Impedance Value: 646 Ohm
Lead Channel Impedance Value: 665 Ohm
Lead Channel Impedance Value: 760 Ohm
Lead Channel Impedance Value: 798 Ohm
Lead Channel Impedance Value: 836 Ohm
Lead Channel Pacing Threshold Amplitude: 0.375 V
Lead Channel Pacing Threshold Amplitude: 0.5 V
Lead Channel Pacing Threshold Amplitude: 0.625 V
Lead Channel Pacing Threshold Pulse Width: 0.4 ms
Lead Channel Pacing Threshold Pulse Width: 0.4 ms
Lead Channel Pacing Threshold Pulse Width: 0.4 ms
Lead Channel Sensing Intrinsic Amplitude: 3 mV
Lead Channel Sensing Intrinsic Amplitude: 9.3 mV
Lead Channel Setting Pacing Amplitude: 1 V
Lead Channel Setting Pacing Amplitude: 1.75 V
Lead Channel Setting Pacing Amplitude: 2 V
Lead Channel Setting Pacing Pulse Width: 0.4 ms
Lead Channel Setting Pacing Pulse Width: 0.4 ms
Lead Channel Setting Sensing Sensitivity: 0.3 mV
Zone Setting Status: 755011
Zone Setting Status: 755011
Zone Setting Status: 755011

## 2024-02-07 NOTE — Patient Instructions (Signed)
 Medication Instructions:  None  *If you need a refill on your cardiac medications before your next appointment, please call your pharmacy*  Lab Work: BMP today If you have labs (blood work) drawn today and your tests are completely normal, you will receive your results only by: MyChart Message (if you have MyChart) OR A paper copy in the mail If you have any lab test that is abnormal or we need to change your treatment, we will call you to review the results.  Testing/Procedures: None   Follow-Up: At [facility], you and your health needs are our priority.  As part of our continuing mission to provide you with exceptional heart care, our providers are all part of one team.  This team includes your primary Cardiologist (physician) and Advanced Practice Providers or APPs (Physician Assistants and Nurse Practitioners) who all work together to provide you with the care you need, when you need it.  Your next appointment:   6 month(s)  Provider:   You will see one of the following Advanced Practice Providers on your designated Care Team:   Charlies Arthur, [place] Ozell Jodie Passey, PA-C Suzann Riddle, NP Daphne Barrack, NP Artist Pouch, PA-C   Referral to general cardiologist in 2 months.   We recommend signing up for the patient portal called MyChart.  Sign up information is provided on this After Visit Summary.  MyChart is used to connect with patients for Virtual Visits (Telemedicine).  Patients are able to view lab/test results, encounter notes, upcoming appointments, etc.  Non-urgent messages can be sent to your provider as well.   To learn more about what you can do with MyChart, go to [email or web address].   Other Instructions None

## 2024-02-07 NOTE — Progress Notes (Signed)
 " Electrophysiology Office Note:   ID:  Michelle, Castro 10/13/1950, MRN [id number]  Primary Cardiologist: None Electrophysiologist: Will [name], MD      History of Present Illness:   Michelle Castro is a 74 y.o. female with h/o chronic systolic heart failure, ventricular tachycardia, and long QT seen today for routine electrophysiology follow-up s/p Defibrillator implant.  Patient was seen by Dr. Norton in August of 2025 following an admission at [facility] with ventricular tachycardia. Her base pacing rate was increased to 80 bpm and she had minimal VT following reprogramming. At that visit with Dr. Norton, therapy zone reduced to 171 bpm. With non-ischemic cardiomyopathy (EF reduced from 45-50% to 25-30%), suspicion for RV pacing related reduced LVEF and decision made to pursue BiV upgrade. Patient then saw Jodie Passey PA-C on 10/17/23 after multiple episodes of NSVT. Detection lowered further to 162bpm with increased ATP attempts. She was also started on Mexiletine 150mg  BID.   On 11/07/23, patient had BiV upgrade with Dr. Norton. Following upgrade, remote transmissions showed 12 episodes of NSVT, no treated arrhythmias.   Since last being seen in our clinic the patient reports doing well. Denies symptoms of palpitations, says that she has more energy following BiV upgrade.  she denies chest pain, dyspnea, PND, orthopnea, nausea, vomiting, dizziness, syncope, edema, weight gain, or early satiety.    Review of systems complete and found to be negative unless listed in HPI.   EP Information / Studies Reviewed:    EKG is ordered today. Personal review as below.  EKG Interpretation Date/Time:  Friday February 07 2024 13:52:52 EST Ventricular Rate:  82 PR Interval:  168 QRS Duration:  154 QT Interval:  408 QTC Calculation: 476 R Axis:   261  Text Interpretation: AV dual-paced rhythm Biventricular pacemaker detected When compared with ECG of 07-Nov-2023 16:34, Vent. rate has  increased BY   2 BPM Confirmed by Trudy Birmingham [phone]) on 02/07/2024 3:09:41 PM    ICD Interrogation-  reviewed in detail today,  See PACEART report.  Arrhythmia/Device History MDT dual chamber ICD implanted 03/05/2012, gen change 09/23/2019. Mexiletine started 10/17/23 MDT BiV upgrade 11/07/23   Physical Exam:   VS:  BP 122/80   Pulse 82   Ht 5' 6 (1.676 m)   Wt 203 lb 6.4 oz (92.3 kg)   SpO2 96%   BMI 32.83 kg/m    Wt Readings from Last 3 Encounters:  02/07/24 203 lb 6.4 oz (92.3 kg)  11/07/23 200 lb (90.7 kg)  10/17/23 207 lb 12.8 oz (94.3 kg)     GEN: No acute distress  NECK: No JVD; No carotid bruits CARDIAC: Regular rate and rhythm, no murmurs, rubs, gallops RESPIRATORY:  Clear to auscultation without rales, wheezing or rhonchi  ABDOMEN: Soft, non-tender, non-distended EXTREMITIES:  No edema; No deformity   ASSESSMENT AND PLAN:    Ventricular arrhythmia with Long QT  s/p Medtronic CRT-D  euvolemic appearing today and per thoracic impedance Stable on an appropriate medical regimen Normal ICD function See Pace Art report Patient demonstrated intermittent 2:1 AV block, unable to update wavelet, programmed off after discussion with industry. Low PVC burden with only brief NSVT. Continue Mexiletine 150mg  BID. Update BMET today.   Chronic systolic HF Patient euvolemic appearing today. On good GDMT. Will update BMET today (creatinine rising on last check), and refer to general cardiology for ongoing management of GDMT.   Hypertension Blood pressure is well-controlled on current antihypertensive regimen. Continue current medications and dosing.  Disposition:   Follow up with EP Team in 6 months   Signed, Artist Pouch, PA-C  "

## 2024-02-08 ENCOUNTER — Ambulatory Visit: Payer: Self-pay | Admitting: Cardiology

## 2024-02-08 DIAGNOSIS — Z79899 Other long term (current) drug therapy: Secondary | ICD-10-CM

## 2024-02-08 LAB — BASIC METABOLIC PANEL WITH GFR
BUN/Creatinine Ratio: 20 (ref 12–28)
BUN: 33 mg/dL — ABNORMAL HIGH (ref 8–27)
CO2: 23 mmol/L (ref 20–29)
Calcium: 9.7 mg/dL (ref 8.7–10.3)
Chloride: 100 mmol/L (ref 96–106)
Creatinine, Ser: 1.68 mg/dL — ABNORMAL HIGH (ref 0.57–1.00)
Glucose: 85 mg/dL (ref 70–99)
Potassium: 5.3 mmol/L — ABNORMAL HIGH (ref 3.5–5.2)
Sodium: 137 mmol/L (ref 134–144)
eGFR: 32 mL/min/1.73 — ABNORMAL LOW

## 2024-02-11 NOTE — Telephone Encounter (Addendum)
 Called patient regarding results, patient verbalized understanding of results  ----- Message from Artist Pouch sent at 02/11/2024  9:42 AM EST ----- Labs show kidney function is slightly decreased from prior check. Potassium is also elevated. Please stop Spironolactone  (can cause elevated potassium). We will need to recheck a BMET in 1-2 weeks.  Artist Pouch, PA-C

## 2024-02-24 ENCOUNTER — Ambulatory Visit

## 2024-02-24 DIAGNOSIS — Z95810 Presence of automatic (implantable) cardiac defibrillator: Secondary | ICD-10-CM

## 2024-02-24 DIAGNOSIS — I5022 Chronic systolic (congestive) heart failure: Secondary | ICD-10-CM | POA: Diagnosis not present

## 2024-02-24 NOTE — Progress Notes (Unsigned)
 EPIC Encounter for ICM Monitoring  Patient Name: Michelle Castro is a 74 y.o. female Date: 02/24/2024 Primary Care Physican: Street, Lonni HERO, MD Primary Cardiologist:  [facility] Electrophysiologist: Camnitz BiV Pacing: 98.9% 04/26/2021 Office Weight: 210 lbs 12/26/2022 Weight: 201 lbs 04/09/2023 Weight: 201-202 lbs  05/16/2023 Weight: 197 lbs 08/16/2023 Office Weight: 200 bs 10/17/2023 Office Visit: 207 lbs 01/20/2024 Weight: 200 lbs   Time in AT/AF  <0.1 hours/day (<0.1 %)                                                Spoke with patient and heart failure questions reviewed.  Transmission results reviewed.  Pt asymptomatic for fluid accumulation.  Reports feeling well at this time and voices no complaints.       Diet:  02/25/2024 - She has been eating dorita chips.  Is not strict with fluid or salt intake.  Eats out once a week.   Since 01/20/2024 ICM Remote Transmission: Optivol thoracic impedance suggesting possible fluid accumulation since 02/15/2024.  Stopped Spironolactone  02/11/2024 which correlates with decreased impedance.    Prescribed:  Spironolactone  25 mg take 0.5 tablet (12.5 mg total) by mouth daily.  Stopped on 02/11/2024 due elevated potassium lab results   Recommendations:  Recommendation to limit salt intake to 2000 mg daily and fluid intake to 64 oz daily.  Encouraged to call if experiencing any fluid symptoms.    Follow-up plan: ICM clinic phone appointment on 03/02/2024 to recheck fluid levels.   91 day device clinic remote transmission 03/18/2024.     EP/Cardiology Office Visits:  04/09/2024 with Dr. Donley.  Next 6 month EP visit due 08/06/2024.    Copy of ICM check sent to Dr. Inocencio.      Remote monitoring is medically necessary for Heart Failure Management.    Daily Thoracic Impedance ICM trend: 11/25/2023 through 02/24/2024.    12-14 Month Thoracic Impedance ICM trend:     [name], RN 02/24/2024 8:49 AM

## 2024-03-02 ENCOUNTER — Ambulatory Visit: Attending: Cardiology

## 2024-03-02 DIAGNOSIS — Z95810 Presence of automatic (implantable) cardiac defibrillator: Secondary | ICD-10-CM

## 2024-03-02 DIAGNOSIS — I5022 Chronic systolic (congestive) heart failure: Secondary | ICD-10-CM

## 2024-03-02 NOTE — Progress Notes (Signed)
 EPIC Encounter for ICM Monitoring  Patient Name: Michelle Castro is a 74 y.o. female Date: 03/02/2024 Primary Care Physican: Street, Lonni HERO, MD Primary Cardiologist:  [facility] Electrophysiologist: Camnitz BiV Pacing: 98.2% 04/26/2021 Office Weight: 210 lbs 12/26/2022 Weight: 201 lbs 04/09/2023 Weight: 201-202 lbs  05/16/2023 Weight: 197 lbs 08/16/2023 Office Weight: 200 bs 10/17/2023 Office Visit: 207 lbs 01/20/2024 Weight: 200 lbs   Since 24-Feb-2024 Time in AT/AF 0.0 hours/day (0.0 %)                                              Spoke with patient and heart failure questions reviewed.  Transmission results reviewed.  Pt asymptomatic for fluid accumulation.She has cut back on her salt intake.   Diet:  02/25/2024 - She has been eating dorito chips.  Is not strict with fluid or salt intake.  Eats out once a week.   Since 02/24/2024 ICM Remote Transmission: Optivol thoracic impedance suggesting fluid levels have improved and trending close to baseline.  Stopped Spironolactone  02/11/2024 which correlates with decreased impedance.    Prescribed:  Spironolactone  25 mg take 0.5 tablet (12.5 mg total) by mouth daily.  Stopped on 02/11/2024 due elevated potassium lab results   Recommendations:  Advised to continue with limiting salt intake.  Encouraged to call if experiencing any fluid symptoms.    Follow-up plan: ICM clinic phone appointment on 03/26/2024.   91 day device clinic remote transmission 03/18/2024.     EP/Cardiology Office Visits:  04/09/2024 with Dr. Donley.  Next 6 month EP visit due 08/06/2024.    Copy of ICM check sent to Dr. Inocencio.     Remote monitoring is medically necessary for Heart Failure Management.    Daily Thoracic Impedance ICM trend: 12/02/2023 through 03/02/2024.    12-14 Month Thoracic Impedance ICM trend:     [name], RN 03/02/2024 1:22 PM

## 2024-03-04 NOTE — Progress Notes (Signed)
 31 day ICM Remote transmission canceled due to [facility] clinic is on hold until further notice.  91 day remote monitoring will continue per protocol.

## 2024-03-18 ENCOUNTER — Ambulatory Visit: Payer: Medicare HMO

## 2024-03-18 ENCOUNTER — Ambulatory Visit

## 2024-03-26 ENCOUNTER — Ambulatory Visit

## 2024-04-09 ENCOUNTER — Ambulatory Visit

## 2024-06-17 ENCOUNTER — Ambulatory Visit

## 2024-09-16 ENCOUNTER — Ambulatory Visit
